# Patient Record
Sex: Female | Born: 1935
Health system: Southern US, Community
[De-identification: ages and names within clinical notes are randomized; demographics above are authoritative.]

## PROBLEM LIST (undated history)

## (undated) DIAGNOSIS — I2699 Other pulmonary embolism without acute cor pulmonale: Secondary | ICD-10-CM

## (undated) DIAGNOSIS — I4821 Permanent atrial fibrillation: Secondary | ICD-10-CM

## (undated) DIAGNOSIS — I499 Cardiac arrhythmia, unspecified: Secondary | ICD-10-CM

## (undated) DIAGNOSIS — I5032 Chronic diastolic (congestive) heart failure: Secondary | ICD-10-CM

## (undated) DIAGNOSIS — I350 Nonrheumatic aortic (valve) stenosis: Secondary | ICD-10-CM

## (undated) DIAGNOSIS — I10 Essential (primary) hypertension: Secondary | ICD-10-CM

## (undated) DIAGNOSIS — E785 Hyperlipidemia, unspecified: Secondary | ICD-10-CM

## (undated) DIAGNOSIS — I82409 Acute embolism and thrombosis of unspecified deep veins of unspecified lower extremity: Secondary | ICD-10-CM

## (undated) DIAGNOSIS — K449 Diaphragmatic hernia without obstruction or gangrene: Secondary | ICD-10-CM

## (undated) HISTORY — DX: Chronic diastolic (congestive) heart failure: I50.32

## (undated) HISTORY — DX: Nonrheumatic aortic (valve) stenosis: I35.0

## (undated) HISTORY — PX: CHOLECYSTECTOMY: SHX55

## (undated) HISTORY — PX: ABDOMINAL HYSTERECTOMY: SHX81

## (undated) HISTORY — PX: CATARACT EXTRACTION: SUR2

## (undated) HISTORY — DX: Permanent atrial fibrillation: I48.21

---

## 2001-02-14 ENCOUNTER — Other Ambulatory Visit: Admission: RE | Admit: 2001-02-14 | Discharge: 2001-02-14 | Payer: Self-pay | Admitting: Family Medicine

## 2001-02-27 ENCOUNTER — Encounter: Payer: Self-pay | Admitting: Family Medicine

## 2001-02-27 ENCOUNTER — Encounter: Admission: RE | Admit: 2001-02-27 | Discharge: 2001-02-27 | Payer: Self-pay | Admitting: Family Medicine

## 2005-06-29 ENCOUNTER — Emergency Department (HOSPITAL_COMMUNITY): Admission: EM | Admit: 2005-06-29 | Discharge: 2005-06-29 | Payer: Self-pay | Admitting: *Deleted

## 2005-08-18 ENCOUNTER — Inpatient Hospital Stay (HOSPITAL_COMMUNITY): Admission: EM | Admit: 2005-08-18 | Discharge: 2005-08-21 | Payer: Self-pay | Admitting: Emergency Medicine

## 2005-10-15 ENCOUNTER — Other Ambulatory Visit: Admission: RE | Admit: 2005-10-15 | Discharge: 2005-10-15 | Payer: Self-pay | Admitting: Family Medicine

## 2005-10-24 ENCOUNTER — Encounter: Admission: RE | Admit: 2005-10-24 | Discharge: 2005-10-24 | Payer: Self-pay | Admitting: Family Medicine

## 2006-12-06 ENCOUNTER — Encounter: Admission: RE | Admit: 2006-12-06 | Discharge: 2006-12-06 | Payer: Self-pay | Admitting: Family Medicine

## 2007-09-05 ENCOUNTER — Emergency Department (HOSPITAL_COMMUNITY): Admission: EM | Admit: 2007-09-05 | Discharge: 2007-09-06 | Payer: Self-pay | Admitting: Emergency Medicine

## 2007-12-08 ENCOUNTER — Encounter: Admission: RE | Admit: 2007-12-08 | Discharge: 2007-12-08 | Payer: Self-pay | Admitting: Family Medicine

## 2008-01-12 ENCOUNTER — Ambulatory Visit (HOSPITAL_COMMUNITY): Admission: RE | Admit: 2008-01-12 | Discharge: 2008-01-12 | Payer: Self-pay | Admitting: Family Medicine

## 2009-01-07 ENCOUNTER — Encounter: Admission: RE | Admit: 2009-01-07 | Discharge: 2009-01-07 | Payer: Self-pay | Admitting: Internal Medicine

## 2010-07-30 ENCOUNTER — Encounter: Payer: Self-pay | Admitting: Family Medicine

## 2010-11-24 NOTE — H&P (Signed)
Mary Rice, Mary Rice                 ACCOUNT NO.:  [id number]   MEDICAL RECORD NO.:  [id number]          PATIENT TYPE:  INP   LOCATION:  0108                         FACILITY:  [facility]   PHYSICIAN:  Renato Battles, M.D.     DATE OF BIRTH:  February 22, 1933   DATE OF ADMISSION:  08/18/2005  DATE OF DISCHARGE:                                HISTORY & PHYSICAL   REASON FOR ADMISSION:  Shortness of breath.   PRIMARY CARE PHYSICIAN:  Dr. Purnell Shoemaker in [place]. The patient is unassigned at  this hospital.   ORTHOPEDIST:  Dr. Thomasena Edis.   HISTORY OF PRESENT ILLNESS:  The patient is a very pleasant 77 year old  white female with shortness of breath for the last four days. No chest pain,  no cough. The patient has reported that she had right lower extremity  sprain and knee injury which rendered her bedridden since June 29, 2005.  She recently visited her orthopedist who advised her to start ambulating and  bear weight on her injured leg as of August 12, 2005. The patient developed  swelling of the right lower extremity on August 14, 2005 as well as  shortness of breath and presented to emergency department for further  evaluation and management. Initial emergency room workup revealed bilateral  pulmonary emboli by chest CT angiogram.   REVIEW OF SYSTEMS:  CONSTITUTIONAL: No fevers, chills or night sweats. No  weight changes. GI: No nausea, vomiting. Positive for mild constipation. No  diarrhea. GU: No dysuria, hematuria or retention. CARDIOPULMONARY: No chest  pain, no orthopnea, no PND, no cough.   PAST MEDICAL HISTORY:  None.   PAST SURGICAL HISTORY:  None.   FAMILY HISTORY:  Positive for hypertension.   SOCIAL HISTORY:  No tobacco, alcohol or drugs. She lives with her husband in  the [place] area.   MEDICATIONS:  None.   ALLERGIES:  SULFA.   PHYSICAL EXAMINATION:  GENERAL: The patient is alert and oriented x3 in no  acute distress.  VITAL SIGNS: Temperature is 98.2, heart rate 116,  respiratory rate 18, blood  pressure 146/66. O2 saturation 94% on room air.  HEENT: The head is normocephalic and atraumatic. Pupils are equal, round,  and reactive to light and accommodation.  LYMPHADENOPATHY: No thyromegaly and no JVD.  CHEST: Clear to auscultation bilaterally. No wheezing, rales or rhonchi.  HEART: Regular rhythm, tachycardia. No murmurs.  ABDOMEN: Obese, soft, nontender, and nondistended. Normal active bowel  sounds.  EXTREMITIES: No cyanosis or clubbing. Right lower extremity is swollen.   ASSESSMENT/PLAN:  1.  Bilateral pulmonary emboli as a result of embolization: Start the      patient on Lovenox 1 mg/kg subcutaneously q.12h. Coumadin is to be      started 24 hours after initiation of Lovenox. Workup for      hypercoagulability will be initiated at this point as well.  2.  Right lower extremity angioedema: I am going to check a Doppler of the      right lower extremity. Raise lower extremity at night and consult      physical therapy and occupational  therapy.  3.  Health maintenance: The patient has not been seeing physicians for a      long, long time. I am going to start her on low dose aspirin, check      fasting lipids and rule out myocardial infarction with cardiac enzymes.      Renato Battles, M.D.  Electronically Signed     SA/MEDQ  D:  08/18/2005  T:  08/18/2005  Job:  [id number]   cc:   Thomasena Edis, M.D.

## 2010-11-24 NOTE — Discharge Summary (Signed)
NAMECELESTINE, PRIM                 ACCOUNT NO.:  [id number]   MEDICAL RECORD NO.:  [id number]          PATIENT TYPE:  INP   LOCATION:  1418                         FACILITY[facility]   PHYSICIAN:  Hettie Holstein, D.O.    DATE OF BIRTH:  10-20-1932   DATE OF ADMISSION:  08/18/2005  DATE OF DISCHARGE:  08/21/2005                                 DISCHARGE SUMMARY   PRIMARY CARE PHYSICIAN:  Lianne Bushy, M.D.   REASON FOR ADMISSION:  Shortness of breath.   DISCHARGE DIAGNOSES:  1.  Bilateral pulmonary emboli, status post initiation of low molecular      weight heparin, a bridge to therapeutic INR.  2.  Status post posterior tibial plateau fracture as well as anterior      cruciate ligament and medial collateral ligament tear, sustained in      December, 2006.  Managed by Dr. Valma Cava with recurrent      disposition of partial weightbearing with 0-90 degree range of motion      and plans for rehab therapy in the outpatient setting, as directed by      Dr. Valma Cava.  She does have an appointment to follow up on March      4th.  3.  Hyperlipidemia with LDL this admission of 179.   DISPOSITION:  Ms. Muscat's hospitalization has been quite uneventful,  remaining hemodynamically stable and ambulatory with a walker.  Without  respiratory distress.  She is felt to be medically stable to be discharged  home with close followup with her primary care physician, Dr. Lianne Bushy.  She is being sent on a Lovenox bridge.  In addition, she is to follow up  with Dr. Valma Cava in March, as previously directed.  She is provided  our phone number to contact if she has any questions regarding this  hospitalization.   DISCHARGE MEDICATIONS:  1.  Coumadin 5 mg daily, to be adjusted by her primary care physician, Dr.      Purnell Shoemaker.  She has a 10 a.m. appointment on Friday.  I contacted his office      and has notified them of his need to follow her up with regards to her      Coumadin  therapy.  2.  Lovenox 150 mg subcu q.24h. until her INR is therapeutic x48 hours, in      the 2-3 range.  She is instructed to have a PT/INR drawn on Thursday and      follow up with her primary care physician on Friday.  3.  Oxycodone 5 mg every 4 hours as needed for moderate pain.  4.  Tylenol 650 mg every 4 hours as needed for mild pain.  5.  Zocor 20 mg daily.  This was initiated at admission.  Her baseline LFTs      obtained at admission were within normal limits.  This can be followed      at the discretion of her primary care physician.  6.  Senokot daily, to hold if she develops diarrhea.   HISTORY OF PRESENT ILLNESS:  For full details, please refer to the H&P as  dictated by Dr. Renato Battles, but briefly, Ms. Gerlich is a pleasant 77-year-  old female who sustained posterior tibial plateau fracture late in December  and has been bedridden since that time.  Recently had seen her orthopedic  physician and reports some swelling of her lower extremity.  In any event,  it was discovered that she had bilateral pulmonary emboli in the emergency  department, as she had presented with shortness of breath, and with this  history and findings of suspicion.   HOSPITAL COURSE:  She was admitted and initiated on heparin therapy and  eventually transitioned to low molecular weight heparin and Coumadin.  She  is currently bridging to a therapeutic INR.  She has remained  hemodynamically stable throughout her hospital course, and respiratory  distress has resolved.  She does look quite well at this time.  We are going  to discharge her home with close followup with her primary care physician.  Have instructed her that if she feels any progressive shortness of breath or  cough, she should return to [facility] or [facility] emergency department.   LABORATORY DATA:  Her INR on the date of discharge was 1.3.  She has been on  Coumadin 5 mg q.h.s. since admission.  Will be following up with primary   care physician to further titrate this dose.  Discontinue Lovenox when  appropriate.      Hettie Holstein, D.O.  Electronically Signed     ESS/MEDQ  D:  08/21/2005  T:  08/21/2005  Job:  [id number]   cc:   Lianne Bushy, M.D.  Fax: [phone]

## 2010-12-05 ENCOUNTER — Other Ambulatory Visit: Payer: Self-pay | Admitting: Internal Medicine

## 2010-12-05 DIAGNOSIS — Z1231 Encounter for screening mammogram for malignant neoplasm of breast: Secondary | ICD-10-CM

## 2010-12-11 ENCOUNTER — Ambulatory Visit
Admission: RE | Admit: 2010-12-11 | Discharge: 2010-12-11 | Disposition: A | Discharge status: Home / Self Care | Payer: Medicare Other | Source: Ambulatory Visit | Attending: Internal Medicine | Admitting: Internal Medicine

## 2010-12-11 DIAGNOSIS — Z1231 Encounter for screening mammogram for malignant neoplasm of breast: Secondary | ICD-10-CM

## 2011-03-30 LAB — DIFFERENTIAL
Basophils Absolute: 0
Basophils Relative: 1
Eosinophils Relative: 1
Lymphs Abs: 0.8
Monocytes Absolute: 0.5

## 2011-03-30 LAB — CBC
Hemoglobin: 14.5
MCHC: 33.9
MCV: 90.3
RBC: 4.73
RDW: 13.6
WBC: 4

## 2011-03-30 LAB — BASIC METABOLIC PANEL
Calcium: 8.8
GFR calc non Af Amer: >60
Sodium: 135

## 2011-03-30 LAB — POCT CARDIAC MARKERS
CKMB, poc: <1 — ABNORMAL LOW
Operator id: 1192
Troponin i, poc: <0.05

## 2011-12-25 ENCOUNTER — Other Ambulatory Visit: Payer: Self-pay | Admitting: Internal Medicine

## 2011-12-25 DIAGNOSIS — Z1231 Encounter for screening mammogram for malignant neoplasm of breast: Secondary | ICD-10-CM

## 2012-01-16 ENCOUNTER — Ambulatory Visit
Admission: RE | Admit: 2012-01-16 | Discharge: 2012-01-16 | Disposition: A | Discharge status: Home / Self Care | Payer: Medicare Other | Source: Ambulatory Visit | Attending: Internal Medicine | Admitting: Internal Medicine

## 2012-01-16 DIAGNOSIS — Z1231 Encounter for screening mammogram for malignant neoplasm of breast: Secondary | ICD-10-CM

## 2012-12-08 ENCOUNTER — Other Ambulatory Visit: Payer: Self-pay

## 2012-12-08 DIAGNOSIS — Z1231 Encounter for screening mammogram for malignant neoplasm of breast: Secondary | ICD-10-CM

## 2013-01-16 ENCOUNTER — Ambulatory Visit
Admission: RE | Admit: 2013-01-16 | Discharge: 2013-01-16 | Disposition: A | Discharge status: Home / Self Care | Payer: Medicare Other | Source: Ambulatory Visit

## 2013-01-16 DIAGNOSIS — Z1231 Encounter for screening mammogram for malignant neoplasm of breast: Secondary | ICD-10-CM

## 2013-12-16 ENCOUNTER — Other Ambulatory Visit: Payer: Self-pay

## 2013-12-16 DIAGNOSIS — Z1231 Encounter for screening mammogram for malignant neoplasm of breast: Secondary | ICD-10-CM

## 2014-01-18 ENCOUNTER — Ambulatory Visit
Admission: RE | Admit: 2014-01-18 | Discharge: 2014-01-18 | Disposition: A | Discharge status: Home / Self Care | Payer: Medicare Other | Source: Ambulatory Visit

## 2014-01-18 DIAGNOSIS — Z1231 Encounter for screening mammogram for malignant neoplasm of breast: Secondary | ICD-10-CM

## 2014-03-04 ENCOUNTER — Ambulatory Visit: Payer: Self-pay | Admitting: Podiatry

## 2014-08-27 ENCOUNTER — Ambulatory Visit: Payer: Self-pay | Admitting: Ophthalmology

## 2014-08-27 DIAGNOSIS — I10 Essential (primary) hypertension: Secondary | ICD-10-CM

## 2014-09-02 ENCOUNTER — Ambulatory Visit: Payer: Self-pay | Admitting: Ophthalmology

## 2014-10-14 ENCOUNTER — Ambulatory Visit
Admit: 2014-10-14 | Disposition: A | Discharge status: Home / Self Care | Payer: Self-pay | Attending: Ophthalmology | Admitting: Ophthalmology

## 2014-10-14 LAB — POTASSIUM: Potassium: 4.4 mmol/L

## 2014-10-21 ENCOUNTER — Ambulatory Visit
Admit: 2014-10-21 | Disposition: A | Discharge status: Home / Self Care | Payer: Self-pay | Attending: Ophthalmology | Admitting: Ophthalmology

## 2014-11-07 NOTE — Op Note (Signed)
PATIENT NAME:  Mary Rice, Mary Rice MR#:  [id number] DATE OF BIRTH:  [birth year]  DATE OF PROCEDURE:  10/21/2014  PREOPERATIVE DIAGNOSIS:  Nuclear sclerotic cataract, left eye, diagnosis code H25.12.   POSTOPERATIVE DIAGNOSIS:  Nuclear sclerotic cataract, left eye, diagnosis code H25.12.  PROCEDURE:  Phacoemulsification with posterior chamber intraocular lens left eye, model SN60WF.    SURGEON:  Lyla Glassing, MD  INDICATIONS:  This is a 81 year old female with decreased vision in the left eye.  PROCEDURE:  The risks and benefits of cataract surgery were discussed at length with the patient, including bleeding, infection, retinal detachment, re-operation, diplopia, ptosis, loss of vision, and loss of the eye. Informed consent was obtained. On the day of surgery, several sets of preoperative medication were administered to the operative eye including 0.5% tetracaine,1% cyclopentolate, 10% phenylephrine, 0.5% ketorolac, 0.5% gatifloxacin, and 2% lidocaine .  The patient was taken to the operating room and sedated via IV sedation. Topical tetracaine was placed in the eye. The operative eye was prepped using a diluted 10% Betadine solution and then covered in sterile drapes leaving only the operative eye exposed. A Lieberman lid speculum was placed to provide exposure. Using 0.12 forceps and a sideport blade, a paracentesis was created. Then a mixture of BSS, preservative free lidocaine, and epinephrine was injected into the anterior chamber. Next, a 2.4 mm keratome blade was used to create a two-step full-thickness clear corneal incision temporally. The cystitome and Utrata forceps were used to create a continuous capsulorrhexis in the anterior lens capsule. BSS on a hydrodissection cannula was used to perform gentle hydrodissection. Phacoemulsification was then performed to remove the nucleus. Irrigation and aspiration was performed to remove the remaining cortical material. Provisc was injected to fill the  capsular bag and anterior chamber. A 25.5 diopter SN60WF intraocular lens was injected into the capsular bag. The Connor wand was used to rotate it into proper position in the capsular bag. Irrigation and aspiration was performed to remove the remaining Viscoelastic material from the eye. BSS on a 30-gauge cannula was used to hydrate the wound. An intracameral antibiotic was administered. The wounds were checked and found to be watertight. The lid speculum and drapes were carefully removed. Several drops of Vigamox were placed in the operative eye. The eye was covered with protective eyewear. The patient was taken to the recovery area in good condition. There were no complications.   ____________________________ Lyla Glassing, MD nm:bu D: 10/21/2014 12:00:11 ET T: 10/21/2014 13:57:47 ET JOB#: [id number]  cc: Lyla Glassing, MD, <Dictator> Lyla Glassing MD ELECTRONICALLY SIGNED 10/28/2014 8:44

## 2014-11-07 NOTE — Op Note (Signed)
PATIENT NAME:  Mary Rice, Mary Rice MR#:  [id number] DATE OF BIRTH:  Jun 14, 1933  DATE OF PROCEDURE:  09/02/2014  PREOPERATIVE DIAGNOSIS:  Nuclear sclerotic cataract, right eye.  POSTOPERATIVE DIAGNOSIS:  Nuclear sclerotic cataract, right eye.  PROCEDURE:  Phacoemulsification with posterior chamber intraocular lens right eye, model SN60WF  SURGEON:  Lyla Glassing, MD  INDICATIONS:  This is a 81 year old female with decreased vision in the right eye.  PROCEDURE:  The risks and benefits of cataract surgery were discussed at length with the patient, including bleeding, infection, retinal detachment, re-operation, diplopia, ptosis, loss of vision, and loss of the eye. Informed consent was obtained. On the day of surgery, several sets of preoperative medication were administered to the operative eye including 0.5% tetracaine,1% cyclopentolate, 10% phenylephrine, 0.5% ketorolac, 0.5% gatifloxacin, and 2% lidocaine .  The patient was taken to the operating room and sedated via IV sedation. Topical tetracaine was placed in the eye. The operative eye was prepped using a diluted 10% Betadine solution and then covered in sterile drapes leaving only the operative eye exposed. A Lieberman lid speculum was placed to provide exposure. Using 0.12 forceps and a sideport blade, a paracentesis was created. Then a mixture of BSS, preservative free lidocaine, and epinephrine was injected into the anterior chamber. Next, a 2.4 mm keratome blade was used to create a two-step full-thickness clear corneal incision temporally. The cystitome and Utrata forceps were used to create a continuous capsulorrhexis in the anterior lens capsule. BSS on a hydrodissection cannula was used to perform gentle hydrodissection. Phacoemulsification was then performed to remove the nucleus. Irrigation and aspiration was performed to remove the remaining cortical material. Provisc was injected to fill the capsular bag and anterior chamber. A 26.5  diopter SN60WF intraocular lens was injected into the capsular bag. The Connor wand was used to rotate it into proper position in the capsular bag. Irrigation and aspiration was performed to remove the remaining Viscoelastic material from the eye. BSS on a 30-gauge cannula was used to hydrate the wound. An intracameral antibiotic was administered. The wounds were checked and found to be watertight. The lid speculum and drapes were carefully removed. Several drops of Vigamox were placed in the operative eye. The eye was covered with protective eyewear. The patient was taken to the recovery area in good condition. There were no complications.  DIAGNOSIS CODE: H25.11, nuclear sclerotic cataract of the right eye.   ____________________________ Lyla Glassing, MD nm:am D: 09/02/2014 10:38:47 ET T: 09/02/2014 22:27:53 ET JOB#: [id number]  cc: Lyla Glassing, MD, <Dictator> Lyla Glassing MD ELECTRONICALLY SIGNED 09/09/2014 11:56

## 2014-12-16 ENCOUNTER — Other Ambulatory Visit: Payer: Self-pay

## 2014-12-16 DIAGNOSIS — Z1231 Encounter for screening mammogram for malignant neoplasm of breast: Secondary | ICD-10-CM

## 2015-01-14 ENCOUNTER — Ambulatory Visit
Admission: RE | Admit: 2015-01-14 | Discharge: 2015-01-14 | Disposition: A | Discharge status: Home / Self Care | Payer: Medicare Other | Source: Ambulatory Visit

## 2015-01-14 DIAGNOSIS — Z1231 Encounter for screening mammogram for malignant neoplasm of breast: Secondary | ICD-10-CM

## 2015-08-19 ENCOUNTER — Emergency Department (INDEPENDENT_AMBULATORY_CARE_PROVIDER_SITE_OTHER)
Admission: EM | Admit: 2015-08-19 | Discharge: 2015-08-19 | Disposition: A | Discharge status: Nursing Home (Includes ICF) | Payer: Medicare Other | Source: Home / Self Care | Attending: Emergency Medicine | Admitting: Emergency Medicine

## 2015-08-19 ENCOUNTER — Encounter (HOSPITAL_COMMUNITY)
Admission: EM | Disposition: A | Discharge status: Home / Self Care | Payer: Self-pay | Source: Home / Self Care | Attending: Emergency Medicine

## 2015-08-19 ENCOUNTER — Encounter (HOSPITAL_COMMUNITY): Payer: Self-pay | Admitting: *Deleted

## 2015-08-19 ENCOUNTER — Ambulatory Visit (HOSPITAL_COMMUNITY)
Admission: EM | Admit: 2015-08-19 | Discharge: 2015-08-19 | Disposition: A | Discharge status: Home / Self Care | Payer: Medicare Other | Attending: Emergency Medicine | Admitting: Emergency Medicine

## 2015-08-19 ENCOUNTER — Encounter (HOSPITAL_COMMUNITY): Payer: Self-pay | Admitting: Emergency Medicine

## 2015-08-19 DIAGNOSIS — X58XXXA Exposure to other specified factors, initial encounter: Secondary | ICD-10-CM | POA: Insufficient documentation

## 2015-08-19 DIAGNOSIS — Z86711 Personal history of pulmonary embolism: Secondary | ICD-10-CM | POA: Insufficient documentation

## 2015-08-19 DIAGNOSIS — Z86718 Personal history of other venous thrombosis and embolism: Secondary | ICD-10-CM | POA: Diagnosis not present

## 2015-08-19 DIAGNOSIS — T18128A Food in esophagus causing other injury, initial encounter: Secondary | ICD-10-CM | POA: Insufficient documentation

## 2015-08-19 DIAGNOSIS — T189XXA Foreign body of alimentary tract, part unspecified, initial encounter: Secondary | ICD-10-CM | POA: Diagnosis not present

## 2015-08-19 DIAGNOSIS — I10 Essential (primary) hypertension: Secondary | ICD-10-CM | POA: Diagnosis not present

## 2015-08-19 DIAGNOSIS — Z7982 Long term (current) use of aspirin: Secondary | ICD-10-CM | POA: Insufficient documentation

## 2015-08-19 DIAGNOSIS — E785 Hyperlipidemia, unspecified: Secondary | ICD-10-CM | POA: Diagnosis not present

## 2015-08-19 HISTORY — DX: Acute embolism and thrombosis of unspecified deep veins of unspecified lower extremity: I82.409

## 2015-08-19 HISTORY — PX: ESOPHAGOGASTRODUODENOSCOPY: SHX5428

## 2015-08-19 HISTORY — DX: Other pulmonary embolism without acute cor pulmonale: I26.99

## 2015-08-19 HISTORY — DX: Diaphragmatic hernia without obstruction or gangrene: K44.9

## 2015-08-19 HISTORY — DX: Essential (primary) hypertension: I10

## 2015-08-19 HISTORY — DX: Hyperlipidemia, unspecified: E78.5

## 2015-08-19 LAB — BASIC METABOLIC PANEL
ANION GAP: 10 (ref 5–15)
BUN: 15 mg/dL (ref 6–20)
CHLORIDE: 101 mmol/L (ref 101–111)
CO2: 27 mmol/L (ref 22–32)
CREATININE: 1.15 mg/dL — AB (ref 0.44–1.00)
Calcium: 9.5 mg/dL (ref 8.9–10.3)
GFR calc non Af Amer: 44 mL/min — ABNORMAL LOW (ref >60)
GFR, EST AFRICAN AMERICAN: 51 mL/min — AB (ref >60)
Glucose, Bld: 127 mg/dL — ABNORMAL HIGH (ref 65–99)
POTASSIUM: 5.3 mmol/L — AB (ref 3.5–5.1)
SODIUM: 138 mmol/L (ref 135–145)

## 2015-08-19 LAB — CBC WITH DIFFERENTIAL/PLATELET
BASOS PCT: 1 %
Basophils Absolute: 0.1 10*3/uL (ref 0.0–0.1)
EOS ABS: 0.2 10*3/uL (ref 0.0–0.7)
Eosinophils Relative: 2 %
HCT: 44.5 % (ref 36.0–46.0)
Hemoglobin: 15.3 g/dL — ABNORMAL HIGH (ref 12.0–15.0)
Lymphocytes Relative: 12 %
Lymphs Abs: 1 10*3/uL (ref 0.7–4.0)
MCH: 32 pg (ref 26.0–34.0)
MCHC: 34.4 g/dL (ref 30.0–36.0)
MCV: 93.1 fL (ref 78.0–100.0)
MONOS PCT: 9 %
Monocytes Absolute: 0.7 10*3/uL (ref 0.1–1.0)
NEUTROS PCT: 76 %
Neutro Abs: 6.4 10*3/uL (ref 1.7–7.7)
PLATELETS: 184 10*3/uL (ref 150–400)
RBC: 4.78 MIL/uL (ref 3.87–5.11)
RDW: 13.2 % (ref 11.5–15.5)
WBC: 8.3 10*3/uL (ref 4.0–10.5)

## 2015-08-19 SURGERY — EGD (ESOPHAGOGASTRODUODENOSCOPY)
Anesthesia: Moderate Sedation

## 2015-08-19 MED ORDER — MIDAZOLAM HCL 10 MG/2ML IJ SOLN
INTRAMUSCULAR | Status: DC | PRN
  Administered 2015-08-19 (×2): 2 mg via INTRAVENOUS

## 2015-08-19 MED ORDER — FENTANYL CITRATE (PF) 100 MCG/2ML IJ SOLN
INTRAMUSCULAR | Status: DC | PRN
  Administered 2015-08-19 (×2): 25 ug via INTRAVENOUS

## 2015-08-19 MED ORDER — MIDAZOLAM HCL 5 MG/ML IJ SOLN
INTRAMUSCULAR | Status: AC
  Filled 2015-08-19: qty 2

## 2015-08-19 MED ORDER — FENTANYL CITRATE (PF) 100 MCG/2ML IJ SOLN
INTRAMUSCULAR | Status: AC
  Filled 2015-08-19: qty 2

## 2015-08-19 NOTE — ED Notes (Signed)
No airway obstruction or difficulty

## 2015-08-19 NOTE — ED Provider Notes (Signed)
CSN: [id number]     Arrival date & time 08/19/15  1750 History   First MD Initiated Contact with Patient 08/19/15 1837     Chief Complaint  Patient presents with  . Aspiration   (Consider location/radiation/quality/duration/timing/severity/associated sxs/prior Treatment) HPI  She is an 82 year old woman here for evaluation of globus sensation. She states she is eating a piece of steak this afternoon at 3 PM when she choked on a piece. She is able to breathe without difficulty, but has had difficulty swallowing since then. She states she has to spit frequently. She has tried eating and drinking and states it comes back up. She states it feels like it is stuck just above the sternal notch.  Past Medical History  Diagnosis Date  . Hypertension   . Hyperlipemia   . Hiatal hernia   . Pulmonary embolism ([place])   . DVT (deep venous thrombosis) [facility])    Past Surgical History  Procedure Laterality Date  . Abdominal hysterectomy    . Cholecystectomy    . Cataract extraction     History reviewed. No pertinent family history. Social History  Substance Use Topics  . Smoking status: Never Smoker   . Smokeless tobacco: None  . Alcohol Use: No   OB History    No data available     Review of Systems As in history of present illness Allergies  Sulfa antibiotics  Home Medications   Prior to Admission medications   Medication Sig Start Date End Date Taking? Authorizing Provider  aspirin 81 MG tablet Take 81 mg by mouth daily.   Yes Historical Provider, MD  atenolol (TENORMIN) 25 MG tablet Take by mouth daily.   Yes Historical Provider, MD  furosemide (LASIX) 20 MG tablet Take 20 mg by mouth as needed.   Yes Historical Provider, MD  simvastatin (ZOCOR) 80 MG tablet Take 80 mg by mouth daily.   Yes Historical Provider, MD  telmisartan (MICARDIS) 80 MG tablet Take 80 mg by mouth daily.   Yes Historical Provider, MD   Meds Ordered and Administered this Visit  Medications - No data to  display  BP 192/97 mmHg  Pulse 80  Temp(Src) 98.1 F (36.7 C) (Oral)  Resp 16  SpO2 97% No data found.   Physical Exam  Constitutional: She is oriented to person, place, and time. She appears well-developed and well-nourished. No distress.  HENT:  Oropharynx nonerythematous and widely patent. No foreign body seen.  Neck: Neck supple.  Cardiovascular: Normal rate.   Pulmonary/Chest: Effort normal.  Neurological: She is alert and oriented to person, place, and time.    ED Course  Procedures (including critical care time)  Labs Review Labs Reviewed - No data to display  Imaging Review No results found.   MDM   1. Swallowed foreign body, initial encounter    Called and spoke with GI physician on call, Dr. Silverio Decamp, who recommends transfer to [facility] ER for treatment with glucagon versus endoscopy. She will plan on seeing the patient in the ER. Discussed with patient and her daughter. They will drive themselves to the emergency room.  She is stable without any respiratory compromise.    Melony Overly, MD 08/19/15 [phone]

## 2015-08-19 NOTE — Op Note (Signed)
Brief Op report (EGD)  Mucosal tear with minimal oozing in the distal esophagus noted near EG junction likely the site of food impaction. No evidence of obstructed food bolus in the esophagus. Stomach filled with fluid and food, scope was not advanced further due to risk of aspiration.   Recommendations:  Protonix 40mg  daily, 30 mins before breakfast Carafate before meals as needed Will arrange for repeat EGD with possible dilation in 4-6 weeks at [facility] Avoid meat and bread until the repeat EGD with dilation Ok to discharge home

## 2015-08-19 NOTE — ED Notes (Addendum)
Pt taken back to room and into a gown with this RN. GI MD and RN in room for procedure. IV supplies given to endoscopy RN. Pt signed consent for procedure and placed at bedside. Airway in tact.

## 2015-08-19 NOTE — ED Notes (Signed)
Pt verbalized understanding of d/c instructions and has no further questions. Pt stable and NAD. Pt d/c home with daughter driving. GCS 15 and pt is alert and oriented x 4.

## 2015-08-19 NOTE — ED Notes (Signed)
The pt has had steak stuck in her esophagus since 1500 today  She has had problems in the past swallowing  Food  But never unable to get the food stuck unstuck

## 2015-08-19 NOTE — ED Notes (Signed)
Endoscopy complete. Pt is to be monitored in the ED and the plan is to d/c home with daughter.

## 2015-08-19 NOTE — ED Provider Notes (Signed)
MSE was initiated and I personally evaluated the patient and placed orders (if any) at  9:18 PM on August 19, 2015.  The patient appears stable so that the remainder of the MSE may be completed by another provider.  Sent from urgent care to see GI. Has reported meat impaction. Awake and pleasant. Dr. Silverio Decamp will see the patient in the ER.  Davonna Belling, MD 08/19/15 2118

## 2015-08-19 NOTE — ED Notes (Signed)
The patient presented to the [facility] with a complaint of a possible food bolus (steak) stuck in her throat since 3 pm today. The patient denied any respiratory compromise.

## 2015-08-19 NOTE — ED Notes (Signed)
Pt attempted to swallow water and stated "it's painful to swallow the piece of steak is still there"

## 2015-08-19 NOTE — H&P (Signed)
[facility] History and Physical   Primary Care Physician:  No primary care provider on file.   Reason for Procedure:  Food impaction Plan:    EGD with possible intervention    HPI: Mary Rice is a 82 y.o. female presented to ER with c/o chocking on a piece of steak. She initially was having trouble with handling secretions but appears to be able saliva and water but continues to have some discomfort in the chest. Will proceed with EGD to evaluate and disimpact. The risks and benefits as well as alternatives of endoscopic procedure(s) have been discussed and reviewed. All questions answered. The patient agrees to proceed. Denies any nausea,  abdominal pain, melena or bright red blood per rectum     Past Medical History  Diagnosis Date  . Hypertension   . Hyperlipemia   . Hiatal hernia   . Pulmonary embolism ([place])   . DVT (deep venous thrombosis) [facility])     Past Surgical History  Procedure Laterality Date  . Abdominal hysterectomy    . Cholecystectomy    . Cataract extraction      Prior to Admission medications   Medication Sig Start Date End Date Taking? Authorizing Provider  aspirin 81 MG tablet Take 81 mg by mouth daily.    Historical Provider, MD  atenolol (TENORMIN) 25 MG tablet Take by mouth daily.    Historical Provider, MD  furosemide (LASIX) 20 MG tablet Take 20 mg by mouth as needed.    Historical Provider, MD  simvastatin (ZOCOR) 80 MG tablet Take 80 mg by mouth daily.    Historical Provider, MD  telmisartan (MICARDIS) 80 MG tablet Take 80 mg by mouth daily.    Historical Provider, MD    No current facility-administered medications for this encounter.   Current Outpatient Prescriptions  Medication Sig Dispense Refill  . aspirin 81 MG tablet Take 81 mg by mouth daily.    Marland Kitchen atenolol (TENORMIN) 25 MG tablet Take by mouth daily.    . furosemide (LASIX) 20 MG tablet Take 20 mg by mouth as needed.    . simvastatin (ZOCOR) 80 MG tablet Take 80 mg by  mouth daily.    Marland Kitchen telmisartan (MICARDIS) 80 MG tablet Take 80 mg by mouth daily.      Allergies as of 08/19/2015 - Review Complete 08/19/2015  Allergen Reaction Noted  . Sulfa antibiotics  08/19/2015    No family history on file.  Social History   Social History  . Marital Status: Married    Spouse Name: N/A  . Number of Children: N/A  . Years of Education: N/A   Occupational History  . Not on file.   Social History Main Topics  . Smoking status: Never Smoker   . Smokeless tobacco: Not on file  . Alcohol Use: No  . Drug Use: Not on file  . Sexual Activity: Not on file   Other Topics Concern  . Not on file   Social History Narrative    Review of Systems:  All other review of systems negative except as mentioned in the HPI.  Physical Exam: Vital signs in last 24 hours: Temp:  [98.1 F (36.7 C)] 98.1 F (36.7 C) (02/10 2013) Pulse Rate:  [80-95] 95 (02/10 2013) Resp:  [16] 16 (02/10 2013) BP: (153-192)/(88-97) 153/88 mmHg (02/10 2013) SpO2:  [97 %] 97 % (02/10 2013)   General:   Alert,  Well-developed, well-nourished, pleasant and cooperative in NAD Lungs:  Clear throughout to auscultation.  Heart:  Regular rate and rhythm; no murmurs, clicks, rubs,  or gallops. Abdomen:  Soft, nontender and nondistended. Normal bowel sounds.   Neuro/Psych:  Alert and cooperative. Normal mood and affect. A and O x 3   @K .Denzil Magnuson, MD [facility] [phone] (pager) 08/19/2015 9:27 PM@

## 2015-08-19 NOTE — ED Notes (Signed)
Water will not go down and stay down

## 2015-08-19 NOTE — ED Notes (Signed)
Patient came from Urgent Care.  They advised the patient has a "food bolus" and is gagging.  UC advised they called the GI doctor and they wanted glucagon given.  Patient is alert, oriented x4.

## 2015-08-22 ENCOUNTER — Telehealth: Payer: Self-pay

## 2015-08-22 ENCOUNTER — Encounter (HOSPITAL_COMMUNITY): Payer: Self-pay | Admitting: Gastroenterology

## 2015-08-22 NOTE — Telephone Encounter (Signed)
-----   Message from Mauri Pole, MD sent at 08/20/2015  6:15 PM EST ----- Needs repeat EGD in 4-6 weeks, ok to schedule at [facility]. Can you please have her follow up with extender prior to that.  Thanks [employer]

## 2015-08-22 NOTE — Telephone Encounter (Signed)
I have left message for the patient to call back. I have her scheduled to see Nicoletta Ba, PA-C on 09/09/15 at 3PM. I have made an appointment for her in the [facility] on 09/23/15 at [place].

## 2015-08-23 NOTE — Telephone Encounter (Signed)
ok 

## 2015-08-23 NOTE — Telephone Encounter (Signed)
Patient declines these appointments.

## 2015-09-09 ENCOUNTER — Ambulatory Visit: Payer: Medicare Other | Admitting: Physician Assistant

## 2015-09-23 ENCOUNTER — Encounter: Payer: Medicare Other | Admitting: Gastroenterology

## 2015-10-10 ENCOUNTER — Encounter (INDEPENDENT_AMBULATORY_CARE_PROVIDER_SITE_OTHER): Payer: Self-pay

## 2015-10-10 ENCOUNTER — Telehealth: Payer: Self-pay

## 2015-10-10 ENCOUNTER — Ambulatory Visit (INDEPENDENT_AMBULATORY_CARE_PROVIDER_SITE_OTHER): Payer: Medicare Other | Admitting: Cardiovascular Disease

## 2015-10-10 ENCOUNTER — Encounter: Payer: Self-pay | Admitting: Cardiovascular Disease

## 2015-10-10 VITALS — BP 100/60 | HR 60 | Ht 67.0 in | Wt 233.0 lb

## 2015-10-10 DIAGNOSIS — I10 Essential (primary) hypertension: Secondary | ICD-10-CM | POA: Diagnosis not present

## 2015-10-10 DIAGNOSIS — E669 Obesity, unspecified: Secondary | ICD-10-CM

## 2015-10-10 DIAGNOSIS — E785 Hyperlipidemia, unspecified: Secondary | ICD-10-CM | POA: Insufficient documentation

## 2015-10-10 DIAGNOSIS — I4891 Unspecified atrial fibrillation: Secondary | ICD-10-CM | POA: Insufficient documentation

## 2015-10-10 DIAGNOSIS — R0602 Shortness of breath: Secondary | ICD-10-CM | POA: Diagnosis not present

## 2015-10-10 MED ORDER — APIXABAN 5 MG PO TABS: 5.0000 mg | ORAL_TABLET | Freq: Two times a day (BID) | ORAL | Status: DC

## 2015-10-10 MED ORDER — OMEPRAZOLE 20 MG PO CPDR: 20.0000 mg | DELAYED_RELEASE_CAPSULE | Freq: Every day | ORAL | Status: DC

## 2015-10-10 NOTE — Progress Notes (Signed)
Patient ID: Mary Rice, female    DOB: 1933/04/13, 82 y.o.   MRN: [id number]  HPI Comments: Mary Rice is a pleasant 82 year old woman, referred to the cardiology clinic by Dr. Emily Filbert for evaluation of paroxysmal atrial fibrillation. She has a history of reactive airway disease, hypertension, DVT with PE after a patella fracture  She reports having EGD 08/19/2015 and daughter reports that on the monitor heart rate was going between 60 and 80, seemed to go up and down She was seen 09/20/2015 by Dr. Sabra Heck, was told that EKG showed atrial fibrillation and was referred for follow-up Told to increase her aspirin from 81 mg up to 325 mg  She is sedentary, likes to sit in the middle of the couch all day No regular exercise program Weight has been trending upwards Tolerating beta blocker, ARB, cholesterol medication, blood pressure well controlled  Recent EGD showing gastritis, was told to start PPI She is only taking Pepcid  She denies any significant chest pain on exertion Has never been told that she has atrial fibrillation in the past Denies any symptoms of palpitations, reports that she is asymptomatic  She declined EKG on today's visit (clinical exam suggestive of sinus rhythm) EKG from Dr. Sabra Heck was faxed over, baseline artifact, difficult to determine but appears to show normal sinus rhythm with second-degree AV block Type I     Allergies  Allergen Reactions  . Sulfa Antibiotics Rash     Medication List       aspirin 325 MG tablet  Take 325 mg by mouth daily.     atenolol 25 MG tablet  Commonly known as:  TENORMIN  Take 25 mg by mouth daily.     CALCIUM 600 PO  Take by mouth daily.     hydrochlorothiazide 25 MG tablet  Commonly known as:  HYDRODIURIL  Take 25 mg by mouth daily.     multivitamin tablet  Take 1 tablet by mouth daily.     omeprazole 20 MG capsule  Commonly known as:  PRILOSEC  Take 1 capsule (20 mg total) by mouth daily.     simvastatin  80 MG tablet  Commonly known as:  ZOCOR  Take 80 mg by mouth daily.     telmisartan 80 MG tablet  Commonly known as:  MICARDIS  Take 80 mg by mouth daily.     Vitamin D 2000 units tablet  Take 2,000 Units by mouth daily.        Past Medical History  Diagnosis Date  . Hypertension   . Hyperlipemia   . Hiatal hernia   . Pulmonary embolism ([place])   . DVT (deep venous thrombosis) [facility])     Past Surgical History  Procedure Laterality Date  . Abdominal hysterectomy    . Cholecystectomy    . Cataract extraction    . Esophagogastroduodenoscopy N/A 08/19/2015    Procedure: ESOPHAGOGASTRODUODENOSCOPY (EGD);  Surgeon: Mauri Pole, MD;  Location: [facility] ENDOSCOPY;  Service: Endoscopy;  Laterality: N/A;    Social History  reports that she has never smoked. She does not have any smokeless tobacco history on file. She reports that she does not drink alcohol or use illicit drugs.  Family History family history includes Heart failure in her mother.   Review of Systems  Constitutional: Negative.   Respiratory: Negative.   Cardiovascular: Negative.   Gastrointestinal: Negative.   Musculoskeletal: Negative.   Neurological: Negative.   Hematological: Negative.   Psychiatric/Behavioral: Negative.   All other  systems reviewed and are negative.   BP 100/60 mmHg  Pulse 60  Ht 5\' 7"  (1.702 m)  Wt 233 lb (105.688 kg)  BMI 36.48 kg/m2  Physical Exam  Constitutional: She is oriented to person, place, and time. She appears well-developed and well-nourished.  HENT:  Head: Normocephalic.  Nose: Nose normal.  Mouth/Throat: Oropharynx is clear and moist.  Eyes: Conjunctivae are normal. Pupils are equal, round, and reactive to light.  Neck: Normal range of motion. Neck supple. No JVD present.  Cardiovascular: Normal rate, regular rhythm and intact distal pulses.  Exam reveals no gallop and no friction rub.   Murmur heard.  Systolic murmur is present with a grade of 1/6   Pulmonary/Chest: Effort normal and breath sounds normal. No respiratory distress. She has no wheezes. She has no rales. She exhibits no tenderness.  Abdominal: Soft. Bowel sounds are normal. She exhibits no distension. There is no tenderness.  Musculoskeletal: Normal range of motion. She exhibits no edema or tenderness.  Lymphadenopathy:    She has no cervical adenopathy.  Neurological: She is alert and oriented to person, place, and time. Coordination normal.  Skin: Skin is warm and dry. No rash noted. No erythema.  Psychiatric: She has a normal mood and affect. Her behavior is normal. Judgment and thought content normal.

## 2015-10-10 NOTE — Telephone Encounter (Signed)
Left message for Levada Dy to call back.  EKG from PCP does not show clear rhythm, Dr. Rockey Situ would like to repeat.

## 2015-10-10 NOTE — Patient Instructions (Addendum)
You are doing well. You are in atrial fibrillation  Omeprazole for stomach (acid reducer) Start eliquis one pill twice a day Stop the aspirin  Check the price of other blood thinner pills, Xarelto or pradaxa  We will order an echocardiogram for atrial fibrillation Echocardiography is a painless test that uses sound waves to create images of your heart. It provides your doctor with information about the size and shape of your heart and how well your heart's chambers and valves are working. This procedure takes approximately one hour. There are no restrictions for this procedure.  Date & time: ___________________________  We will wait for the EKG to confirm atrial fib before starting a blood thinner    Please call us if you have new issues that need to be addressed before your next appt.  Your physician wants you to follow-up in: 6 weeks You will receive a reminder letter in the mail two months in advance. If you don't receive a letter, please call our office to schedule the follow-up appointment.  Echocardiogram An echocardiogram, or echocardiography, uses sound waves (ultrasound) to produce an image of your heart. The echocardiogram is simple, painless, obtained within a short period of time, and offers valuable information to your health care provider. The images from an echocardiogram can provide information such as:  Evidence of coronary artery disease (CAD).  Heart size.  Heart muscle function.  Heart valve function.  Aneurysm detection.  Evidence of a past heart attack.  Fluid buildup around the heart.  Heart muscle thickening.  Assess heart valve function. LET [facility] CARE PROVIDER KNOW ABOUT:  Any allergies you have.  All medicines you are taking, including vitamins, herbs, eye drops, creams, and over-the-counter medicines.  Previous problems you or members of your family have had with the use of anesthetics.  Any blood disorders you have.  Previous  surgeries you have had.  Medical conditions you have.  Possibility of pregnancy, if this applies. BEFORE THE PROCEDURE  No special preparation is needed. Eat and drink normally.  PROCEDURE   In order to produce an image of your heart, gel will be applied to your chest and a wand-like tool (transducer) will be moved over your chest. The gel will help transmit the sound waves from the transducer. The sound waves will harmlessly bounce off your heart to allow the heart images to be captured in real-time motion. These images will then be recorded.  You may need an IV to receive a medicine that improves the quality of the pictures. AFTER THE PROCEDURE You may return to your normal schedule including diet, activities, and medicines, unless your health care provider tells you otherwise.   This information is not intended to replace advice given to you by your health care provider. Make sure you discuss any questions you have with your health care provider.   Document Released: 06/22/2000 Document Revised: 07/16/2014 Document Reviewed: 03/02/2013 Elsevier Interactive Patient Education 2016 Elsevier [employer] Atrial Fibrillation Atrial fibrillation is a type of irregular or rapid heartbeat (arrhythmia). In atrial fibrillation, the heart quivers continuously in a chaotic pattern. This occurs when parts of the heart receive disorganized signals that make the heart unable to pump blood normally. This can increase the risk for stroke, heart failure, and other heart-related conditions. There are different types of atrial fibrillation, including:  Paroxysmal atrial fibrillation. This type starts suddenly, and it usually stops on its own shortly after it starts.  Persistent atrial fibrillation. This type often lasts longer than a  week. It may stop on its own or with treatment.  Long-lasting persistent atrial fibrillation. This type lasts longer than 12 months.  Permanent atrial fibrillation. This type does  not go away. Talk with your health care provider to learn about the type of atrial fibrillation that you have. CAUSES This condition is caused by some heart-related conditions or procedures, including:  A heart attack.  Coronary artery disease.  Heart failure.  Heart valve conditions.  High blood pressure.  Inflammation of the sac that surrounds the heart (pericarditis).  Heart surgery.  Certain heart rhythm disorders, such as Wolf-Parkinson-White syndrome. Other causes include:  Pneumonia.  Obstructive sleep apnea.  Blockage of an artery in the lungs (pulmonary embolism, or PE).  Lung cancer.  Chronic lung disease.  Thyroid problems, especially if the thyroid is overactive (hyperthyroidism).  Caffeine.  Excessive alcohol use or illegal drug use.  Use of some medicines, including certain decongestants and diet pills. Sometimes, the cause cannot be found. RISK FACTORS This condition is more likely to develop in:  People who are older in age.  People who smoke.  People who have diabetes mellitus.  People who are overweight (obese).  Athletes who exercise vigorously. SYMPTOMS Symptoms of this condition include:  A feeling that your heart is beating rapidly or irregularly.  A feeling of discomfort or pain in your chest.  Shortness of breath.  Sudden light-headedness or weakness.  Getting tired easily during exercise. In some cases, there are no symptoms. DIAGNOSIS Your health care provider may be able to detect atrial fibrillation when taking your pulse. If detected, this condition may be diagnosed with:  An electrocardiogram (ECG).  A Holter monitor test that records your heartbeat patterns over a 24-hour period.  Transthoracic echocardiogram (TTE) to evaluate how blood flows through your heart.  Transesophageal echocardiogram (TEE) to view more detailed images of your heart.  A stress test.  Imaging tests, such as a CT scan or chest  X-ray.  Blood tests. TREATMENT The main goals of treatment are to prevent blood clots from forming and to keep your heart beating at a normal rate and rhythm. The type of treatment that you receive depends on many factors, such as your underlying medical conditions and how you feel when you are experiencing atrial fibrillation. This condition may be treated with:  Medicine to slow down the heart rate, bring the heart's rhythm back to normal, or prevent clots from forming.  Electrical cardioversion. This is a procedure that resets your heart's rhythm by delivering a controlled, low-energy shock to the heart through your skin.  Different types of ablation, such as catheter ablation, catheter ablation with pacemaker, or surgical ablation. These procedures destroy the heart tissues that send abnormal signals. When the pacemaker is used, it is placed under your skin to help your heart beat in a regular rhythm. HOME CARE INSTRUCTIONS  Take over-the counter and prescription medicines only as told by your health care provider.  If your health care provider prescribed a blood-thinning medicine (anticoagulant), take it exactly as told. Taking too much blood-thinning medicine can cause bleeding. If you do not take enough blood-thinning medicine, you will not have the protection that you need against stroke and other problems.  Do not use tobacco products, including cigarettes, chewing tobacco, and e-cigarettes. If you need help quitting, ask your health care provider.  If you have obstructive sleep apnea, manage your condition as told by your health care provider.  Do not drink alcohol.  Do not drink  beverages that contain caffeine, such as coffee, soda, and tea.  Maintain a healthy weight. Do not use diet pills unless your health care provider approves. Diet pills may make heart problems worse.  Follow diet instructions as told by your health care provider.  Exercise regularly as told by your  health care provider.  Keep all follow-up visits as told by your health care provider. This is important. PREVENTION  Avoid drinking beverages that contain caffeine or alcohol.  Avoid certain medicines, especially medicines that are used for breathing problems.  Avoid certain herbs and herbal medicines, such as those that contain ephedra or ginseng.  Do not use illegal drugs, such as cocaine and amphetamines.  Do not smoke.  Manage your high blood pressure. SEEK MEDICAL CARE IF:  You notice a change in the rate, rhythm, or strength of your heartbeat.  You are taking an anticoagulant and you notice increased bruising.  You tire more easily when you exercise or exert yourself. SEEK IMMEDIATE MEDICAL CARE IF:  You have chest pain, abdominal pain, sweating, or weakness.  You feel nauseous.  You notice blood in your vomit, bowel movement, or urine.  You have shortness of breath.  You suddenly have swollen feet and ankles.  You feel dizzy.  You have sudden weakness or numbness of the face, arm, or leg, especially on one side of the body.  You have trouble speaking, trouble understanding, or both (aphasia).  Your face or your eyelid droops on one side. These symptoms may represent a serious problem that is an emergency. Do not wait to see if the symptoms will go away. Get medical help right away. Call your local emergency services (911 in the [place] Do not drive yourself to the hospital.   This information is not intended to replace advice given to you by your health care provider. Make sure you discuss any questions you have with your health care provider.   Document Released: 06/25/2005 Document Revised: 03/16/2015 Document Reviewed: 10/20/2014 Elsevier Interactive Patient Education [employer].

## 2015-10-10 NOTE — Assessment & Plan Note (Signed)
Sedentary lifestyle, recommended a regular walking program, diet changes Recommended she follow a low carbohydrate diet   Total encounter time more than 45 minutes  Greater than 50% was spent in counseling and coordination of care with the patient

## 2015-10-10 NOTE — Assessment & Plan Note (Signed)
Cholesterol is at goal on the current lipid regimen. No changes to the medications were made.  

## 2015-10-10 NOTE — Telephone Encounter (Signed)
Pt is on her way back for an EKG and rhythm strip.

## 2015-10-10 NOTE — Assessment & Plan Note (Addendum)
Patient declined EKG on today's visit Clinical exam suggestive of normal sinus rhythm EKG requested from Dr. Sabra Heck. This arrived after the patient had left the clinic Review of EKG shows significant baseline artifact, P waves appear to be present in V5, rhythm concerning for normal sinus rhythm with second-degree AV block type I. We will request Xerox of the EKG from Dr. Sabra Heck We will discuss this with the patient, recommended that she consider repeat EKG in the office. Perhaps we can document similar second-degree AV block. If Xerox would recommend anticoagulation.  Long discussion today concerning atrial fibrillation, risk and benefit of being on a blood thinner She does not want warfarin, prefers taking eliquis.  No prescription written at this time until we confirm she had atrial fibrillation We did discuss possibly wearing a monitor, we'll discuss with her if needed   Addendum; patient came back into the office that afternoon, we did receive the EKG from Dr. Sabra Heck that suggested atrial fibrillation We did repeat EKG in the office that did confirm she is still in atrial fibrillation with ventricular rate 82 bpm, no significant ST or T-wave changes We have recommended she start eliquis 5 mg twice a day, will stop the aspirin We have recommended echocardiogram Follow-up in 6 weeks' time to discuss possible cardioversion. If she has no symptoms and prefers, at that time we would continue on anticoagulation with no cardioversion    Total encounter time more than 60 minutes  Greater than 50% was spent in counseling and coordination of care with the patient

## 2015-10-10 NOTE — Assessment & Plan Note (Signed)
Blood pressure is well controlled on today's visit. No changes made to the medications. 

## 2015-10-27 ENCOUNTER — Ambulatory Visit (INDEPENDENT_AMBULATORY_CARE_PROVIDER_SITE_OTHER): Payer: Medicare Other

## 2015-10-27 ENCOUNTER — Other Ambulatory Visit: Payer: Self-pay

## 2015-10-27 ENCOUNTER — Encounter (INDEPENDENT_AMBULATORY_CARE_PROVIDER_SITE_OTHER): Payer: Self-pay

## 2015-10-27 DIAGNOSIS — I4891 Unspecified atrial fibrillation: Secondary | ICD-10-CM

## 2015-10-27 DIAGNOSIS — R0602 Shortness of breath: Secondary | ICD-10-CM | POA: Diagnosis not present

## 2015-11-21 ENCOUNTER — Ambulatory Visit (INDEPENDENT_AMBULATORY_CARE_PROVIDER_SITE_OTHER): Payer: Medicare Other | Admitting: Cardiovascular Disease

## 2015-11-21 ENCOUNTER — Encounter: Payer: Self-pay | Admitting: Cardiovascular Disease

## 2015-11-21 VITALS — BP 128/68 | HR 57 | Ht 66.0 in | Wt 231.0 lb

## 2015-11-21 DIAGNOSIS — E669 Obesity, unspecified: Secondary | ICD-10-CM

## 2015-11-21 DIAGNOSIS — I4891 Unspecified atrial fibrillation: Secondary | ICD-10-CM | POA: Diagnosis not present

## 2015-11-21 DIAGNOSIS — I10 Essential (primary) hypertension: Secondary | ICD-10-CM

## 2015-11-21 DIAGNOSIS — R0602 Shortness of breath: Secondary | ICD-10-CM

## 2015-11-21 DIAGNOSIS — E785 Hyperlipidemia, unspecified: Secondary | ICD-10-CM

## 2015-11-21 NOTE — Assessment & Plan Note (Signed)
Previously reported having shortness of breath on her last clinic visit. Denies any symptoms on today's visit. Suspect she may need Lasix when necessary. She does not want her own prescription, reports that she will use her husband's. Recommended she watch for weight gain, leg swelling, abdominal bloating

## 2015-11-21 NOTE — Assessment & Plan Note (Signed)
We have encouraged continued exercise, careful diet management in an effort to lose weight. 

## 2015-11-21 NOTE — Assessment & Plan Note (Addendum)
Persistent atrial fibrillation Tolerating anticoagulation, rate well controlled Long discussion with her today concerning various treatment options for her atrial fibrillation. These include continued medical management as she is doing. Other option would be pharmacologic attempt at cardioversion, even DC cardioversion. She is not interested in the latter, prefers medical management at this time We have recommended that she think about her decision and contact our office if she changes her mind and would like to restore normal sinus rhythm  Long discussion concerning risk and benefit of anticoagulation and of staying in atrial fibrillation

## 2015-11-21 NOTE — Progress Notes (Signed)
Patient ID: Mary Rice, female    DOB: 09-08-1932, 82 y.o.   MRN: [id number]  HPI Comments: Mary Rice is a pleasant 82 year old woman, patient of Dr. Emily Filbert, who presents for follow-up evaluation of persistent atrial fibrillation. She has a history of reactive airway disease, hypertension, DVT with PE after a patella fracture  On her last clinic visit, she was in atrial fibrillation We started her on anticoagulation with follow-up today  She had EGD 08/19/2015 and daughter reports that on the monitor heart rate was going between 60 and 80, seemed to go up and down She was seen 09/20/2015 by Dr. Sabra Heck, was told that EKG showed atrial fibrillation and was referred for follow-up  At baseline she is sedentary, likes to sit in the middle of the couch  No regular exercise program Weight has been trending upwards  Recent EGD showing gastritis, was told to start PPI  She denies any significant chest pain on exertion Denies any symptoms of palpitations, reports that she is asymptomatic  EKG on today's visit shows atrial fibrillation with ventricular rate 57 bpm, no significant ST or T-wave changes     Allergies  Allergen Reactions  . Sulfa Antibiotics Rash     Medication List       This list is accurate as of: 11/21/15  1:54 PM.  Always use your most recent med list.               apixaban 5 MG Tabs tablet  Commonly known as:  ELIQUIS  Take 1 tablet (5 mg total) by mouth 2 (two) times daily.     atenolol 25 MG tablet  Commonly known as:  TENORMIN  Take 25 mg by mouth daily.     CALCIUM 600 PO  Take by mouth daily.     hydrochlorothiazide 25 MG tablet  Commonly known as:  HYDRODIURIL  Take 25 mg by mouth daily.     multivitamin tablet  Take 1 tablet by mouth daily.     omeprazole 20 MG capsule  Commonly known as:  PRILOSEC  Take 1 capsule (20 mg total) by mouth daily.     simvastatin 80 MG tablet  Commonly known as:  ZOCOR  Take 40 mg by mouth daily.      telmisartan 80 MG tablet  Commonly known as:  MICARDIS  Take 80 mg by mouth daily.     Vitamin D 2000 units tablet  Take 2,000 Units by mouth daily.        Past Medical History  Diagnosis Date  . Hypertension   . Hyperlipemia   . Hiatal hernia   . Pulmonary embolism ([place])   . DVT (deep venous thrombosis) [facility])     Past Surgical History  Procedure Laterality Date  . Abdominal hysterectomy    . Cholecystectomy    . Cataract extraction    . Esophagogastroduodenoscopy N/A 08/19/2015    Procedure: ESOPHAGOGASTRODUODENOSCOPY (EGD);  Surgeon: Mauri Pole, MD;  Location: [facility] ENDOSCOPY;  Service: Endoscopy;  Laterality: N/A;    Social History  reports that she has never smoked. She does not have any smokeless tobacco history on file. She reports that she does not drink alcohol or use illicit drugs.  Family History family history includes Heart failure in her mother.   Review of Systems  Constitutional: Negative.   Respiratory: Negative.   Cardiovascular: Negative.   Gastrointestinal: Negative.   Musculoskeletal: Negative.   Neurological: Negative.   Hematological: Negative.   Psychiatric/Behavioral:  Negative.   All other systems reviewed and are negative.   BP 128/68 mmHg  Pulse 57  Ht 5\' 6"  (1.676 m)  Wt 231 lb (104.781 kg)  BMI 37.30 kg/m2  Physical Exam  Constitutional: She is oriented to person, place, and time. She appears well-developed and well-nourished.  HENT:  Head: Normocephalic.  Nose: Nose normal.  Mouth/Throat: Oropharynx is clear and moist.  Eyes: Conjunctivae are normal. Pupils are equal, round, and reactive to light.  Neck: Normal range of motion. Neck supple. No JVD present.  Cardiovascular: Normal rate and intact distal pulses.  An irregularly irregular rhythm present. Exam reveals no gallop and no friction rub.   Murmur heard.  Systolic murmur is present with a grade of 1/6  Pulmonary/Chest: Effort normal and breath sounds normal. No  respiratory distress. She has no wheezes. She has no rales. She exhibits no tenderness.  Abdominal: Soft. Bowel sounds are normal. She exhibits no distension. There is no tenderness.  Musculoskeletal: Normal range of motion. She exhibits no edema or tenderness.  Lymphadenopathy:    She has no cervical adenopathy.  Neurological: She is alert and oriented to person, place, and time. Coordination normal.  Skin: Skin is warm and dry. No rash noted. No erythema.  Psychiatric: She has a normal mood and affect. Her behavior is normal. Judgment and thought content normal.

## 2015-11-21 NOTE — Patient Instructions (Signed)
You are doing well. No medication changes were made.  Please call us if you have new issues that need to be addressed before your next appt.  Your physician wants you to follow-up in: 6 months.  You will receive a reminder letter in the mail two months in advance. If you don't receive a letter, please call our office to schedule the follow-up appointment.   

## 2015-11-21 NOTE — Assessment & Plan Note (Signed)
Blood pressure is well controlled on today's visit. No changes made to the medications. 

## 2015-11-21 NOTE — Assessment & Plan Note (Signed)
Encouraged her to stay on her simvastatin   Total encounter time more than 25 minutes  Greater than 50% was spent in counseling and coordination of care with the patient

## 2015-12-08 ENCOUNTER — Telehealth: Payer: Self-pay | Admitting: Cardiovascular Disease

## 2015-12-08 ENCOUNTER — Emergency Department
Admission: EM | Admit: 2015-12-08 | Discharge: 2015-12-08 | Disposition: A | Discharge status: Home / Self Care | Payer: Medicare Other | Attending: Emergency Medicine | Admitting: Emergency Medicine

## 2015-12-08 ENCOUNTER — Encounter: Payer: Self-pay | Admitting: Emergency Medicine

## 2015-12-08 DIAGNOSIS — Z86718 Personal history of other venous thrombosis and embolism: Secondary | ICD-10-CM | POA: Diagnosis not present

## 2015-12-08 DIAGNOSIS — K625 Hemorrhage of anus and rectum: Secondary | ICD-10-CM | POA: Diagnosis present

## 2015-12-08 DIAGNOSIS — E785 Hyperlipidemia, unspecified: Secondary | ICD-10-CM | POA: Insufficient documentation

## 2015-12-08 DIAGNOSIS — Z79899 Other long term (current) drug therapy: Secondary | ICD-10-CM | POA: Insufficient documentation

## 2015-12-08 DIAGNOSIS — K922 Gastrointestinal hemorrhage, unspecified: Secondary | ICD-10-CM | POA: Insufficient documentation

## 2015-12-08 DIAGNOSIS — I4891 Unspecified atrial fibrillation: Secondary | ICD-10-CM | POA: Diagnosis not present

## 2015-12-08 DIAGNOSIS — I10 Essential (primary) hypertension: Secondary | ICD-10-CM | POA: Diagnosis not present

## 2015-12-08 LAB — TYPE AND SCREEN
ABO/RH(D): O POS
ANTIBODY SCREEN: NEGATIVE

## 2015-12-08 LAB — PROTIME-INR
INR: 1.2
Prothrombin Time: 15.4 seconds — ABNORMAL HIGH (ref 11.4–15.0)

## 2015-12-08 LAB — COMPREHENSIVE METABOLIC PANEL
ALK PHOS: 65 U/L (ref 38–126)
ALT: 15 U/L (ref 14–54)
ANION GAP: 7 (ref 5–15)
AST: 21 U/L (ref 15–41)
Albumin: 3.8 g/dL (ref 3.5–5.0)
BILIRUBIN TOTAL: 0.7 mg/dL (ref 0.3–1.2)
BUN: 17 mg/dL (ref 6–20)
CALCIUM: 9.3 mg/dL (ref 8.9–10.3)
CO2: 26 mmol/L (ref 22–32)
Chloride: 100 mmol/L — ABNORMAL LOW (ref 101–111)
Creatinine, Ser: 1.13 mg/dL — ABNORMAL HIGH (ref 0.44–1.00)
GFR, EST AFRICAN AMERICAN: 52 mL/min — AB (ref >60)
GFR, EST NON AFRICAN AMERICAN: 45 mL/min — AB (ref >60)
GLUCOSE: 134 mg/dL — AB (ref 65–99)
Potassium: 4.1 mmol/L (ref 3.5–5.1)
Sodium: 133 mmol/L — ABNORMAL LOW (ref 135–145)
TOTAL PROTEIN: 6.9 g/dL (ref 6.5–8.1)

## 2015-12-08 LAB — URINALYSIS COMPLETE WITH MICROSCOPIC (ARMC ONLY)
BILIRUBIN URINE: NEGATIVE
Glucose, UA: NEGATIVE mg/dL
Ketones, ur: NEGATIVE mg/dL
LEUKOCYTES UA: NEGATIVE
Nitrite: NEGATIVE
PH: 7 (ref 5.0–8.0)
PROTEIN: NEGATIVE mg/dL
Specific Gravity, Urine: 1.005 (ref 1.005–1.030)
WBC, UA: NONE SEEN WBC/hpf (ref 0–5)

## 2015-12-08 LAB — TROPONIN I: Troponin I: <0.03 ng/mL (ref <0.031)

## 2015-12-08 LAB — CBC
HEMATOCRIT: 41.1 % (ref 35.0–47.0)
HEMOGLOBIN: 14 g/dL (ref 12.0–16.0)
MCH: 30.4 pg (ref 26.0–34.0)
MCHC: 34.1 g/dL (ref 32.0–36.0)
MCV: 89.3 fL (ref 80.0–100.0)
Platelets: 175 10*3/uL (ref 150–440)
RBC: 4.61 MIL/uL (ref 3.80–5.20)
RDW: 13.4 % (ref 11.5–14.5)
WBC: 5.3 10*3/uL (ref 3.6–11.0)

## 2015-12-08 LAB — ABO/RH: ABO/RH(D): O POS

## 2015-12-08 LAB — APTT: aPTT: 40 seconds — ABNORMAL HIGH (ref 24–36)

## 2015-12-08 NOTE — Telephone Encounter (Signed)
No answer, will await report from [facility] walk in.

## 2015-12-08 NOTE — Telephone Encounter (Signed)
Pt daughter calling to let us know patient is going to [facility] walk in clinic  For she saw blood in her stool.  She thinks its been going on longer for a bit.  Pt has been on eliquis for a month now. Not sure if it is with that  Please advise.

## 2015-12-08 NOTE — ED Notes (Signed)
MD at bedside. 

## 2015-12-08 NOTE — Discharge Instructions (Signed)

## 2015-12-08 NOTE — ED Provider Notes (Signed)
[facility] Emergency Department Provider Note  ____________________________________________    I have reviewed the triage vital signs and the nursing notes.   HISTORY  Chief Complaint Rectal Bleeding    HPI Mary Rice is a 82 y.o. female who presents with complaints of rectal bleeding. Patient reports about a week ago she noticed her stools were turning dark/black. Today she noticed blood in the toilet bowl after a bowel movement. She was put on eliquis 2 months ago for atrial fibrillation. She denies abdominal pain. No chest pain no dizziness. No history of GI bleeding in the past .    Past Medical History  Diagnosis Date  . Hypertension   . Hyperlipemia   . Hiatal hernia   . Pulmonary embolism ([place])   . DVT (deep venous thrombosis) [facility])     Patient Active Problem List   Diagnosis Date Noted  . Atrial fibrillation ([place]) 10/10/2015  . SOB (shortness of breath) 10/10/2015  . Obesity 10/10/2015  . Hyperlipidemia 10/10/2015  . Essential hypertension 10/10/2015  . Food impaction of esophagus     Past Surgical History  Procedure Laterality Date  . Abdominal hysterectomy    . Cholecystectomy    . Cataract extraction    . Esophagogastroduodenoscopy N/A 08/19/2015    Procedure: ESOPHAGOGASTRODUODENOSCOPY (EGD);  Surgeon: Mauri Pole, MD;  Location: [facility] ENDOSCOPY;  Service: Endoscopy;  Laterality: N/A;    Current Outpatient Rx  Name  Route  Sig  Dispense  Refill  . apixaban (ELIQUIS) 5 MG TABS tablet   Oral   Take 1 tablet (5 mg total) by mouth 2 (two) times daily.   180 tablet   4   . atenolol (TENORMIN) 25 MG tablet   Oral   Take 25 mg by mouth daily.         . Calcium Carbonate (CALCIUM 600 PO)   Oral   Take by mouth daily.         . Cholecalciferol (VITAMIN D) 2000 units tablet   Oral   Take 2,000 Units by mouth daily.         . hydrochlorothiazide (HYDRODIURIL) 25 MG tablet   Oral   Take 25 mg by mouth daily.          . Multiple Vitamin (MULTIVITAMIN) tablet   Oral   Take 1 tablet by mouth daily.         Marland Kitchen omeprazole (PRILOSEC) 20 MG capsule   Oral   Take 1 capsule (20 mg total) by mouth daily.   90 capsule   4   . simvastatin (ZOCOR) 80 MG tablet   Oral   Take 40 mg by mouth daily.          Marland Kitchen telmisartan (MICARDIS) 80 MG tablet   Oral   Take 80 mg by mouth daily.           Allergies Sulfa antibiotics  Family History  Problem Relation Age of Onset  . Heart failure Mother     Social History Social History  Substance Use Topics  . Smoking status: Never Smoker   . Smokeless tobacco: None  . Alcohol Use: No    Review of Systems  Constitutional: Negative for fever.Negative for dizziness Eyes: Negative for redness ENT: Negative for sore throat Cardiovascular: Negative for chest pain Respiratory: Negative for shortness of breath. Gastrointestinal: Negative for abdominal pain, Rectal bleeding as above Genitourinary: Negative for dysuria. Musculoskeletal: Negative for back pain. Skin: Negative for pallor Neurological: Negative for  focal weakness Psychiatric: no anxiety    ____________________________________________   PHYSICAL EXAM:  VITAL SIGNS: ED Triage Vitals  Enc Vitals Group     BP 12/08/15 1005 143/55 mmHg     Pulse Rate 12/08/15 1005 71     Resp 12/08/15 1005 18     Temp 12/08/15 1005 97.8 F (36.6 C)     Temp Source 12/08/15 1005 Oral     SpO2 12/08/15 1005 99 %     Weight 12/08/15 1005 231 lb (104.781 kg)     Height 12/08/15 1005 5\' 7"  (1.702 m)     Head Cir --      Peak Flow --      Pain Score --      Pain Loc --      Pain Edu? --      Excl. in [place]? --      Constitutional: Alert and oriented. Well appearing and in no distress.  Eyes: Conjunctivae are normal. No erythema or injection ENT   Head: Normocephalic and atraumatic.   Mouth/Throat: Mucous membranes are moist. Cardiovascular: Normal rate, regular rhythm. Normal and  symmetric distal pulses are present in the upper extremities.  Respiratory: Normal respiratory effort without tachypnea nor retractions. Breath sounds are clear and equal bilaterally.  Gastrointestinal: Soft and non-tender in all quadrants. No distention. There is no CVA tenderness.Reddish stool, guaiac positive Genitourinary: deferred Musculoskeletal: Nontender with normal range of motion in all extremities. No lower extremity tenderness nor edema. Neurologic:  Normal speech and language. No gross focal neurologic deficits are appreciated. Skin:  Skin is warm, dry and intact. No rash noted. Psychiatric: Mood and affect are normal. Patient exhibits appropriate insight and judgment.  ____________________________________________    LABS (pertinent positives/negatives)  Labs Reviewed  CBC  COMPREHENSIVE METABOLIC PANEL  APTT  PROTIME-INR  TROPONIN I  URINALYSIS COMPLETEWITH MICROSCOPIC ([facility] ONLY)  TYPE AND SCREEN    ____________________________________________   EKG  ED ECG REPORT I, Lavonia Drafts, the attending physician, personally viewed and interpreted this ECG.   Date: 12/08/2015  EKG Time: 10:34 AM  Rate: 72  Rhythm: atrial fibrillation, rate 72  Axis: Normal  Intervals:none  ST&T Change: Nonspecific changes   ____________________________________________    RADIOLOGY  None  ____________________________________________   PROCEDURES  Procedure(s) performed: none  Critical Care performed: none  ____________________________________________   INITIAL IMPRESSION / ASSESSMENT AND PLAN / ED COURSE  Pertinent labs & imaging results that were available during my care of the patient were reviewed by me and considered in my medical decision making (see chart for details).  Patient presents with rectal bleeding on eliquis. We will send type and screen, coags, labs, and certainly IV and placed the patient cardiac monitor and reevaluate. Vitals are stable  currently  Discussion with the patient and her daughter about her relatively stable hemoglobin and whether or not to stay in the hospital. Daughter is confident that she can arrange for repeat hemoglobin tomorrow. Patient already has gastroenterologist with follow-up as well. Given this I feel it is reasonable for the patient to be discharged given one week of symptoms and only a minor decrease in hemoglobin with very stable vital signs. We discussed return precautions at some length. Patient and family agree with plan ____________________________________________   FINAL CLINICAL IMPRESSION(S) / ED DIAGNOSES  Final diagnoses:  Acute GI bleeding          Lavonia Drafts, MD 12/08/15 1523

## 2015-12-08 NOTE — Telephone Encounter (Signed)
Pt currently in [facility] ED. 

## 2015-12-08 NOTE — ED Notes (Signed)
Pt presents with dark stools x1 week and this am had some blood in her stool. Pt is currently taking eliquis for AFIB.

## 2015-12-08 NOTE — ED Notes (Signed)
Pt assisted to bedside commode

## 2015-12-08 NOTE — ED Notes (Signed)
Pt alert and oriented X4, active, cooperative, pt in NAD. RR even and unlabored, color WNL.  Pt informed to return if any life threatening symptoms occur.   

## 2016-01-18 ENCOUNTER — Other Ambulatory Visit: Payer: Self-pay | Admitting: Internal Medicine

## 2016-01-18 DIAGNOSIS — Z1231 Encounter for screening mammogram for malignant neoplasm of breast: Secondary | ICD-10-CM

## 2016-01-30 ENCOUNTER — Ambulatory Visit
Admission: RE | Admit: 2016-01-30 | Discharge: 2016-01-30 | Disposition: A | Discharge status: Home / Self Care | Payer: Medicare Other | Source: Ambulatory Visit | Attending: Internal Medicine | Admitting: Internal Medicine

## 2016-01-30 DIAGNOSIS — Z1231 Encounter for screening mammogram for malignant neoplasm of breast: Secondary | ICD-10-CM

## 2016-08-21 ENCOUNTER — Emergency Department
Admission: EM | Admit: 2016-08-21 | Discharge: 2016-08-21 | Disposition: A | Discharge status: Home / Self Care | Payer: Medicare Other | Attending: Emergency Medicine | Admitting: Emergency Medicine

## 2016-08-21 ENCOUNTER — Emergency Department: Payer: Medicare Other

## 2016-08-21 ENCOUNTER — Encounter: Payer: Self-pay | Admitting: Emergency Medicine

## 2016-08-21 DIAGNOSIS — Z79899 Other long term (current) drug therapy: Secondary | ICD-10-CM | POA: Diagnosis not present

## 2016-08-21 DIAGNOSIS — R0602 Shortness of breath: Secondary | ICD-10-CM | POA: Diagnosis present

## 2016-08-21 DIAGNOSIS — J101 Influenza due to other identified influenza virus with other respiratory manifestations: Secondary | ICD-10-CM

## 2016-08-21 DIAGNOSIS — E871 Hypo-osmolality and hyponatremia: Secondary | ICD-10-CM | POA: Diagnosis not present

## 2016-08-21 DIAGNOSIS — J09X2 Influenza due to identified novel influenza A virus with other respiratory manifestations: Secondary | ICD-10-CM | POA: Insufficient documentation

## 2016-08-21 DIAGNOSIS — I10 Essential (primary) hypertension: Secondary | ICD-10-CM | POA: Diagnosis not present

## 2016-08-21 LAB — BASIC METABOLIC PANEL
ANION GAP: 9 (ref 5–15)
ANION GAP: 9 (ref 5–15)
BUN: 14 mg/dL (ref 6–20)
BUN: 14 mg/dL (ref 6–20)
CALCIUM: 8.4 mg/dL — AB (ref 8.9–10.3)
CHLORIDE: 89 mmol/L — AB (ref 101–111)
CO2: 27 mmol/L (ref 22–32)
CO2: 26 mmol/L (ref 22–32)
CREATININE: 0.98 mg/dL (ref 0.44–1.00)
Calcium: 8.2 mg/dL — ABNORMAL LOW (ref 8.9–10.3)
Chloride: 90 mmol/L — ABNORMAL LOW (ref 101–111)
Creatinine, Ser: 1.09 mg/dL — ABNORMAL HIGH (ref 0.44–1.00)
GFR calc Af Amer: >60 mL/min (ref >60)
GFR calc non Af Amer: 46 mL/min — ABNORMAL LOW (ref >60)
GFR, EST AFRICAN AMERICAN: 54 mL/min — AB (ref >60)
GFR, EST NON AFRICAN AMERICAN: 53 mL/min — AB (ref >60)
GLUCOSE: 122 mg/dL — AB (ref 65–99)
Glucose, Bld: 97 mg/dL (ref 65–99)
Potassium: 3.7 mmol/L (ref 3.5–5.1)
Potassium: 3.2 mmol/L — ABNORMAL LOW (ref 3.5–5.1)
Sodium: 125 mmol/L — ABNORMAL LOW (ref 135–145)
Sodium: 125 mmol/L — ABNORMAL LOW (ref 135–145)

## 2016-08-21 LAB — CBC WITH DIFFERENTIAL/PLATELET
BASOS ABS: 0.1 10*3/uL (ref 0–0.1)
BASOS PCT: 3 %
Eosinophils Absolute: 0 10*3/uL (ref 0–0.7)
Eosinophils Relative: 1 %
HEMATOCRIT: 36 % (ref 35.0–47.0)
HEMOGLOBIN: 12.5 g/dL (ref 12.0–16.0)
Lymphocytes Relative: 10 %
Lymphs Abs: 0.3 10*3/uL — ABNORMAL LOW (ref 1.0–3.6)
MCH: 31.1 pg (ref 26.0–34.0)
MCHC: 34.7 g/dL (ref 32.0–36.0)
MCV: 89.7 fL (ref 80.0–100.0)
Monocytes Absolute: 0.7 10*3/uL (ref 0.2–0.9)
Monocytes Relative: 23 %
NEUTROS ABS: 2 10*3/uL (ref 1.4–6.5)
NEUTROS PCT: 63 %
Platelets: 140 10*3/uL — ABNORMAL LOW (ref 150–440)
RBC: 4.01 MIL/uL (ref 3.80–5.20)
RDW: 14.2 % (ref 11.5–14.5)
WBC: 3.1 10*3/uL — ABNORMAL LOW (ref 3.6–11.0)

## 2016-08-21 LAB — TROPONIN I: Troponin I: <0.03 ng/mL (ref <0.03)

## 2016-08-21 LAB — INFLUENZA PANEL BY PCR (TYPE A & B)
INFLAPCR: POSITIVE — AB
INFLBPCR: NEGATIVE

## 2016-08-21 MED ORDER — IPRATROPIUM-ALBUTEROL 0.5-2.5 (3) MG/3ML IN SOLN
3.0000 mL | Freq: Once | RESPIRATORY_TRACT | Status: AC
  Administered 2016-08-21: 3 mL via RESPIRATORY_TRACT
  Filled 2016-08-21: qty 3

## 2016-08-21 MED ORDER — OSELTAMIVIR PHOSPHATE 30 MG PO CAPS: 30.0000 mg | ORAL_CAPSULE | Freq: Two times a day (BID) | ORAL | 0 refills | Status: AC

## 2016-08-21 MED ORDER — ALBUTEROL SULFATE HFA 108 (90 BASE) MCG/ACT IN AERS: 2.0000 | INHALATION_SPRAY | Freq: Four times a day (QID) | RESPIRATORY_TRACT | 2 refills | Status: DC | PRN

## 2016-08-21 MED ORDER — SODIUM CHLORIDE 0.9 % IV BOLUS (SEPSIS)
1000.0000 mL | Freq: Once | INTRAVENOUS | Status: AC
  Administered 2016-08-21: 1000 mL via INTRAVENOUS

## 2016-08-21 MED ORDER — OSELTAMIVIR PHOSPHATE 30 MG PO CAPS
30.0000 mg | ORAL_CAPSULE | Freq: Once | ORAL | Status: AC
  Administered 2016-08-21: 30 mg via ORAL
  Filled 2016-08-21: qty 1

## 2016-08-21 NOTE — ED Provider Notes (Signed)
[facility] Emergency Department Provider Note  ____________________________________________  Time seen: Approximately 11:32 AM  I have reviewed the triage vital signs and the nursing notes.   HISTORY  Chief Complaint Shortness of Breath   HPI Mary Rice is a 83 y.o. female history of chronic atrial fibrillation on Eliquis, provoked PE/DVT, hypertension and hyperlipidemia who presents for evaluation of shortness of breath. Patient was taken to urgent care today for cough, generalized weakness, and wheezing. She was found to be satting 88% on room air and was sent to the emergency room for evaluation. According to her daughter patient has been sick for the last 2 days with a severe dry cough, shortness of breath, chills, generalized weakness. This morning started having diarrhea and has had 2 episodes of watery diarrhea. No vomiting, no abdominal pain, no chest pain. Patient's vaccines are up-to-date including flu.  Past Medical History:  Diagnosis Date  . DVT (deep venous thrombosis) ([place])   . Hiatal hernia   . Hyperlipemia   . Hypertension   . Pulmonary embolism [facility])     Patient Active Problem List   Diagnosis Date Noted  . Atrial fibrillation ([place]) 10/10/2015  . SOB (shortness of breath) 10/10/2015  . Obesity 10/10/2015  . Hyperlipidemia 10/10/2015  . Essential hypertension 10/10/2015  . Food impaction of esophagus     Past Surgical History:  Procedure Laterality Date  . ABDOMINAL HYSTERECTOMY    . CATARACT EXTRACTION    . CHOLECYSTECTOMY    . ESOPHAGOGASTRODUODENOSCOPY N/A 08/19/2015   Procedure: ESOPHAGOGASTRODUODENOSCOPY (EGD);  Surgeon: Mauri Pole, MD;  Location: [facility] ENDOSCOPY;  Service: Endoscopy;  Laterality: N/A;    Prior to Admission medications   Medication Sig Start Date End Date Taking? Authorizing Provider  apixaban (ELIQUIS) 5 MG TABS tablet Take 5 mg by mouth 2 (two) times daily.   Yes Historical Provider, MD    atenolol (TENORMIN) 25 MG tablet Take 25 mg by mouth daily. 08/09/15  Yes Historical Provider, MD  Calcium Carbonate (CALCIUM 600 PO) Take by mouth daily.   Yes Historical Provider, MD  Cholecalciferol (VITAMIN D) 2000 units tablet Take 2,000 Units by mouth daily.   Yes Historical Provider, MD  hydrochlorothiazide (HYDRODIURIL) 25 MG tablet Take 25 mg by mouth daily as needed.  09/16/15  Yes Historical Provider, MD  Multiple Vitamin (MULTIVITAMIN) tablet Take 1 tablet by mouth daily.   Yes Historical Provider, MD  pantoprazole (PROTONIX) 40 MG tablet Take 40 mg by mouth daily.   Yes Historical Provider, MD  telmisartan (MICARDIS) 80 MG tablet Take 80 mg by mouth daily.   Yes Historical Provider, MD  omeprazole (PRILOSEC) 20 MG capsule Take 1 capsule (20 mg total) by mouth daily. Patient not taking: Reported on 08/21/2016 10/10/15   Minna Merritts, MD  oseltamivir (TAMIFLU) 30 MG capsule Take 1 capsule (30 mg total) by mouth 2 (two) times daily. 08/21/16 08/26/16  Rudene Re, MD    Allergies Sulfa antibiotics  Family History  Problem Relation Age of Onset  . Heart failure Mother     Social History Social History  Substance Use Topics  . Smoking status: Never Smoker  . Smokeless tobacco: Never Used  . Alcohol use No    Review of Systems  Constitutional: Negative for fever. + chills and generalized weakness Eyes: Negative for visual changes. ENT: Negative for sore throat. Neck: No neck pain  Cardiovascular: Negative for chest pain. Respiratory: + shortness of breath and cough Gastrointestinal: Negative for  abdominal pain, vomiting. + diarrhea. Genitourinary: Negative for dysuria. Musculoskeletal: Negative for back pain. Skin: Negative for rash. Neurological: Negative for headaches, weakness or numbness. Psych: No SI or HI  ____________________________________________   PHYSICAL EXAM:  VITAL SIGNS: ED Triage Vitals  Enc Vitals Group     BP 08/21/16 1106 (!) 124/56      Pulse Rate 08/21/16 1106 (!) 59     Resp 08/21/16 1106 12     Temp 08/21/16 1106 97 F (36.1 C)     Temp Source 08/21/16 1106 Oral     SpO2 08/21/16 1106 98 %     Weight 08/21/16 1101 231 lb (104.8 kg)     Height --      Head Circumference --      Peak Flow --      Pain Score 08/21/16 1101 8     Pain Loc --      Pain Edu? --      Excl. in [place]? --    Constitutional: Alert and oriented. Well appearing and in no apparent distress. HEENT:      Head: Normocephalic and atraumatic.         Eyes: Conjunctivae are normal. Sclera is non-icteric. EOMI. PERRL      Mouth/Throat: Mucous membranes are moist.       Neck: Supple with no signs of meningismus. Cardiovascular: Regular rate and rhythm. No murmurs, gallops, or rubs. 2+ symmetrical distal pulses are present in all extremities. No JVD. Respiratory: Normal respiratory effort. Diffuse expiratory wheezes, no crackles Gastrointestinal: Soft, non tender, and non distended with positive bowel sounds. No rebound or guarding. Genitourinary: No CVA tenderness. Musculoskeletal: Nontender with normal range of motion in all extremities. No edema, cyanosis, or erythema of extremities. Neurologic: Normal speech and language. Face is symmetric. Moving all extremities. No gross focal neurologic deficits are appreciated. Skin: Skin is warm, dry and intact. No rash noted. Psychiatric: Mood and affect are normal. Speech and behavior are normal.  ____________________________________________   LABS (all labs ordered are listed, but only abnormal results are displayed)  Labs Reviewed  CBC WITH DIFFERENTIAL/PLATELET - Abnormal; Notable for the following:       Result Value   WBC 3.1 (*)    Platelets 140 (*)    Lymphs Abs 0.3 (*)    All other components within normal limits  BASIC METABOLIC PANEL - Abnormal; Notable for the following:    Sodium 125 (*)    Chloride 89 (*)    Creatinine, Ser 1.09 (*)    Calcium 8.2 (*)    GFR calc non Af Amer 46  (*)    GFR calc Af Amer 54 (*)    All other components within normal limits  INFLUENZA PANEL BY PCR (TYPE A & B) - Abnormal; Notable for the following:    Influenza A By PCR POSITIVE (*)    All other components within normal limits  BASIC METABOLIC PANEL - Abnormal; Notable for the following:    Sodium 125 (*)    Potassium 3.2 (*)    Chloride 90 (*)    Glucose, Bld 122 (*)    Calcium 8.4 (*)    GFR calc non Af Amer 53 (*)    All other components within normal limits  TROPONIN I   ____________________________________________  EKG  ED ECG REPORT I, Rudene Re, the attending physician, personally viewed and interpreted this ECG.  Atrial fibrillation, rate of 56, normal QRS and QTc intervals, normal axis, no ST elevations  or depressions. EKG is unchanged from prior. ____________________________________________  RADIOLOGY  CXR: Negative ____________________________________________   PROCEDURES  Procedure(s) performed: None Procedures Critical Care performed:  None ____________________________________________   INITIAL IMPRESSION / ASSESSMENT AND PLAN / ED COURSE  83 y.o. female history of chronic atrial fibrillation on Eliquis, provoked PE/DVT, hypertension and hyperlipidemia who presents for evaluation of dry cough, wheezing, shortness of breath, diarrhea, and generalized weakness x 2 days. Patient found hypoxic 88% at [facility]. Here patient is well-appearing, normal work of breathing, she has diffuse expiratory wheezes throughout. We'll check for flu, pneumonia, basic blood work. We'll give DuoNeb treatment.  Clinical Course as of Aug 22 1603  Tue Aug 21, 2016  1447 Patient found to be influenza A positive. Mild hyponatremia she was given IV fluids, she received DuoNeb treatments with improvement of her respiratory status. Patient ambulated and maintained her sats are 96%. She is going to be discharged home with by mouth hydration, Tamiflu, close follow-up with primary  care doctor.  [CV]  1603 Repeat BMP after a liter of fluid show improvement of her creatinine however hyponatremic persistent 125. Recommended admission to the hospital for IV hydration however patient wishes to go home. She reports that she will follow-up tomorrow morning with her primary care doctor for repeat sodium levels. Recommended that she return to the emergency room she gets confused or for shortness of breath returns.  [CV]    Clinical Course User Index [CV] Rudene Re, MD    Pertinent labs & imaging results that were available during my care of the patient were reviewed by me and considered in my medical decision making (see chart for details).    ____________________________________________   FINAL CLINICAL IMPRESSION(S) / ED DIAGNOSES  Final diagnoses:  Influenza A  Hyponatremia      NEW MEDICATIONS STARTED DURING THIS VISIT:  New Prescriptions   OSELTAMIVIR (TAMIFLU) 30 MG CAPSULE    Take 1 capsule (30 mg total) by mouth 2 (two) times daily.     Note:  This document was prepared using Dragon voice recognition software and may include unintentional dictation errors.    Rudene Re, MD 08/21/16 1606

## 2016-08-21 NOTE — ED Triage Notes (Signed)
Sent to ED from [facility] for evaluation.  Patient C/O SOB and cough x 4 days.  Henry reports that patient's o2 saturation was 88% at home this morning.  Vital signs at [facility] were stable.  O2 sat on RA 99.    Patient is AAOx3.  Skin warm and dry.  Strong dry cough. No SOB/ DOE

## 2016-08-21 NOTE — ED Notes (Addendum)
O2 sat 96% walking

## 2016-12-28 ENCOUNTER — Other Ambulatory Visit: Payer: Self-pay | Admitting: Internal Medicine

## 2016-12-28 DIAGNOSIS — Z1231 Encounter for screening mammogram for malignant neoplasm of breast: Secondary | ICD-10-CM

## 2017-01-30 ENCOUNTER — Ambulatory Visit
Admission: RE | Admit: 2017-01-30 | Discharge: 2017-01-30 | Disposition: A | Discharge status: Home / Self Care | Payer: Medicare Other | Source: Ambulatory Visit | Attending: Internal Medicine | Admitting: Internal Medicine

## 2017-01-30 DIAGNOSIS — Z1231 Encounter for screening mammogram for malignant neoplasm of breast: Secondary | ICD-10-CM

## 2017-06-08 DEATH — deceased

## 2017-10-08 ENCOUNTER — Encounter (INDEPENDENT_AMBULATORY_CARE_PROVIDER_SITE_OTHER): Payer: Self-pay | Admitting: Vascular Surgery

## 2017-10-17 ENCOUNTER — Encounter (INDEPENDENT_AMBULATORY_CARE_PROVIDER_SITE_OTHER): Payer: Self-pay | Admitting: Vascular Surgery

## 2018-01-06 ENCOUNTER — Other Ambulatory Visit: Payer: Self-pay | Admitting: Internal Medicine

## 2018-01-06 DIAGNOSIS — Z1231 Encounter for screening mammogram for malignant neoplasm of breast: Secondary | ICD-10-CM

## 2018-02-03 ENCOUNTER — Ambulatory Visit
Admission: RE | Admit: 2018-02-03 | Discharge: 2018-02-03 | Disposition: A | Discharge status: Home / Self Care | Payer: Medicare Other | Source: Ambulatory Visit | Attending: Internal Medicine | Admitting: Internal Medicine

## 2018-02-03 DIAGNOSIS — Z1231 Encounter for screening mammogram for malignant neoplasm of breast: Secondary | ICD-10-CM

## 2019-04-01 ENCOUNTER — Other Ambulatory Visit: Payer: Self-pay

## 2019-04-01 ENCOUNTER — Encounter: Payer: Self-pay | Admitting: Emergency Medicine

## 2019-04-01 ENCOUNTER — Emergency Department
Admission: EM | Admit: 2019-04-01 | Discharge: 2019-04-01 | Disposition: A | Discharge status: Home / Self Care | Payer: Medicare Other | Attending: Emergency Medicine | Admitting: Emergency Medicine

## 2019-04-01 ENCOUNTER — Emergency Department: Payer: Medicare Other

## 2019-04-01 DIAGNOSIS — R55 Syncope and collapse: Secondary | ICD-10-CM | POA: Insufficient documentation

## 2019-04-01 DIAGNOSIS — Z7901 Long term (current) use of anticoagulants: Secondary | ICD-10-CM | POA: Diagnosis not present

## 2019-04-01 DIAGNOSIS — I10 Essential (primary) hypertension: Secondary | ICD-10-CM | POA: Diagnosis not present

## 2019-04-01 DIAGNOSIS — Z79899 Other long term (current) drug therapy: Secondary | ICD-10-CM | POA: Diagnosis not present

## 2019-04-01 DIAGNOSIS — R0602 Shortness of breath: Secondary | ICD-10-CM | POA: Insufficient documentation

## 2019-04-01 DIAGNOSIS — U071 COVID-19: Secondary | ICD-10-CM | POA: Diagnosis not present

## 2019-04-01 LAB — BASIC METABOLIC PANEL
Anion gap: 12 (ref 5–15)
BUN: 18 mg/dL (ref 8–23)
CO2: 27 mmol/L (ref 22–32)
Calcium: 9.1 mg/dL (ref 8.9–10.3)
Chloride: 92 mmol/L — ABNORMAL LOW (ref 98–111)
Creatinine, Ser: 1.02 mg/dL — ABNORMAL HIGH (ref 0.44–1.00)
GFR calc Af Amer: 58 mL/min — ABNORMAL LOW (ref >60)
GFR calc non Af Amer: 50 mL/min — ABNORMAL LOW (ref >60)
Glucose, Bld: 115 mg/dL — ABNORMAL HIGH (ref 70–99)
Potassium: 3.8 mmol/L (ref 3.5–5.1)
Sodium: 131 mmol/L — ABNORMAL LOW (ref 135–145)

## 2019-04-01 LAB — CBC
HCT: 42.6 % (ref 36.0–46.0)
Hemoglobin: 14.2 g/dL (ref 12.0–15.0)
MCH: 28.9 pg (ref 26.0–34.0)
MCHC: 33.3 g/dL (ref 30.0–36.0)
MCV: 86.6 fL (ref 80.0–100.0)
Platelets: 229 10*3/uL (ref 150–400)
RBC: 4.92 MIL/uL (ref 3.87–5.11)
RDW: 14.5 % (ref 11.5–15.5)
WBC: 9.1 10*3/uL (ref 4.0–10.5)
nRBC: 0 % (ref 0.0–0.2)

## 2019-04-01 LAB — URINALYSIS, COMPLETE (UACMP) WITH MICROSCOPIC
Bacteria, UA: NONE SEEN
Bilirubin Urine: NEGATIVE
Glucose, UA: NEGATIVE mg/dL
Hgb urine dipstick: NEGATIVE
Ketones, ur: NEGATIVE mg/dL
Nitrite: NEGATIVE
Protein, ur: NEGATIVE mg/dL
Specific Gravity, Urine: >1.046 — ABNORMAL HIGH (ref 1.005–1.030)
pH: 7 (ref 5.0–8.0)

## 2019-04-01 LAB — SARS CORONAVIRUS 2 BY RT PCR (HOSPITAL ORDER, PERFORMED IN ~~LOC~~ HOSPITAL LAB): SARS Coronavirus 2: POSITIVE — AB

## 2019-04-01 LAB — TROPONIN I (HIGH SENSITIVITY)
Troponin I (High Sensitivity): 43 ng/L — ABNORMAL HIGH (ref <18)
Troponin I (High Sensitivity): 12 ng/L (ref <18)

## 2019-04-01 MED ORDER — SODIUM CHLORIDE 0.9 % IV BOLUS
1000.0000 mL | Freq: Once | INTRAVENOUS | Status: AC
  Administered 2019-04-01: 13:00:00 1000 mL via INTRAVENOUS

## 2019-04-01 MED ORDER — IOHEXOL 350 MG/ML SOLN
60.0000 mL | Freq: Once | INTRAVENOUS | Status: AC | PRN
  Administered 2019-04-01: 60 mL via INTRAVENOUS

## 2019-04-01 NOTE — ED Provider Notes (Signed)
3:49 PM Assumed care for off going team.   Blood pressure (!) 118/54, pulse 63, temperature 98.2 F (36.8 C), temperature source Oral, resp. rate 18, height 5\' 8"  (1.727 m), weight 99.8 kg, SpO2 97 %.  See their HPI for full report but in brief Plan to admit, Covid + in [facility] everywhere, syncope episode. Daughter started CPR. Got pulses when EMS got there. Trop 40s. CTA to rule out PE.    Pt positive on 9/16. We will need to retest today.  CT PE negative.   Patient does not want to stay.  I explained the risk and benefits.  I also discussed with patient's daughter.  D/w Levada Dy patient's daughter.  Levada Dy understands that if patient decides to leave it is against medical advice and that she could have another episode that leads to death or permanent disability.  They want to see how she does walk around.  Patient able to walk around.  Patient does desat to 89% but is not having any increased work of breathing.  I discussed these findings with Levada Dy and she still thinks that if her mom would like to leave that even though there is a risk of death and permanent disability that they would prefer to have patient discharge.  They understand that they can always return to the emergency room if she has another episode.  They have the capacity to make this decision, they are leaving [place], they understand the risk of leaving, they understand they can return to the ER if they decide to.               Vanessa Brielle, MD 04/01/19 [phone]

## 2019-04-01 NOTE — ED Notes (Signed)
Pt assisted to use bedpan at this time. Pt states that she wishes to be discharged. MD made aware of pt request.

## 2019-04-01 NOTE — ED Notes (Signed)
Pt reports being covid positive in the last few weeks, but does not know exact time. Test results are not noted in the chart.

## 2019-04-01 NOTE — Discharge Instructions (Signed)
We recommended admission for the syncope episode, elevated cardiac marker, low oxygen levels with walking.  We had a lengthy discussion with between you and Levada Dy and you will have elected to leave [place].  You return to the ER anytime if symptoms are worsening or you develop another episode.  You are still coronavirus positive so continue to quarantine at home.

## 2019-04-01 NOTE — ED Notes (Signed)
Pt daughter Marshell Garfinkel updated at this time. Contact number [phone]

## 2019-04-01 NOTE — ED Provider Notes (Signed)
[facility] Emergency Department Provider Note   ____________________________________________   I have reviewed the triage vital signs and the nursing notes.   HISTORY  Chief Complaint Loss of Consciousness   History limited by: Not Limited   HPI Mary Rice is a 85 y.o. female who presents to the emergency department today after a syncopal episode.  The patient was recently diagnosed with COVID.  She states that she felt weak today as she has over the past few days.  She denies any chest pain or palpitations before the syncopal episode.  Denies any significant shortness of breath.  She states her appetite has been decreased recently.  No vomiting or diarrhea.  Patient denies previous syncopal episodes in the past.   Records reviewed. Per medical record review patient has a history of atrial fibrillation, DVT, PE, HLD.   Past Medical History:  Diagnosis Date  . DVT (deep venous thrombosis) ([place])   . Hiatal hernia   . Hyperlipemia   . Hypertension   . Pulmonary embolism [facility])     Patient Active Problem List   Diagnosis Date Noted  . Atrial fibrillation ([place]) 10/10/2015  . SOB (shortness of breath) 10/10/2015  . Obesity 10/10/2015  . Hyperlipidemia 10/10/2015  . Essential hypertension 10/10/2015  . Food impaction of esophagus     Past Surgical History:  Procedure Laterality Date  . ABDOMINAL HYSTERECTOMY    . CATARACT EXTRACTION    . CHOLECYSTECTOMY    . ESOPHAGOGASTRODUODENOSCOPY N/A 08/19/2015   Procedure: ESOPHAGOGASTRODUODENOSCOPY (EGD);  Surgeon: Mauri Pole, MD;  Location: [facility] ENDOSCOPY;  Service: Endoscopy;  Laterality: N/A;    Prior to Admission medications   Medication Sig Start Date End Date Taking? Authorizing Provider  apixaban (ELIQUIS) 5 MG TABS tablet Take 5 mg by mouth 2 (two) times daily.   Yes [provider]  atenolol (TENORMIN) 25 MG tablet Take 25 mg by mouth daily. 08/09/15  Yes [provider]   Calcium Carbonate-Vit D-Min (CALCIUM 600+D3 PLUS MINERALS) 600-800 MG-UNIT TABS Take 1 tablet by mouth daily.   Yes [provider]  Cholecalciferol (VITAMIN D) 2000 units tablet Take 2,000 Units by mouth daily.   Yes [provider]  hydrochlorothiazide (HYDRODIURIL) 25 MG tablet Take 25 mg by mouth daily.    Yes [provider]  pantoprazole (PROTONIX) 40 MG tablet Take 40 mg by mouth daily.   Yes [provider]  pravastatin (PRAVACHOL) 20 MG tablet Take 20 mg by mouth daily.   Yes [provider]  predniSONE (DELTASONE) 20 MG tablet Take 60 mg by mouth daily. 03/27/19  Yes [provider]  telmisartan (MICARDIS) 80 MG tablet Take 80 mg by mouth daily.   Yes [provider]  vitamin C (ASCORBIC ACID) 500 MG tablet Take 500 mg by mouth daily.   Yes [provider]    Allergies Sulfa antibiotics  Family History  Problem Relation Age of Onset  . Heart failure Mother     Social History Social History   Tobacco Use  . Smoking status: Never Smoker  . Smokeless tobacco: Never Used  Substance Use Topics  . Alcohol use: No  . Drug use: No    Review of Systems Constitutional: No fever/chills. Positive for generalized weakness. Eyes: No visual changes. ENT: No sore throat. Cardiovascular: Denies chest pain. Respiratory: Denies shortness of breath. Gastrointestinal: No abdominal pain.  No nausea, no vomiting.  No diarrhea.   Genitourinary: Negative for dysuria. Musculoskeletal: Negative for  back pain. Skin: Negative for rash. Neurological: Negative for headaches, focal weakness or numbness.  ____________________________________________   PHYSICAL EXAM:  VITAL SIGNS: ED Triage Vitals  Enc Vitals Group     BP 04/01/19 1243 (!) 131/57     Pulse Rate 04/01/19 1243 68     Resp 04/01/19 1243 15     Temp 04/01/19 1243 98.2 F (36.8 C)     Temp Source 04/01/19 1243 Oral     SpO2 04/01/19 1232 99 %      Weight 04/01/19 1244 220 lb (99.8 kg)     Height 04/01/19 1244 5\' 8"  (1.727 m)     Head Circumference --      Peak Flow --      Pain Score 04/01/19 1244 3   Constitutional: Alert and oriented.  Eyes: Conjunctivae are normal.  ENT      Head: Normocephalic and atraumatic.      Nose: No congestion/rhinnorhea.      Mouth/Throat: Mucous membranes are moist.      Neck: No stridor. Hematological/Lymphatic/Immunilogical: No cervical lymphadenopathy. Cardiovascular: Normal rate, irregularly irregular rhythm.  No murmurs, rubs, or gallops.  Respiratory: Normal respiratory effort without tachypnea nor retractions. Breath sounds are clear and equal bilaterally. No wheezes/rales/rhonchi. Gastrointestinal: Soft and non tender. No rebound. No guarding.  Genitourinary: Deferred Musculoskeletal: Normal range of motion in all extremities. No lower extremity edema. Neurologic:  Normal speech and language. No gross focal neurologic deficits are appreciated.  Skin:  Skin is warm, dry and intact. No rash noted. Psychiatric: Mood and affect are normal. Speech and behavior are normal. Patient exhibits appropriate insight and judgment.  ____________________________________________    LABS (pertinent positives/negatives)  CBC wbc 9.1, hgb 14.2, plt 229 Trop hs 43 BMP na 131, k 3.8, cl 92, glu 115, cr 1.02 ____________________________________________   EKG  I, Nance Pear, attending physician, personally viewed and interpreted this EKG  EKG Time: 1242 Rate: 70 Rhythm: atrial fibrillation Axis: normal Intervals: qtc 427 QRS: narrow, q waves v1 ST changes: no st elevation Impression: abnormal ekg  ____________________________________________    RADIOLOGY  CT angio pending ____________________________________________   PROCEDURES  Procedures  ____________________________________________   INITIAL IMPRESSION / ASSESSMENT AND PLAN / ED COURSE  Pertinent labs & imaging results  that were available during my care of the patient were reviewed by me and considered in my medical decision making (see chart for details).   Patient presented to the emergency department today after what sounds like a syncopal episode.  Patient was administered CPR by family member on scene however when EMS arrived patient with good pulses.  On exam here patient does have A. fib however patient has history of A. fib.  Most recently COVID positive.  Troponin is somewhat elevated at 42.  Unclear if this is related to the event that occurred or perhaps a CPR.  However given the patient has history of blood clots will obtain a CT angios PE.  Discussed with patient that we will get the CT scan.  Regardless of findings I do think patient would benefit from admission given elevated troponin and syncopal episode.  Discussed with patient likely transfer to [place] facility.  ____________________________________________   FINAL CLINICAL IMPRESSION(S) / ED DIAGNOSES  Final diagnoses:  Syncope and collapse     Note: This dictation was prepared with Dragon dictation. Any transcriptional errors that result from this process are unintentional     Nance Pear, MD 04/02/19 [phone]

## 2019-07-08 ENCOUNTER — Ambulatory Visit
Admission: RE | Admit: 2019-07-08 | Discharge: 2019-07-08 | Disposition: A | Discharge status: Home / Self Care | Payer: Medicare Other | Source: Ambulatory Visit | Attending: Internal Medicine | Admitting: Internal Medicine

## 2019-07-08 ENCOUNTER — Other Ambulatory Visit: Payer: Self-pay

## 2019-07-08 ENCOUNTER — Other Ambulatory Visit: Payer: Self-pay | Admitting: Internal Medicine

## 2019-07-08 ENCOUNTER — Other Ambulatory Visit (HOSPITAL_COMMUNITY): Payer: Self-pay | Admitting: Internal Medicine

## 2019-07-08 DIAGNOSIS — S22000A Wedge compression fracture of unspecified thoracic vertebra, initial encounter for closed fracture: Secondary | ICD-10-CM | POA: Diagnosis present

## 2020-02-12 ENCOUNTER — Other Ambulatory Visit: Payer: Self-pay | Admitting: Internal Medicine

## 2020-02-12 DIAGNOSIS — M5416 Radiculopathy, lumbar region: Secondary | ICD-10-CM

## 2020-02-17 ENCOUNTER — Other Ambulatory Visit: Payer: Self-pay

## 2020-02-17 ENCOUNTER — Ambulatory Visit
Admission: RE | Admit: 2020-02-17 | Discharge: 2020-02-17 | Disposition: A | Discharge status: Home / Self Care | Payer: Medicare Other | Source: Ambulatory Visit | Attending: Internal Medicine | Admitting: Internal Medicine

## 2020-02-17 DIAGNOSIS — M5416 Radiculopathy, lumbar region: Secondary | ICD-10-CM | POA: Insufficient documentation

## 2020-02-24 ENCOUNTER — Other Ambulatory Visit: Payer: Self-pay | Admitting: Orthopedic Surgery

## 2020-02-24 ENCOUNTER — Other Ambulatory Visit: Payer: Self-pay

## 2020-02-24 ENCOUNTER — Other Ambulatory Visit
Admission: RE | Admit: 2020-02-24 | Discharge: 2020-02-24 | Disposition: A | Discharge status: Home / Self Care | Payer: Medicare Other | Source: Ambulatory Visit | Attending: Orthopedic Surgery | Admitting: Orthopedic Surgery

## 2020-02-24 DIAGNOSIS — Z20822 Contact with and (suspected) exposure to covid-19: Secondary | ICD-10-CM | POA: Insufficient documentation

## 2020-02-24 DIAGNOSIS — Z01812 Encounter for preprocedural laboratory examination: Secondary | ICD-10-CM | POA: Diagnosis present

## 2020-02-25 LAB — SARS CORONAVIRUS 2 (TAT 6-24 HRS): SARS Coronavirus 2: NEGATIVE

## 2020-02-26 ENCOUNTER — Encounter: Payer: Self-pay | Admitting: Orthopedic Surgery

## 2020-02-26 ENCOUNTER — Ambulatory Visit: Payer: Medicare Other | Admitting: Anesthesiology

## 2020-02-26 ENCOUNTER — Encounter
Admission: RE | Disposition: A | Discharge status: Home / Self Care | Payer: Self-pay | Source: Home / Self Care | Attending: Orthopedic Surgery

## 2020-02-26 ENCOUNTER — Ambulatory Visit
Admission: RE | Admit: 2020-02-26 | Discharge: 2020-02-26 | Disposition: A | Discharge status: Home / Self Care | Payer: Medicare Other | Attending: Orthopedic Surgery | Admitting: Orthopedic Surgery

## 2020-02-26 ENCOUNTER — Other Ambulatory Visit: Payer: Self-pay

## 2020-02-26 ENCOUNTER — Ambulatory Visit: Payer: Medicare Other

## 2020-02-26 DIAGNOSIS — W19XXXA Unspecified fall, initial encounter: Secondary | ICD-10-CM | POA: Diagnosis not present

## 2020-02-26 DIAGNOSIS — Z882 Allergy status to sulfonamides status: Secondary | ICD-10-CM | POA: Diagnosis not present

## 2020-02-26 DIAGNOSIS — Z7982 Long term (current) use of aspirin: Secondary | ICD-10-CM | POA: Insufficient documentation

## 2020-02-26 DIAGNOSIS — Z8616 Personal history of COVID-19: Secondary | ICD-10-CM | POA: Diagnosis not present

## 2020-02-26 DIAGNOSIS — Z79899 Other long term (current) drug therapy: Secondary | ICD-10-CM | POA: Insufficient documentation

## 2020-02-26 DIAGNOSIS — Z86718 Personal history of other venous thrombosis and embolism: Secondary | ICD-10-CM | POA: Insufficient documentation

## 2020-02-26 DIAGNOSIS — T148XXA Other injury of unspecified body region, initial encounter: Secondary | ICD-10-CM

## 2020-02-26 DIAGNOSIS — Z7902 Long term (current) use of antithrombotics/antiplatelets: Secondary | ICD-10-CM | POA: Insufficient documentation

## 2020-02-26 DIAGNOSIS — S22080A Wedge compression fracture of T11-T12 vertebra, initial encounter for closed fracture: Secondary | ICD-10-CM | POA: Diagnosis present

## 2020-02-26 DIAGNOSIS — Z9181 History of falling: Secondary | ICD-10-CM | POA: Insufficient documentation

## 2020-02-26 DIAGNOSIS — M419 Scoliosis, unspecified: Secondary | ICD-10-CM | POA: Diagnosis not present

## 2020-02-26 DIAGNOSIS — K219 Gastro-esophageal reflux disease without esophagitis: Secondary | ICD-10-CM | POA: Insufficient documentation

## 2020-02-26 DIAGNOSIS — Z880 Allergy status to penicillin: Secondary | ICD-10-CM | POA: Insufficient documentation

## 2020-02-26 DIAGNOSIS — K449 Diaphragmatic hernia without obstruction or gangrene: Secondary | ICD-10-CM | POA: Insufficient documentation

## 2020-02-26 DIAGNOSIS — E785 Hyperlipidemia, unspecified: Secondary | ICD-10-CM | POA: Diagnosis not present

## 2020-02-26 DIAGNOSIS — Z888 Allergy status to other drugs, medicaments and biological substances status: Secondary | ICD-10-CM | POA: Insufficient documentation

## 2020-02-26 DIAGNOSIS — I4891 Unspecified atrial fibrillation: Secondary | ICD-10-CM | POA: Insufficient documentation

## 2020-02-26 DIAGNOSIS — Z881 Allergy status to other antibiotic agents status: Secondary | ICD-10-CM | POA: Insufficient documentation

## 2020-02-26 DIAGNOSIS — I10 Essential (primary) hypertension: Secondary | ICD-10-CM | POA: Insufficient documentation

## 2020-02-26 HISTORY — PX: KYPHOPLASTY: SHX5884

## 2020-02-26 LAB — COMPREHENSIVE METABOLIC PANEL
ALT: 12 U/L (ref 0–44)
AST: 12 U/L — ABNORMAL LOW (ref 15–41)
Albumin: 3.7 g/dL (ref 3.5–5.0)
Alkaline Phosphatase: 91 U/L (ref 38–126)
Anion gap: 8 (ref 5–15)
BUN: 17 mg/dL (ref 8–23)
CO2: 27 mmol/L (ref 22–32)
Calcium: 9.1 mg/dL (ref 8.9–10.3)
Chloride: 99 mmol/L (ref 98–111)
Creatinine, Ser: 0.9 mg/dL (ref 0.44–1.00)
GFR calc Af Amer: >60 mL/min (ref >60)
GFR calc non Af Amer: 58 mL/min — ABNORMAL LOW (ref >60)
Glucose, Bld: 117 mg/dL — ABNORMAL HIGH (ref 70–99)
Potassium: 4.3 mmol/L (ref 3.5–5.1)
Sodium: 134 mmol/L — ABNORMAL LOW (ref 135–145)
Total Bilirubin: 1.2 mg/dL (ref 0.3–1.2)
Total Protein: 6.3 g/dL — ABNORMAL LOW (ref 6.5–8.1)

## 2020-02-26 LAB — GLUCOSE, CAPILLARY: Glucose-Capillary: 105 mg/dL — ABNORMAL HIGH (ref 70–99)

## 2020-02-26 SURGERY — KYPHOPLASTY
Anesthesia: General

## 2020-02-26 MED ORDER — ONDANSETRON HCL 4 MG/2ML IJ SOLN: 4.0000 mg | Freq: Once | INTRAMUSCULAR | Status: DC | PRN

## 2020-02-26 MED ORDER — ONDANSETRON HCL 4 MG PO TABS: 4.0000 mg | ORAL_TABLET | Freq: Four times a day (QID) | ORAL | Status: DC | PRN

## 2020-02-26 MED ORDER — CHLORHEXIDINE GLUCONATE 0.12 % MT SOLN
OROMUCOSAL | Status: AC
  Administered 2020-02-26: 15 mL via OROMUCOSAL
  Filled 2020-02-26: qty 15

## 2020-02-26 MED ORDER — METOCLOPRAMIDE HCL 10 MG PO TABS: 5.0000 mg | ORAL_TABLET | Freq: Three times a day (TID) | ORAL | Status: DC | PRN

## 2020-02-26 MED ORDER — ACETAMINOPHEN 10 MG/ML IV SOLN: 1000.0000 mg | Freq: Once | INTRAVENOUS | Status: DC | PRN

## 2020-02-26 MED ORDER — OXYCODONE HCL 5 MG PO TABS: 5.0000 mg | ORAL_TABLET | Freq: Once | ORAL | Status: DC | PRN

## 2020-02-26 MED ORDER — CEFAZOLIN SODIUM-DEXTROSE 2-4 GM/100ML-% IV SOLN
2.0000 g | INTRAVENOUS | Status: AC
  Administered 2020-02-26: 2 g via INTRAVENOUS

## 2020-02-26 MED ORDER — FENTANYL CITRATE (PF) 100 MCG/2ML IJ SOLN
INTRAMUSCULAR | Status: AC
  Filled 2020-02-26: qty 2

## 2020-02-26 MED ORDER — LACTATED RINGERS IV SOLN: INTRAVENOUS | Status: DC

## 2020-02-26 MED ORDER — BUPIVACAINE-EPINEPHRINE (PF) 0.5% -1:200000 IJ SOLN
INTRAMUSCULAR | Status: DC | PRN
  Administered 2020-02-26: 20 mL via PERINEURAL

## 2020-02-26 MED ORDER — TRAMADOL HCL 50 MG PO TABS: 50.0000 mg | ORAL_TABLET | Freq: Every day | ORAL | 1 refills | Status: DC | PRN

## 2020-02-26 MED ORDER — CHLORHEXIDINE GLUCONATE 0.12 % MT SOLN: 15.0000 mL | Freq: Once | OROMUCOSAL | Status: AC

## 2020-02-26 MED ORDER — LIDOCAINE HCL 1 % IJ SOLN
INTRAMUSCULAR | Status: DC | PRN
  Administered 2020-02-26: 20 mL
  Administered 2020-02-26: 10 mL

## 2020-02-26 MED ORDER — FENTANYL CITRATE (PF) 100 MCG/2ML IJ SOLN
INTRAMUSCULAR | Status: DC | PRN
Start: 2020-02-26 — End: 2020-02-26
  Administered 2020-02-26 (×2): 25 ug via INTRAVENOUS

## 2020-02-26 MED ORDER — PROPOFOL 500 MG/50ML IV EMUL
INTRAVENOUS | Status: DC | PRN
  Administered 2020-02-26: 20 mg via INTRAVENOUS
  Administered 2020-02-26: 50 ug/kg/min via INTRAVENOUS

## 2020-02-26 MED ORDER — ONDANSETRON HCL 4 MG/2ML IJ SOLN: 4.0000 mg | Freq: Four times a day (QID) | INTRAMUSCULAR | Status: DC | PRN

## 2020-02-26 MED ORDER — CEFAZOLIN SODIUM-DEXTROSE 2-4 GM/100ML-% IV SOLN
INTRAVENOUS | Status: AC
  Filled 2020-02-26: qty 100

## 2020-02-26 MED ORDER — METOCLOPRAMIDE HCL 5 MG/ML IJ SOLN: 5.0000 mg | Freq: Three times a day (TID) | INTRAMUSCULAR | Status: DC | PRN

## 2020-02-26 MED ORDER — PROPOFOL 10 MG/ML IV BOLUS
INTRAVENOUS | Status: AC
  Filled 2020-02-26: qty 20

## 2020-02-26 MED ORDER — ORAL CARE MOUTH RINSE: 15.0000 mL | Freq: Once | OROMUCOSAL | Status: AC

## 2020-02-26 MED ORDER — SODIUM CHLORIDE 0.9 % IV SOLN: INTRAVENOUS | Status: DC

## 2020-02-26 MED ORDER — DEXMEDETOMIDINE (PRECEDEX) IN NS 20 MCG/5ML (4 MCG/ML) IV SYRINGE
PREFILLED_SYRINGE | INTRAVENOUS | Status: DC | PRN
  Administered 2020-02-26: 4 ug via INTRAVENOUS

## 2020-02-26 MED ORDER — BUPIVACAINE-EPINEPHRINE (PF) 0.5% -1:200000 IJ SOLN
INTRAMUSCULAR | Status: AC
  Filled 2020-02-26: qty 30

## 2020-02-26 MED ORDER — FENTANYL CITRATE (PF) 100 MCG/2ML IJ SOLN: 25.0000 ug | INTRAMUSCULAR | Status: DC | PRN

## 2020-02-26 MED ORDER — OXYCODONE HCL 5 MG/5ML PO SOLN: 5.0000 mg | Freq: Once | ORAL | Status: DC | PRN

## 2020-02-26 MED ORDER — LIDOCAINE HCL (PF) 1 % IJ SOLN
INTRAMUSCULAR | Status: AC
  Filled 2020-02-26: qty 30

## 2020-02-26 SURGICAL SUPPLY — 23 items
ADH SKN CLS APL DERMABOND .7 (GAUZE/BANDAGES/DRESSINGS) ×1
CEMENT KYPHON CX01A KIT/MIXER (Cement) ×5 IMPLANT
COVER WAND RF STERILE (DRAPES) ×1 IMPLANT
DERMABOND ADVANCED (GAUZE/BANDAGES/DRESSINGS) ×2
DERMABOND ADVANCED .7 DNX12 (GAUZE/BANDAGES/DRESSINGS) ×1 IMPLANT
DEVICE BIOPSY BONE KYPH (INSTRUMENTS) ×2 IMPLANT
DRAPE C-ARM XRAY 36X54 (DRAPES) ×3 IMPLANT
DURAPREP 26ML APPLICATOR (WOUND CARE) ×3 IMPLANT
FEE RENTAL RFA GENERATOR (MISCELLANEOUS) IMPLANT
GLOVE SURG SYN 9.0  PF PI (GLOVE) ×3
GLOVE SURG SYN 9.0 PF PI (GLOVE) ×1 IMPLANT
GOWN SRG 2XL LVL 4 RGLN SLV (GOWNS) ×1 IMPLANT
GOWN STRL NON-REIN 2XL LVL4 (GOWNS) ×3
GOWN STRL REUS W/ TWL LRG LVL3 (GOWN DISPOSABLE) ×1 IMPLANT
GOWN STRL REUS W/TWL LRG LVL3 (GOWN DISPOSABLE) ×3
PACK KYPHOPLASTY (MISCELLANEOUS) ×3 IMPLANT
RENTAL RFA  GENERATOR (MISCELLANEOUS)
RENTAL RFA GENERATOR (MISCELLANEOUS) IMPLANT
STRAP SAFETY 5IN WIDE (MISCELLANEOUS) ×3 IMPLANT
SWABSTK COMLB BENZOIN TINCTURE (MISCELLANEOUS) ×3 IMPLANT
TRAY KYPHOPAK 15/2 EXPRESS (KITS) ×2 IMPLANT
TRAY KYPHOPAK 15/3 EXPRESS 1ST (MISCELLANEOUS) ×1 IMPLANT
TRAY KYPHOPAK 20/3 EXPRESS 1ST (MISCELLANEOUS) ×1 IMPLANT

## 2020-02-26 NOTE — Transfer of Care (Signed)
Immediate Anesthesia Transfer of Care Note  Patient: Mary Rice  Procedure(s) Performed: KYPHOPLASTY T11 (N/A )  Patient Location: PACU  Anesthesia Type:MAC  Level of Consciousness: awake, drowsy and patient cooperative  Airway & Oxygen Therapy: Patient Spontanous Breathing  Post-op Assessment: Report given to RN and Post -op Vital signs reviewed and stable  Post vital signs: Reviewed and stable  Last Vitals:  Vitals Value Taken Time  BP 137/52 02/26/20 1159  Temp    Pulse 62 02/26/20 1201  Resp 17 02/26/20 1201  SpO2 98 % 02/26/20 1201  Vitals shown include unvalidated device data.  Last Pain:  Vitals:   02/26/20 0918  TempSrc: Tympanic  PainSc: 0-No pain         Complications: No complications documented.

## 2020-02-26 NOTE — Op Note (Signed)
Date8/20/21  Time 11:58 am   PATIENT:  Mary Rice   PRE-OPERATIVE DIAGNOSIS:  closed wedge compression fracture of T11   POST-OPERATIVE DIAGNOSIS:  closed wedge compression fracture of T11   PROCEDURE:  Procedure(s): KYPHOPLASTY T11  SURGEON: Laurene Footman, MD   ASSISTANTS: None   ANESTHESIA:   local and MAC   EBL:  No intake/output data recorded.   BLOOD ADMINISTERED:none   DRAINS: none    LOCAL MEDICATIONS USED:  MARCAINE    and XYLOCAINE    SPECIMEN:   T11 vertebral body biopsy   DISPOSITION OF SPECIMEN:  Pathology   COUNTS:  YES   TOURNIQUET:  * No tourniquets in log *   IMPLANTS: Bone cement   DICTATION: .Dragon Dictation  patient was brought to the operating room and after adequate anesthesia was obtained the patient was placed prone.  C arm was brought in in good visualization of the affected level obtained on both AP and lateral projections.  After patient identification and timeout procedures were completed, local anesthetic was infiltrated with 10 cc 1% Xylocaine infiltrated subcutaneously.  This is done the area on the each side of the planned approach.  The back was then prepped and draped in the usual sterile manner and repeat timeout procedure carried out.  A spinal needle was brought down to the pedicle on the each side of  T11 and a 50-50 mix of 1% Xylocaine half percent Sensorcaine with epinephrine total of 20 cc injected on each side.  After allowing this to set a small incision was made and the trocar was advanced into the vertebral body in an extrapedicular fashion.  Biopsy was obtained Drilling was carried out balloon inserted with inflation to  2 cc on the right and 1-1/2 cc on the left.  When the cement was appropriate consistency 3 cc were injected on the right and 2-1/2 cc on the left into the vertebral body without extravasation, good fill superior to inferior endplates and from right to left sides along the inferior endplate.  After the cement had set  the trochar was removed and permanent C-arm views obtained.  The wound was closed with Dermabond followed by [place]:  Discharged home after recovery room   PATIENT DISPOSITION:  PACU - hemodynamically stable.

## 2020-02-26 NOTE — Anesthesia Postprocedure Evaluation (Signed)
Anesthesia Post Note  Patient: Mary Rice  Procedure(s) Performed: KYPHOPLASTY T11 (N/A )  Patient location during evaluation: PACU Anesthesia Type: General Level of consciousness: awake and alert and oriented Pain management: pain level controlled Vital Signs Assessment: post-procedure vital signs reviewed and stable Respiratory status: spontaneous breathing, nonlabored ventilation and respiratory function stable Cardiovascular status: blood pressure returned to baseline and stable Postop Assessment: no signs of nausea or vomiting Anesthetic complications: no   No complications documented.   Last Vitals:  Vitals:   02/26/20 1229 02/26/20 1250  BP: 137/67 (!) 178/57  Pulse: (!) 59 (!) 54  Resp: 16 16  Temp:  (!) 36.3 C  SpO2: 100% 97%    Last Pain:  Vitals:   02/26/20 1250  TempSrc: Temporal  PainSc: 0-No pain                 Shalee Paolo

## 2020-02-26 NOTE — Discharge Instructions (Addendum)
Remove Band-Aids on Sunday.  Then okay to shower. Pain medicine as directed. Try not to bend over and lift for the next week and then resume normal activities after that. Call office if you're having problems.   AMBULATORY SURGERY  DISCHARGE INSTRUCTIONS   1) The drugs that you were given will stay in your system until tomorrow so for the next 24 hours you should not:  A) Drive an automobile B) Make any legal decisions C) Drink any alcoholic beverage   2) You may resume regular meals tomorrow.  Today it is better to start with liquids and gradually work up to solid foods.  You may eat anything you prefer, but it is better to start with liquids, then soup and crackers, and gradually work up to solid foods.   3) Please notify your doctor immediately if you have any unusual bleeding, trouble breathing, redness and pain at the surgery site, drainage, fever, or pain not relieved by medication.    4) Additional Instructions:        Please contact your physician with any problems or Same Day Surgery at [phone], Monday through Friday 6 am to 4 pm, or [place] at [facility] number at [phone].

## 2020-02-26 NOTE — H&P (Signed)
Chief Complaint  Patient presents with  . Establish Care  T11 Compression fx   History of the Present Illness: Mary Rice is a 86 y.o. female here for evaluation of a compression fracture referred by Dr. Emily Filbert. She has significant scoliosis in the lumbar spine. She had recent radiographs that were unclear if there is a possible fracture. She has a T11 fracture. Lumbar MRI from 02/09/2020 does show a T11 slight compression fracture. She comes in today to discuss possible kyphoplasty.  The patient's daughter states the patient has had back pain for a long time, but she had COVID-19 in 03/2019 and has gone downhill since then. She fell on her back in the basement in 03/2019. She fell again recently hitting her back and head on 02/01/2020. The patient has had severe back pain since that time, staying in the bed all the time. She has gone from independent driving to a walker, and now she is dependent on a wheelchair. The patient's daughter states [facility] has been coming out.   The patient is now on aspirin 325 mg daily for atrial fibrillation. She has not taken Eliquis since her last visit with Dr. Sabra Heck.  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  I have reviewed past medical, surgical, social and family history, and allergies as documented in the EMR.  Past Medical History: Past Medical History:  Diagnosis Date  . History of DVT (deep vein thrombosis)  . Hypertension  . Migraines  . Reactive airway disease   Past Surgical History: Past Surgical History:  Procedure Laterality Date  . Ankle surgery  . CATARACT EXTRACTION Right 09/02/2014  . CHOLECYSTECTOMY  . HYSTERECTOMY   Past Family History: Family History  Problem Relation Age of Onset  . No Known Problems Mother  . No Known Problems Father   Medications: Current Outpatient Medications Ordered in Epic  Medication Sig Dispense Refill  . ascorbic acid, vitamin C,  (VITAMIN C) 1000 MG tablet Take 1,000 mg by mouth once daily  . aspirin 325 MG tablet Take 325 mg by mouth once daily  . atenoloL (TENORMIN) 25 MG tablet TAKE 1 TABLET DAILY 90 tablet 3  . calcium carbonate-vitamin D3 600 mg(1,500mg ) -800 unit Tab Take 1 tablet by mouth once daily  . cholecalciferol (VITAMIN D3) 2,000 unit tablet Take 2,000 Units by mouth once daily.  . MULTIVITAMIN ORAL Take by mouth once daily.  . pantoprazole (PROTONIX) 40 MG DR tablet TAKE 1 TABLET DAILY 90 tablet 3  . PARoxetine (PAXIL) 20 MG tablet Take 1 tablet (20 mg total) by mouth once daily 30 tablet 11  . pravastatin (PRAVACHOL) 20 MG tablet TAKE 1 TABLET NIGHTLY 90 tablet 3  . traMADoL (ULTRAM) 50 mg tablet Take 1 tablet by mouth as directed   No current Epic-ordered facility-administered medications on file.   Allergies: Allergies  Allergen Reactions  . Biaxin [Clarithromycin] Unknown  . Maxzide [Triamterene-Hydrochlorothiazid] Unknown  . Penicillin Rash  . Sulfa (Sulfonamide Antibiotics) Unknown    Body mass index is 31.7 kg/m.  Review of Systems: A comprehensive 14 point ROS was performed, reviewed, and the pertinent orthopaedic findings are documented in the HPI.  Vitals:  02/24/20 1446  BP: 154/66   General Physical Examination:  General/Constitutional: No apparent distress: well-nourished and well developed. Eyes: Pupils equal, round with synchronous movement. Lungs: Clear to auscultation HEENT: Normal Vascular: No edema, swelling or tenderness, except as noted in detailed exam. Cardiac: Heart rate  and rhythm is regular. Integumentary: No impressive skin lesions present, except as noted in detailed exam. Neuro/Psych: Normal mood and affect, oriented to person, place and time.  Musculoskeletal Examination: On examination, there is a Schmoral's node at T12. Tenderness over the T11 vertebrae. No clonus bilaterally. Lungs are clear. Heart rate and rhythm is normal. HEENT is normal. Full  upper and partial lower dentures.  Radiographs: Lumbar MRI from 02/09/2020 show a T11 slight compression fracture.   Assessment: ICD-10-CM  1. Thoracic compression fracture, closed, initial encounter (CMS-HCC) S22.000A   Plan: The patient has clinical findings of painful T11 fracture over 31-month-old with persisting edema on recent MRI.  We discussed the patient's prior radiograph findings. We discussed surgical intervention, as a T11 kyphoplasty and plan is to have surgery in the near future, possibly this Friday on 02/26/2020.   Surgical Risks:  The nature of the condition and the proposed procedure has been reviewed in detail with the patient. Surgical versus non-surgical options and prognosis for recovery have been reviewed and the inherent risks and benefits of each have been discussed including the risks of infection, bleeding, injury to nerves/blood vessels/tendons, incomplete relief of symptoms, persisting pain and/or stiffness, loss of function, complex regional pain syndrome, failure of the procedure, as appropriate.  Teeth: Full upper and partial lower dentures.  Scribe Attestation: Genene Churn Hoffelt, am acting as scribe for Lauris Poag, MD Reviewed paper H+P, will be scanned into chart. No changes noted.

## 2020-02-26 NOTE — Anesthesia Preprocedure Evaluation (Addendum)
Anesthesia Evaluation  Patient identified by MRN, date of birth, ID band Patient awake  General Assessment Comment:Overall poor-looking  Reviewed: Allergy & Precautions, NPO status , Patient's Chart, lab work & pertinent test results  History of Anesthesia Complications Negative for: history of anesthetic complications  Airway Mallampati: II  TM Distance: >3 FB Neck ROM: Full    Dental  (+) Edentulous Upper, Partial Lower, Dental Advisory Given, Poor Dentition   Pulmonary neg pulmonary ROS, neg sleep apnea, neg COPD, Patient abstained from smoking.Not current smoker,    Pulmonary exam normal breath sounds clear to auscultation       Cardiovascular Exercise Tolerance: Good METShypertension, (-) CAD and (-) Past MI + dysrhythmias Atrial Fibrillation  Rhythm:Irregular Rate:Normal - Systolic murmurs TTE 7622: INTERPRETATION  NORMAL LEFT VENTRICULAR SYSTOLIC FUNCTION  NORMAL RIGHT VENTRICULAR SYSTOLIC FUNCTION  MILD VALVULAR REGURGITATION (See above)  NO VALVULAR STENOSIS  Aortic sclerosis, no stenotic gradient seen     Neuro/Psych negative neurological ROS  negative psych ROS   GI/Hepatic hiatal hernia, GERD  Medicated and Controlled,(+)     (-) substance abuse  ,   Endo/Other  neg diabetes  Renal/GU negative Renal ROS     Musculoskeletal   Abdominal   Peds  Hematology   Anesthesia Other Findings Past Medical History: No date: DVT (deep venous thrombosis) (HCC) No date: Hiatal hernia No date: Hyperlipemia No date: Hypertension No date: Pulmonary embolism (HCC)  Reproductive/Obstetrics                            Anesthesia Physical Anesthesia Plan  ASA: III  Anesthesia Plan: MAC   Post-op Pain Management:    Induction: Intravenous  PONV Risk Score and Plan: 2 and Ondansetron, Propofol infusion and TIVA  Airway Management Planned: Nasal Cannula  Additional Equipment:  None  Intra-op Plan:   Post-operative Plan:   Informed Consent: I have reviewed the patients History and Physical, chart, labs and discussed the procedure including the risks, benefits and alternatives for the proposed anesthesia with the patient or authorized representative who has indicated his/her understanding and acceptance.     Dental advisory given  Plan Discussed with: CRNA and Surgeon  Anesthesia Plan Comments: (Discussed risks of anesthesia with patient, including possibility of difficulty with spontaneous ventilation under anesthesia necessitating airway intervention, PONV, and rare risks such as cardiac or respiratory or neurological events. Patient understands.)        Anesthesia Quick Evaluation

## 2020-02-27 ENCOUNTER — Encounter: Payer: Self-pay | Admitting: Orthopedic Surgery

## 2020-02-29 LAB — SURGICAL PATHOLOGY

## 2020-03-02 ENCOUNTER — Ambulatory Visit: Payer: Medicare Other

## 2020-03-13 ENCOUNTER — Emergency Department: Payer: Medicare Other

## 2020-03-13 ENCOUNTER — Other Ambulatory Visit: Payer: Self-pay

## 2020-03-13 DIAGNOSIS — J189 Pneumonia, unspecified organism: Secondary | ICD-10-CM | POA: Diagnosis not present

## 2020-03-13 DIAGNOSIS — R112 Nausea with vomiting, unspecified: Secondary | ICD-10-CM | POA: Diagnosis present

## 2020-03-13 DIAGNOSIS — I482 Chronic atrial fibrillation, unspecified: Secondary | ICD-10-CM | POA: Diagnosis present

## 2020-03-13 DIAGNOSIS — Z881 Allergy status to other antibiotic agents status: Secondary | ICD-10-CM

## 2020-03-13 DIAGNOSIS — Z8249 Family history of ischemic heart disease and other diseases of the circulatory system: Secondary | ICD-10-CM

## 2020-03-13 DIAGNOSIS — Z86711 Personal history of pulmonary embolism: Secondary | ICD-10-CM

## 2020-03-13 DIAGNOSIS — Z888 Allergy status to other drugs, medicaments and biological substances status: Secondary | ICD-10-CM

## 2020-03-13 DIAGNOSIS — Z86718 Personal history of other venous thrombosis and embolism: Secondary | ICD-10-CM

## 2020-03-13 DIAGNOSIS — R4182 Altered mental status, unspecified: Secondary | ICD-10-CM | POA: Diagnosis present

## 2020-03-13 DIAGNOSIS — E669 Obesity, unspecified: Secondary | ICD-10-CM | POA: Diagnosis present

## 2020-03-13 DIAGNOSIS — Z6830 Body mass index (BMI) 30.0-30.9, adult: Secondary | ICD-10-CM

## 2020-03-13 DIAGNOSIS — I10 Essential (primary) hypertension: Secondary | ICD-10-CM | POA: Diagnosis present

## 2020-03-13 DIAGNOSIS — W19XXXD Unspecified fall, subsequent encounter: Secondary | ICD-10-CM | POA: Diagnosis present

## 2020-03-13 DIAGNOSIS — E860 Dehydration: Secondary | ICD-10-CM | POA: Diagnosis present

## 2020-03-13 DIAGNOSIS — Z9181 History of falling: Secondary | ICD-10-CM

## 2020-03-13 DIAGNOSIS — E785 Hyperlipidemia, unspecified: Secondary | ICD-10-CM | POA: Diagnosis present

## 2020-03-13 DIAGNOSIS — Z79899 Other long term (current) drug therapy: Secondary | ICD-10-CM

## 2020-03-13 DIAGNOSIS — Z20822 Contact with and (suspected) exposure to covid-19: Secondary | ICD-10-CM | POA: Diagnosis present

## 2020-03-13 DIAGNOSIS — Z882 Allergy status to sulfonamides status: Secondary | ICD-10-CM

## 2020-03-13 DIAGNOSIS — R0902 Hypoxemia: Secondary | ICD-10-CM | POA: Diagnosis present

## 2020-03-13 DIAGNOSIS — M4854XD Collapsed vertebra, not elsewhere classified, thoracic region, subsequent encounter for fracture with routine healing: Secondary | ICD-10-CM | POA: Diagnosis present

## 2020-03-13 DIAGNOSIS — Z880 Allergy status to penicillin: Secondary | ICD-10-CM

## 2020-03-13 DIAGNOSIS — Z8616 Personal history of COVID-19: Secondary | ICD-10-CM

## 2020-03-13 DIAGNOSIS — Z7982 Long term (current) use of aspirin: Secondary | ICD-10-CM

## 2020-03-13 LAB — CBC WITH DIFFERENTIAL/PLATELET
Abs Immature Granulocytes: 0.08 10*3/uL — ABNORMAL HIGH (ref 0.00–0.07)
Basophils Absolute: 0.1 10*3/uL (ref 0.0–0.1)
Basophils Relative: 0 %
Eosinophils Absolute: 0 10*3/uL (ref 0.0–0.5)
Eosinophils Relative: 0 %
HCT: 43.4 % (ref 36.0–46.0)
Hemoglobin: 14.5 g/dL (ref 12.0–15.0)
Immature Granulocytes: 1 %
Lymphocytes Relative: 4 %
Lymphs Abs: 0.5 10*3/uL — ABNORMAL LOW (ref 0.7–4.0)
MCH: 29.7 pg (ref 26.0–34.0)
MCHC: 33.4 g/dL (ref 30.0–36.0)
MCV: 88.8 fL (ref 80.0–100.0)
Monocytes Absolute: 1.5 10*3/uL — ABNORMAL HIGH (ref 0.1–1.0)
Monocytes Relative: 12 %
Neutro Abs: 9.9 10*3/uL — ABNORMAL HIGH (ref 1.7–7.7)
Neutrophils Relative %: 83 %
Platelets: 255 10*3/uL (ref 150–400)
RBC: 4.89 MIL/uL (ref 3.87–5.11)
RDW: 17 % — ABNORMAL HIGH (ref 11.5–15.5)
WBC: 12 10*3/uL — ABNORMAL HIGH (ref 4.0–10.5)
nRBC: 0 % (ref 0.0–0.2)

## 2020-03-13 LAB — COMPREHENSIVE METABOLIC PANEL
ALT: 22 U/L (ref 0–44)
AST: 24 U/L (ref 15–41)
Albumin: 3.8 g/dL (ref 3.5–5.0)
Alkaline Phosphatase: 82 U/L (ref 38–126)
Anion gap: 12 (ref 5–15)
BUN: 19 mg/dL (ref 8–23)
CO2: 26 mmol/L (ref 22–32)
Calcium: 9.2 mg/dL (ref 8.9–10.3)
Chloride: 97 mmol/L — ABNORMAL LOW (ref 98–111)
Creatinine, Ser: 0.72 mg/dL (ref 0.44–1.00)
GFR calc Af Amer: >60 mL/min (ref >60)
GFR calc non Af Amer: >60 mL/min (ref >60)
Glucose, Bld: 131 mg/dL — ABNORMAL HIGH (ref 70–99)
Potassium: 3.8 mmol/L (ref 3.5–5.1)
Sodium: 135 mmol/L (ref 135–145)
Total Bilirubin: 1.2 mg/dL (ref 0.3–1.2)
Total Protein: 6.6 g/dL (ref 6.5–8.1)

## 2020-03-13 LAB — LACTIC ACID, PLASMA: Lactic Acid, Venous: 1.5 mmol/L (ref 0.5–1.9)

## 2020-03-13 LAB — PROTIME-INR
INR: 1 (ref 0.8–1.2)
Prothrombin Time: 12.7 seconds (ref 11.4–15.2)

## 2020-03-13 LAB — SARS CORONAVIRUS 2 BY RT PCR (HOSPITAL ORDER, PERFORMED IN ~~LOC~~ HOSPITAL LAB): SARS Coronavirus 2: NEGATIVE

## 2020-03-13 NOTE — ED Triage Notes (Signed)
Patient brought in by daughter for AMS. Patient normally completely AO X 4 at baseline and lives at home with spouse (patient's father). Patient's father called daughter to bring patient to hospital for altered behavior. Patient found in pile of vomit moaning. Patient's stomach distended per daughter, patient moaning, not following instructions, and irritable. Patient has hx of low sodium, takes sodium tablets daily. Patient had back surgery with [facility] on 8/20.

## 2020-03-14 ENCOUNTER — Emergency Department: Payer: Medicare Other

## 2020-03-14 ENCOUNTER — Encounter: Payer: Self-pay | Admitting: Radiology

## 2020-03-14 ENCOUNTER — Inpatient Hospital Stay
Admission: EM | Admit: 2020-03-14 | Discharge: 2020-03-15 | DRG: 194 | Disposition: A | Discharge status: Home Health | Payer: Medicare Other | Attending: Family Medicine | Admitting: Family Medicine

## 2020-03-14 DIAGNOSIS — Z881 Allergy status to other antibiotic agents status: Secondary | ICD-10-CM | POA: Diagnosis not present

## 2020-03-14 DIAGNOSIS — Z20822 Contact with and (suspected) exposure to covid-19: Secondary | ICD-10-CM | POA: Diagnosis present

## 2020-03-14 DIAGNOSIS — Z86718 Personal history of other venous thrombosis and embolism: Secondary | ICD-10-CM | POA: Diagnosis not present

## 2020-03-14 DIAGNOSIS — I10 Essential (primary) hypertension: Secondary | ICD-10-CM | POA: Diagnosis present

## 2020-03-14 DIAGNOSIS — G9340 Encephalopathy, unspecified: Secondary | ICD-10-CM | POA: Diagnosis not present

## 2020-03-14 DIAGNOSIS — R112 Nausea with vomiting, unspecified: Secondary | ICD-10-CM

## 2020-03-14 DIAGNOSIS — J189 Pneumonia, unspecified organism: Secondary | ICD-10-CM | POA: Diagnosis present

## 2020-03-14 DIAGNOSIS — J9601 Acute respiratory failure with hypoxia: Secondary | ICD-10-CM | POA: Diagnosis not present

## 2020-03-14 DIAGNOSIS — E860 Dehydration: Secondary | ICD-10-CM | POA: Diagnosis present

## 2020-03-14 DIAGNOSIS — Z8249 Family history of ischemic heart disease and other diseases of the circulatory system: Secondary | ICD-10-CM | POA: Diagnosis not present

## 2020-03-14 DIAGNOSIS — M4854XD Collapsed vertebra, not elsewhere classified, thoracic region, subsequent encounter for fracture with routine healing: Secondary | ICD-10-CM | POA: Diagnosis present

## 2020-03-14 DIAGNOSIS — I4891 Unspecified atrial fibrillation: Secondary | ICD-10-CM | POA: Diagnosis not present

## 2020-03-14 DIAGNOSIS — Z7982 Long term (current) use of aspirin: Secondary | ICD-10-CM | POA: Diagnosis not present

## 2020-03-14 DIAGNOSIS — Z8616 Personal history of COVID-19: Secondary | ICD-10-CM | POA: Diagnosis not present

## 2020-03-14 DIAGNOSIS — Z79899 Other long term (current) drug therapy: Secondary | ICD-10-CM | POA: Diagnosis not present

## 2020-03-14 DIAGNOSIS — A419 Sepsis, unspecified organism: Secondary | ICD-10-CM

## 2020-03-14 DIAGNOSIS — E785 Hyperlipidemia, unspecified: Secondary | ICD-10-CM | POA: Diagnosis present

## 2020-03-14 DIAGNOSIS — R4182 Altered mental status, unspecified: Secondary | ICD-10-CM | POA: Diagnosis present

## 2020-03-14 DIAGNOSIS — R410 Disorientation, unspecified: Secondary | ICD-10-CM

## 2020-03-14 DIAGNOSIS — Z9181 History of falling: Secondary | ICD-10-CM | POA: Diagnosis not present

## 2020-03-14 DIAGNOSIS — Z6830 Body mass index (BMI) 30.0-30.9, adult: Secondary | ICD-10-CM | POA: Diagnosis not present

## 2020-03-14 DIAGNOSIS — W19XXXD Unspecified fall, subsequent encounter: Secondary | ICD-10-CM | POA: Diagnosis present

## 2020-03-14 DIAGNOSIS — R0902 Hypoxemia: Secondary | ICD-10-CM | POA: Diagnosis present

## 2020-03-14 DIAGNOSIS — J690 Pneumonitis due to inhalation of food and vomit: Secondary | ICD-10-CM

## 2020-03-14 DIAGNOSIS — Z86711 Personal history of pulmonary embolism: Secondary | ICD-10-CM | POA: Diagnosis not present

## 2020-03-14 DIAGNOSIS — Z888 Allergy status to other drugs, medicaments and biological substances status: Secondary | ICD-10-CM | POA: Diagnosis not present

## 2020-03-14 DIAGNOSIS — Z882 Allergy status to sulfonamides status: Secondary | ICD-10-CM | POA: Diagnosis not present

## 2020-03-14 DIAGNOSIS — I482 Chronic atrial fibrillation, unspecified: Secondary | ICD-10-CM | POA: Diagnosis present

## 2020-03-14 DIAGNOSIS — Z880 Allergy status to penicillin: Secondary | ICD-10-CM | POA: Diagnosis not present

## 2020-03-14 DIAGNOSIS — E669 Obesity, unspecified: Secondary | ICD-10-CM | POA: Diagnosis present

## 2020-03-14 HISTORY — DX: Cardiac arrhythmia, unspecified: I49.9

## 2020-03-14 LAB — CBC
HCT: 40.3 % (ref 36.0–46.0)
Hemoglobin: 13.4 g/dL (ref 12.0–15.0)
MCH: 29.3 pg (ref 26.0–34.0)
MCHC: 33.3 g/dL (ref 30.0–36.0)
MCV: 88 fL (ref 80.0–100.0)
Platelets: 227 10*3/uL (ref 150–400)
RBC: 4.58 MIL/uL (ref 3.87–5.11)
RDW: 17.2 % — ABNORMAL HIGH (ref 11.5–15.5)
WBC: 9 10*3/uL (ref 4.0–10.5)
nRBC: 0 % (ref 0.0–0.2)

## 2020-03-14 LAB — URINALYSIS, COMPLETE (UACMP) WITH MICROSCOPIC
Bilirubin Urine: NEGATIVE
Glucose, UA: NEGATIVE mg/dL
Hgb urine dipstick: NEGATIVE
Ketones, ur: 20 mg/dL — AB
Leukocytes,Ua: NEGATIVE
Nitrite: NEGATIVE
Protein, ur: 100 mg/dL — AB
Specific Gravity, Urine: 1.023 (ref 1.005–1.030)
Squamous Epithelial / HPF: NONE SEEN (ref 0–5)
pH: 5 (ref 5.0–8.0)

## 2020-03-14 LAB — BASIC METABOLIC PANEL
Anion gap: 10 (ref 5–15)
BUN: 18 mg/dL (ref 8–23)
CO2: 23 mmol/L (ref 22–32)
Calcium: 8.4 mg/dL — ABNORMAL LOW (ref 8.9–10.3)
Chloride: 103 mmol/L (ref 98–111)
Creatinine, Ser: 0.66 mg/dL (ref 0.44–1.00)
GFR calc Af Amer: >60 mL/min (ref >60)
GFR calc non Af Amer: >60 mL/min (ref >60)
Glucose, Bld: 108 mg/dL — ABNORMAL HIGH (ref 70–99)
Potassium: 4.2 mmol/L (ref 3.5–5.1)
Sodium: 136 mmol/L (ref 135–145)

## 2020-03-14 LAB — PROCALCITONIN: Procalcitonin: <0.1 ng/mL

## 2020-03-14 MED ORDER — SODIUM CHLORIDE 0.9 % IV SOLN: 1.0000 g | Freq: Three times a day (TID) | INTRAVENOUS | Status: DC

## 2020-03-14 MED ORDER — SODIUM CHLORIDE 0.9 % IV SOLN
2.0000 g | Freq: Three times a day (TID) | INTRAVENOUS | Status: DC
  Administered 2020-03-14 (×2): 2 g via INTRAVENOUS
  Filled 2020-03-14 (×5): qty 2

## 2020-03-14 MED ORDER — METRONIDAZOLE IN NACL 5-0.79 MG/ML-% IV SOLN
500.0000 mg | Freq: Once | INTRAVENOUS | Status: AC
  Administered 2020-03-14: 500 mg via INTRAVENOUS
  Filled 2020-03-14: qty 100

## 2020-03-14 MED ORDER — ATENOLOL 25 MG PO TABS
25.0000 mg | ORAL_TABLET | Freq: Every day | ORAL | Status: DC
  Administered 2020-03-15: 25 mg via ORAL
  Filled 2020-03-14: qty 1

## 2020-03-14 MED ORDER — VITAMIN D3 25 MCG (1000 UNIT) PO TABS
2000.0000 [IU] | ORAL_TABLET | Freq: Every day | ORAL | Status: DC
  Administered 2020-03-14 – 2020-03-15 (×2): 2000 [IU] via ORAL
  Filled 2020-03-14 (×4): qty 2

## 2020-03-14 MED ORDER — PRAVASTATIN SODIUM 20 MG PO TABS
20.0000 mg | ORAL_TABLET | Freq: Every day | ORAL | Status: DC
  Administered 2020-03-14: 20 mg via ORAL
  Filled 2020-03-14 (×2): qty 1

## 2020-03-14 MED ORDER — ADULT MULTIVITAMIN W/MINERALS CH
1.0000 | ORAL_TABLET | Freq: Every day | ORAL | Status: DC
  Administered 2020-03-14 – 2020-03-15 (×2): 1 via ORAL
  Filled 2020-03-14 (×2): qty 1

## 2020-03-14 MED ORDER — VANCOMYCIN HCL IN DEXTROSE 1-5 GM/200ML-% IV SOLN
1000.0000 mg | Freq: Once | INTRAVENOUS | Status: AC
  Administered 2020-03-14: 1000 mg via INTRAVENOUS
  Filled 2020-03-14: qty 200

## 2020-03-14 MED ORDER — APIXABAN 5 MG PO TABS
5.0000 mg | ORAL_TABLET | Freq: Two times a day (BID) | ORAL | Status: DC
  Administered 2020-03-14 (×2): 5 mg via ORAL
  Filled 2020-03-14 (×4): qty 1

## 2020-03-14 MED ORDER — IOHEXOL 350 MG/ML SOLN
100.0000 mL | Freq: Once | INTRAVENOUS | Status: AC | PRN
  Administered 2020-03-14: 100 mL via INTRAVENOUS

## 2020-03-14 MED ORDER — LACTATED RINGERS IV SOLN: INTRAVENOUS | Status: DC

## 2020-03-14 MED ORDER — VANCOMYCIN HCL IN DEXTROSE 1-5 GM/200ML-% IV SOLN
1000.0000 mg | INTRAVENOUS | Status: DC
  Administered 2020-03-15: 1000 mg via INTRAVENOUS
  Filled 2020-03-14 (×2): qty 200

## 2020-03-14 MED ORDER — ALBUTEROL SULFATE (2.5 MG/3ML) 0.083% IN NEBU: 2.5000 mg | INHALATION_SOLUTION | RESPIRATORY_TRACT | Status: DC | PRN

## 2020-03-14 MED ORDER — CALCIUM CARBONATE ANTACID 500 MG PO CHEW: 1.0000 | CHEWABLE_TABLET | Freq: Every day | ORAL | Status: DC | PRN

## 2020-03-14 MED ORDER — VANCOMYCIN HCL 750 MG/150ML IV SOLN: 750.0000 mg | Freq: Two times a day (BID) | INTRAVENOUS | Status: DC

## 2020-03-14 MED ORDER — SODIUM CHLORIDE 0.9 % IV SOLN
1.0000 g | Freq: Once | INTRAVENOUS | Status: AC
  Administered 2020-03-14: 1 g via INTRAVENOUS
  Filled 2020-03-14: qty 1

## 2020-03-14 MED ORDER — ASCORBIC ACID 500 MG PO TABS
1000.0000 mg | ORAL_TABLET | Freq: Every day | ORAL | Status: DC
  Administered 2020-03-14 – 2020-03-15 (×2): 1000 mg via ORAL
  Filled 2020-03-14 (×2): qty 2

## 2020-03-14 MED ORDER — ENOXAPARIN SODIUM 40 MG/0.4ML ~~LOC~~ SOLN: 40.0000 mg | SUBCUTANEOUS | Status: DC

## 2020-03-14 MED ORDER — LACTATED RINGERS IV BOLUS
1000.0000 mL | Freq: Once | INTRAVENOUS | Status: AC
  Administered 2020-03-14: 1000 mL via INTRAVENOUS

## 2020-03-14 MED ORDER — METRONIDAZOLE IN NACL 5-0.79 MG/ML-% IV SOLN
500.0000 mg | Freq: Three times a day (TID) | INTRAVENOUS | Status: DC
  Administered 2020-03-14 – 2020-03-15 (×3): 500 mg via INTRAVENOUS
  Filled 2020-03-14 (×5): qty 100

## 2020-03-14 NOTE — Consult Note (Signed)
[place] for apixaban Indication: atrial fibrillation  Patient Measurements: Height: 5\' 8"  (172.7 cm) Weight: 90.7 kg (199 lb 15.3 oz) IBW/kg (Calculated) : 63.9   Vital Signs: Temp: 98.9 F (37.2 C) (09/06 0145) Temp Source: Oral (09/06 0145) BP: 129/57 (09/06 0700) Pulse Rate: 54 (09/06 0700)  Labs: Recent Labs    03/13/20 1955  HGB 14.5  HCT 43.4  PLT 255  LABPROT 12.7  INR 1.0  CREATININE 0.72    Estimated Creatinine Clearance: 60.5 mL/min (by C-G formula based on SCr of 0.72 mg/dL).   Medical History: Past Medical History:  Diagnosis Date  . DVT (deep venous thrombosis) ([place])   . Dysrhythmia   . Hiatal hernia   . Hyperlipemia   . Hypertension   . Pulmonary embolism ([facility])     Medications:  Scheduled:  . apixaban  5 mg Oral BID  . vitamin C  1,000 mg Oral Daily  . atenolol  25 mg Oral Daily  . Cholecalciferol  2,000 Units Oral Daily  . multivitamin with minerals  1 tablet Oral Daily  . pravastatin  20 mg Oral QHS    Assessment: 86 y.o. female with PMH of provoked DVT/PE and not on anticoagulation, hypertension, hyperlipidemia, A. fib who presents by EMS with altered mental status. She had a fall which resulted in thoracic compression fracture for which she underwent kyphoplasty 17 days ago by Dr. Rudene Christians.  According to notes, patient had one DVT and PE after knee surgery several years ago. Since a fall a few months ago she was taken off eliquis at that time with no plan to ever resume anticoagulation. No evidence of significant pulmonary embolus on CT angio today. H&H, platelets WNL at baseline  Goal of Therapy:  Monitor platelets by anticoagulation protocol: Yes   Plan:   Re-start apixaban 5 mg po BID (she has only 1/3 dose reduction criteria)  CBC at least every 3 days per protocol  Dallie Piles 03/14/2020,8:05 AM

## 2020-03-14 NOTE — Progress Notes (Signed)
Pharmacy Antibiotic Note  Mary Rice is a 86 y.o. female with PMH of provoked DVT/PE and not on anticoagulation, hypertension, hyperlipidemia, A. fib whopresents by EMS withaltered mental status admitted on 03/14/2020 with pneumonia.  Pharmacy has been consulted for Vancomycin dosing. She received a 1000 mg vancomycin loading dose in the ED  Plan:  1) start vancomycin 1000 mg IV Q 24 hrs  Ke: 0.047 h-1, T1/2: 14.6h  Css (calculated): 32.4/10.9 mcg/mL  Daily SCr while on vancomycin  MRSA PCR  Vancomycin levels as clinically needed  2) adjust cefepime dose to 2 grams IV every 8 hours  Height: 5\' 8"  (172.7 cm) Weight: 90.7 kg (199 lb 15.3 oz) IBW/kg (Calculated) : 63.9  Temp (24hrs), Avg:98.9 F (37.2 C), Min:98.9 F (37.2 C), Max:98.9 F (37.2 C)  Recent Labs  Lab 03/13/20 1955 03/13/20 2155 03/14/20 0723  WBC 12.0*  --  9.0  CREATININE 0.72  --  0.66  LATICACIDVEN  --  1.5  --     Estimated Creatinine Clearance: 60.5 mL/min (by C-G formula based on SCr of 0.66 mg/dL).    Allergies  Allergen Reactions  . Biaxin [Clarithromycin]     Reaction unknown  . Maxzide [Hydrochlorothiazide W-Triamterene]     Reaction unknown  . Penicillins Rash  . Sulfa Antibiotics Rash    Antimicrobials this admission: 9/6 vancomycin  >>  9/6 metronidazole  >>  9/6 cefepime >>  Microbiology results: 9/5 BCx: NG < 12 hours  9/5 SARS CoV-2: negative  9/6 MRSA PCR: pending  Thank you for allowing pharmacy to be a part of this patient's care.  Dallie Piles 03/14/2020 9:10 AM

## 2020-03-14 NOTE — ED Notes (Signed)
Dr. Tonie Griffith notified via secure chat regarding pt's blood pressure of 98/52 and hr dipping into 30s. md states will place fluid bolus.

## 2020-03-14 NOTE — Evaluation (Signed)
Clinical/Bedside Swallow Evaluation Patient Details  Name: EMMALYNE GIACOMO MRN: [id number] Date of Birth: 12-19-1932  Today's Date: 03/14/2020 Time: SLP Start Time (ACUTE ONLY): 1205 SLP Stop Time (ACUTE ONLY): 1255 SLP Time Calculation (min) (ACUTE ONLY): 50 min  Past Medical History:  Past Medical History:  Diagnosis Date  . DVT (deep venous thrombosis) ([place])   . Dysrhythmia   . Hiatal hernia   . Hyperlipemia   . Hypertension   . Pulmonary embolism [facility])    Past Surgical History:  Past Surgical History:  Procedure Laterality Date  . ABDOMINAL HYSTERECTOMY    . CATARACT EXTRACTION    . CHOLECYSTECTOMY    . ESOPHAGOGASTRODUODENOSCOPY N/A 08/19/2015   Procedure: ESOPHAGOGASTRODUODENOSCOPY (EGD);  Surgeon: Mauri Pole, MD;  Location: [facility] ENDOSCOPY;  Service: Endoscopy;  Laterality: N/A;  . KYPHOPLASTY N/A 02/26/2020   Procedure: KYPHOPLASTY T11;  Surgeon: Hessie Knows, MD;  Location: [facility] ORS;  Service: Orthopedics;  Laterality: N/A;   HPI:  Pt is a 86 y.o. female with a history of provoked DVT/PE and not on anticoagulation, hypertension, Reflux, Hiatal Hernia, hyperlipidemia, A. fib who was brought in by her daughter for altered mental status.  According to the daughter patient has had progressively worsening altered mental status for several weeks.   She had a fall which resulted in thoracic compression fracture for which she underwent kyphoplasty 17 days ago by Dr. Rudene Christians.  At admit, patient was more confused than normal; pt's stomach distended per daughter, patient moaning, not following instructions, and irritable.  The daughter found her laying on a pool of vomit s/p episode of N/V.  She called 911 and when EMS arrived patient was satting in the low 80s.  She was placed on 2 L of oxygen.  Patient has had no cough or fever.  Daughter was concerned that she was constipation.  Head CT revealed: "Atrophy, chronic microvascular disease".  Pt is alert and cohenent at this evaluation;  engaging w/ others and following instructions and feeding self w/ setup and min cues.  CT Abd.: "Airspace disease in the right upper lung and right middle lung; Esophageal HIatal Hernia".     Assessment / Plan / Recommendation Clinical Impression  Pt appears to present w/ adequate oropharyngeal phase swallow function w/ No oropharyngeal phase dysphagia noted, No neuromuscular deficits noted.Pt consumed po trials w/ No overt, clinical s/s of aspiration during po trials. Pt appears at reduced risk for aspiration when following general aspiration precautions. HOwever, pt does have a Baseline h/o REFLUX and Esophageal Hiatal Hernia per chart notes -- ANY Esophageal dysmotility such as REFLUX or N/V (as pt experienced the day of admit) can increase risk for aspiration of BACKFLOW Reflux or Vomit material thus increasing risk for aspiration and Pumonary impact. Pt is on a PPI Baseline. During po trials, pt consumed all consistencies w/ no overt coughing, decline in vocal quality, or change in respiratory presentation during/post trials. Oral phase appeared [facility] w/ timely bolus management, mastication, and control of bolus propulsion for A-P transfer for swallowing. Oral clearing achieved w/ all trial consistencies. OM Exam appeared [facility] w/ no unilateral weakness noted. Speech Clear; min cues given for attention to tasks. Pt fed self w/ setup support. Recommend a Regular consistency diet w/ well-Cut meats, moistened foods; Thin liquidsvia Cup; monitor straw use. Recommend general aspiration precautions, Pills WHOLE in Puree for safer, easier swallowing as pt described Larger pills causing difficulty to swallow. REFLUX precautions; PPI. Education given on Pills in Puree; food consistencies and  easy to eat options; general aspiration and Reflux precautions. NSG to reconsult if any new needs arise while admitted. NSG agreed.  SLP Visit Diagnosis: Dysphagia, unspecified (R13.10) (gi discomfort w/ N/V)    Aspiration  Risk   (reduced )    Diet Recommendation  Regular diet w/ well-Cut meats, gravies to moisten foods; Thin liquids. General aspiration precautions; REFLUX precautions d/t Hiatal Hernia, Reflux on PPI  Medication Administration: Whole meds with puree (as needed for ease of swallowing; chewable or liquid forms)    Other  Recommendations Recommended Consults: Consider GI evaluation;Consider esophageal assessment (as needed; PPI. Dietician f/u as needed) Oral Care Recommendations: Oral care BID;Oral care before and after PO;Staff/trained caregiver to provide oral care Other Recommendations:  (n/a)   Follow up Recommendations None      Frequency and Duration  (n/a)   (n/a)       Prognosis Prognosis for Safe Diet Advancement: Good Barriers to Reach Goals:  (baseline Esophageal dysmotility; Hiatal Hernia)      Swallow Study   General Date of Onset: 03/13/20 HPI: Pt is a 85 y.o. female with a history of provoked DVT/PE and not on anticoagulation, hypertension, Reflux, Hiatal Hernia, hyperlipidemia, A. fib who was brought in by her daughter for altered mental status.  According to the daughter patient has had progressively worsening altered mental status for several weeks.   She had a fall which resulted in thoracic compression fracture for which she underwent kyphoplasty 17 days ago by Dr. Rudene Christians.  At admit, patient was more confused than normal; pt's stomach distended per daughter, patient moaning, not following instructions, and irritable.  The daughter found her laying on a pool of vomit s/p episode of N/V.  She called 911 and when EMS arrived patient was satting in the low 80s.  She was placed on 2 L of oxygen.  Patient has had no cough or fever.  Daughter was concerned that she was constipation.  Head CT revealed: "Atrophy, chronic microvascular disease".  Pt is alert and cohenent at this evaluation; engaging w/ others and following instructions and feeding self w/ setup and min cues.  CT Abd.:  "Airspace disease in the right upper lung and right middle lung; Esophageal HIatal Hernia".   Type of Study: Bedside Swallow Evaluation Previous Swallow Assessment: none Diet Prior to this Study: NPO (regular diet at home) Temperature Spikes Noted: No (wbc 9.0) Respiratory Status: Nasal cannula (2L but removed at end of eval by NSG) History of Recent Intubation: No Behavior/Cognition: Alert;Cooperative;Pleasant mood;Distractible;Requires cueing (chuckled/laughed often) Oral Cavity Assessment: Within Functional Limits Oral Care Completed by SLP: Yes Oral Cavity - Dentition: Adequate natural dentition (missing few) Vision: Functional for self-feeding Self-Feeding Abilities: Able to feed self;Needs assist;Needs set up Patient Positioning: Upright in bed (needed min positioning support) Baseline Vocal Quality: Normal (chuckled/laughed often) Volitional Cough: Strong Volitional Swallow: Able to elicit    Oral/Motor/Sensory Function Overall Oral Motor/Sensory Function: Within functional limits   Ice Chips Ice chips: Within functional limits Presentation: Spoon (fed; 2 trials)   Thin Liquid Thin Liquid: Within functional limits Presentation: Cup;Self Fed (~10+ trials)    Nectar Thick Nectar Thick Liquid: Not tested   Honey Thick Honey Thick Liquid: Not tested   Puree Puree: Within functional limits Presentation: Self Fed;Spoon (4 ozs)   Solid     Solid: Within functional limits Presentation: Self Fed;Spoon (5 trials)       Orinda Kenner, MS, CCC-SLP Speech Language Pathologist Rehab Services [phone] Felder Lebeda 03/14/2020,1:28 PM

## 2020-03-14 NOTE — ED Provider Notes (Signed)
[facility] Emergency Department Provider Note  ____________________________________________  Time seen: Approximately 2:45 AM  I have reviewed the triage vital signs and the nursing notes.   HISTORY  Chief Complaint Altered Mental Status  Level 5 caveat:  Portions of the history and physical were unable to be obtained due to AMS   HPI Mary Rice is a 87 y.o. female with a history of provoked DVT/PE and not on anticoagulation, hypertension, hyperlipidemia, A. fib who was brought in by her daughter for altered mental status.  According to the daughter patient has had progressively worsening altered mental status for several weeks.   She had a fall which resulted in thoracic compression fracture for which she underwent kyphoplasty 17 days ago by Dr. Rudene Christians.  Today patient was more confused than normal and try to be physically aggressive towards the daughter which is atypical.  The daughter found her laying on a pool of vomit.  She called 911 and when EMS arrived patient was satting in the low 80s.  She was placed on 2 L of oxygen.  Patient has had no cough or fever.  Daughter was concerned that she was constipation however she had a large bowel movement upon arrival to the emergency room.  According to the daughter, patient had one DVT and PE after knee surgery several years ago.  Since the fall a few months ago she was taken out of all of her blood thinners.  She has not been vaccinated for Covid.  Past Medical History:  Diagnosis Date  . DVT (deep venous thrombosis) ([place])   . Hiatal hernia   . Hyperlipemia   . Hypertension   . Pulmonary embolism [facility])     Patient Active Problem List   Diagnosis Date Noted  . Atrial fibrillation ([place]) 10/10/2015  . SOB (shortness of breath) 10/10/2015  . Obesity 10/10/2015  . Hyperlipidemia 10/10/2015  . Essential hypertension 10/10/2015  . Food impaction of esophagus     Past Surgical History:  Procedure Laterality  Date  . ABDOMINAL HYSTERECTOMY    . CATARACT EXTRACTION    . CHOLECYSTECTOMY    . ESOPHAGOGASTRODUODENOSCOPY N/A 08/19/2015   Procedure: ESOPHAGOGASTRODUODENOSCOPY (EGD);  Surgeon: Mauri Pole, MD;  Location: [facility] ENDOSCOPY;  Service: Endoscopy;  Laterality: N/A;  . KYPHOPLASTY N/A 02/26/2020   Procedure: KYPHOPLASTY T11;  Surgeon: Hessie Knows, MD;  Location: [facility] ORS;  Service: Orthopedics;  Laterality: N/A;    Prior to Admission medications   Medication Sig Start Date End Date Taking? Authorizing Provider  acetaminophen (TYLENOL) 650 MG CR tablet Take 1,300 mg by mouth every 8 (eight) hours as needed for pain.   Yes [provider]  Ascorbic Acid (VITAMIN C) 1000 MG tablet Take 1,000 mg by mouth daily.   Yes [provider]  aspirin EC 325 MG tablet Take 325 mg by mouth daily.   Yes [provider]  atenolol (TENORMIN) 25 MG tablet Take 25 mg by mouth daily. 08/09/15  Yes [provider]  Calcium Carb-Cholecalciferol (CALCIUM 600 + D PO) Take 1 tablet by mouth daily.   Yes [provider]  calcium carbonate (TUMS - DOSED IN MG ELEMENTAL CALCIUM) 500 MG chewable tablet Chew 1 tablet by mouth daily as needed for indigestion or heartburn.   Yes [provider]  Cholecalciferol 50 MCG (2000 UT) TABS Take 2,000 Units by mouth daily.   Yes [provider]  docusate sodium (COLACE) 100 MG capsule Take 200 mg by mouth  at bedtime.   Yes [provider]  etodolac (LODINE) 400 MG tablet Take 400 mg by mouth daily.   Yes [provider]  Multiple Vitamin (MULTIVITAMIN WITH MINERALS) TABS tablet Take 1 tablet by mouth daily.   Yes [provider]  pantoprazole (PROTONIX) 40 MG tablet Take 40 mg by mouth daily.   Yes [provider]  polyethylene glycol (MIRALAX / GLYCOLAX) 17 g packet Take 17 g by mouth daily.   Yes [provider]  pravastatin (PRAVACHOL) 20 MG tablet Take 20 mg by mouth at  bedtime.    Yes [provider]  sodium chloride 1 g tablet Take 1 g by mouth daily.   Yes [provider]  traMADol (ULTRAM) 50 MG tablet Take 1 tablet (50 mg total) by mouth daily as needed for moderate pain. Patient taking differently: Take 25 mg by mouth 3 (three) times daily.  02/26/20  Yes Hessie Knows, MD  ELIQUIS 5 MG TABS tablet Take 5 mg by mouth 2 (two) times daily. Patient not taking: Reported on 03/14/2020 03/03/20   [provider]  PARoxetine (PAXIL) 20 MG tablet Take 20 mg by mouth daily. Patient not taking: Reported on 03/14/2020    [provider]    Allergies Biaxin [clarithromycin], Maxzide [hydrochlorothiazide w-triamterene], Penicillins, and Sulfa antibiotics  Family History  Problem Relation Age of Onset  . Heart failure Mother     Social History Social History   Tobacco Use  . Smoking status: Never Smoker  . Smokeless tobacco: Never Used  Substance Use Topics  . Alcohol use: No  . Drug use: No    Review of Systems  Constitutional: Negative for fever. + AMS Respiratory: Negative for shortness of breath. Gastrointestinal: + vomiting.  Level 5 caveat:  Portions of the history and physical were unable to be obtained due to AMS  ____________________________________________   PHYSICAL EXAM:  VITAL SIGNS: ED Triage Vitals  Enc Vitals Group     BP 03/13/20 2000 120/65     Pulse Rate 03/13/20 2000 90     Resp 03/13/20 2000 (!) 24     Temp 03/13/20 2000 98.9 F (37.2 C)     Temp Source 03/14/20 0145 Oral     SpO2 03/13/20 2000 94 %     Weight 03/13/20 2000 199 lb 15.3 oz (90.7 kg)     Height 03/13/20 2000 5\' 8"  (1.727 m)     Head Circumference --      Peak Flow --      Pain Score --      Pain Loc --      Pain Edu? --      Excl. in [place]? --     Constitutional: Alert, confused, no distress. HEENT:      Head: Normocephalic and atraumatic.         Eyes: Conjunctivae are normal. Sclera is non-icteric.        Mouth/Throat: Mucous membranes are dry.       Neck: Supple with no signs of meningismus. Cardiovascular: Regular rate and rhythm. No murmurs, gallops, or rubs.  Respiratory: Tachypneic and hypoxic on room air which improved on 2 L nasal cannula, lungs are clear to auscultation with good air movement Gastrointestinal: Soft, diffusely tender to palpation, and non distended with positive bowel sounds. No rebound or guarding. Musculoskeletal: No edema, cyanosis, or erythema of extremities.  No CT and L-spine tenderness, surgical sites are well-healing with no signs of infection Neurologic: Face is symmetric.  Moving all extremities.  Skin: Skin is warm, dry and intact. No rash noted. Psychiatric: Mood and affect are normal. Speech and behavior are normal.  ____________________________________________   LABS (all labs ordered are listed, but only abnormal results are displayed)  Labs Reviewed  COMPREHENSIVE METABOLIC PANEL - Abnormal; Notable for the following components:      Result Value   Chloride 97 (*)    Glucose, Bld 131 (*)    All other components within normal limits  CBC WITH DIFFERENTIAL/PLATELET - Abnormal; Notable for the following components:   WBC 12.0 (*)    RDW 17.0 (*)    Neutro Abs 9.9 (*)    Lymphs Abs 0.5 (*)    Monocytes Absolute 1.5 (*)    Abs Immature Granulocytes 0.08 (*)    All other components within normal limits  URINALYSIS, COMPLETE (UACMP) WITH MICROSCOPIC - Abnormal; Notable for the following components:   Color, Urine YELLOW (*)    APPearance CLOUDY (*)    Ketones, ur 20 (*)    Protein, ur 100 (*)    Bacteria, UA RARE (*)    All other components within normal limits  SARS CORONAVIRUS 2 BY RT PCR (HOSPITAL ORDER, [facility])  CULTURE, BLOOD (ROUTINE X 2)  CULTURE, BLOOD (ROUTINE X 2)  LACTIC ACID, PLASMA  PROTIME-INR  PROCALCITONIN   ____________________________________________  EKG  ED ECG REPORT I, Rudene Re, the attending physician, personally viewed and interpreted this ECG.  Atrial fibrillation, rate of 84, normal QTC, no ST elevations or depressions. ____________________________________________  RADIOLOGY  I have personally reviewed the images performed during this visit and I agree with the Radiologist's read.   Interpretation by Radiologist:  DG Chest 1 View  Result Date: 03/13/2020 CLINICAL DATA:  Altered mental status EXAM: CHEST  1 VIEW COMPARISON:  08/21/2016 FINDINGS: There is focal pulmonary infiltrate noted within the right upper lobe, likely infectious in the appropriate clinical setting. No pneumothorax or pleural effusion. Cardiac size within normal limits. Pulmonary vascularity is normal. No acute bone abnormality. IMPRESSION: Focal pulmonary infiltrate in the right upper lobe, likely infectious in the appropriate clinical setting. Electronically Signed   By: Fidela Salisbury MD   On: 03/13/2020 21:10   DG Abdomen 1 View  Result Date: 03/13/2020 CLINICAL DATA:  Ileus EXAM: ABDOMEN - 1 VIEW COMPARISON:  None. FINDINGS: Normal abdominal gas pattern. Moderate stool within the rectal vault. No gross free intraperitoneal gas. Cholecystectomy clips noted within the right upper quadrant. T11 vertebroplasty has been performed. Moderate lumbar levoscoliosis. No acute bone abnormality. IMPRESSION: Moderate stool within the rectal vault. No evidence of bowel obstruction or free intraperitoneal gas. Electronically Signed   By: Fidela Salisbury MD   On: 03/13/2020 21:12   CT Head Wo Contrast  Result Date: 03/13/2020 CLINICAL DATA:  Altered mental status EXAM: CT HEAD WITHOUT CONTRAST TECHNIQUE: Contiguous axial images were obtained from the base of the skull through the vertex without intravenous contrast. COMPARISON:  None. FINDINGS: Brain: There is atrophy and chronic small vessel disease changes. No acute intracranial abnormality. Specifically, no hemorrhage, hydrocephalus, mass lesion,  acute infarction, or significant intracranial injury. Vascular: No hyperdense vessel or unexpected calcification. Skull: No acute calvarial abnormality. Sinuses/Orbits: Visualized paranasal sinuses and mastoids clear. Orbital soft tissues unremarkable. Other: None IMPRESSION: Atrophy, chronic microvascular disease. No acute intracranial abnormality. Electronically Signed   By: Rolm Baptise M.D.   On: 03/13/2020 21:14   CT Angio Chest PE W and/or Wo Contrast  Result Date: 03/14/2020 CLINICAL DATA:  Altered mental status. Vomiting. Distended stomach. High probability for pulmonary embolus. EXAM: CT ANGIOGRAPHY CHEST CT ABDOMEN AND PELVIS WITH CONTRAST TECHNIQUE: Multidetector CT imaging of the chest was performed using the standard protocol during bolus administration of intravenous contrast. Multiplanar CT image reconstructions and MIPs were obtained to evaluate the vascular anatomy. Multidetector CT imaging of the abdomen and pelvis was performed using the standard protocol during bolus administration of intravenous contrast. CONTRAST:  160mL OMNIPAQUE IOHEXOL 350 MG/ML SOLN COMPARISON:  None. FINDINGS: CTA CHEST FINDINGS Cardiovascular: Good opacification of the central and segmental pulmonary arteries. No focal filling defects. No evidence of significant pulmonary embolus. Normal heart size. No pericardial effusions. Coronary artery and aortic calcification. No aortic aneurysm. Mediastinum/Nodes: Small esophageal hiatal hernia. Esophagus is decompressed. No significant lymphadenopathy. Thyroid gland is unremarkable. Lungs/Pleura: Small bilateral pleural effusions with basilar atelectasis. Airspace disease in the right upper lung and right middle lung could represent pneumonia or asymmetrical edema. Musculoskeletal: Old vertebral compression deformity post kyphoplasty. Old rib fractures. No acute bony changes. Review of the MIP images confirms the above findings. CT ABDOMEN and PELVIS FINDINGS Hepatobiliary:  Surgical absence of the gallbladder. No bile duct dilatation. Liver is unremarkable. Pancreas: Fatty infiltration of the pancreas. No acute changes identified. Spleen: Normal in size without focal abnormality. Adrenals/Urinary Tract: Adrenal glands are unremarkable. Kidneys are normal, without renal calculi, focal lesion, or hydronephrosis. Bladder is unremarkable. Stomach/Bowel: Stomach, small bowel, and colon are mostly decompressed. Colonic diverticulosis without evidence of diverticulitis. Appendix is not identified. Vascular/Lymphatic: Aortic atherosclerosis. No enlarged abdominal or pelvic lymph nodes. Reproductive: Uterus is surgically absent. There is a heterogeneous circumscribed presacral mass measuring 3.8 x 3.9 cm in diameter. The mass is partially fat density. This could represent an ovarian lesion in an abnormal location, an old hematoma or area of fat necrosis, or a mass associated with the sacrum. Other: No free air or free fluid in the abdomen. Musculoskeletal: Lumbar scoliosis convex towards the left. Degenerative changes in the spine and hips. No destructive bone lesions. Review of the MIP images confirms the above findings. IMPRESSION: 1. No evidence of significant pulmonary embolus. 2. Small bilateral pleural effusions with basilar atelectasis. 3. Airspace disease in the right upper lung and right middle lung could represent pneumonia or asymmetrical edema. 4. Small esophageal hiatal hernia. 5. Colonic diverticulosis without evidence of diverticulitis. 6. 3.8 cm heterogeneous circumscribed presacral mass with fat density. This could represent an ovarian lesion, old hematoma or area of fat necrosis, or a mass associated with the sacrum. Aortic atherosclerosis. Aortic Atherosclerosis (ICD10-I70.0). Electronically Signed   By: Lucienne Capers M.D.   On: 03/14/2020 03:29   CT ABDOMEN PELVIS W CONTRAST  Result Date: 03/14/2020 CLINICAL DATA:  Altered mental status. Vomiting. Distended stomach.  High probability for pulmonary embolus. EXAM: CT ANGIOGRAPHY CHEST CT ABDOMEN AND PELVIS WITH CONTRAST TECHNIQUE: Multidetector CT imaging of the chest was performed using the standard protocol during bolus administration of intravenous contrast. Multiplanar CT image reconstructions and MIPs were obtained to evaluate the vascular anatomy. Multidetector CT imaging of the abdomen and pelvis was performed using the standard protocol during bolus administration of intravenous contrast. CONTRAST:  168mL OMNIPAQUE IOHEXOL 350 MG/ML SOLN COMPARISON:  None. FINDINGS: CTA CHEST FINDINGS Cardiovascular: Good opacification of the central and segmental pulmonary arteries. No focal filling defects. No evidence of significant pulmonary embolus. Normal heart size. No pericardial effusions. Coronary artery and aortic calcification. No aortic aneurysm. Mediastinum/Nodes: Small esophageal hiatal hernia. Esophagus  is decompressed. No significant lymphadenopathy. Thyroid gland is unremarkable. Lungs/Pleura: Small bilateral pleural effusions with basilar atelectasis. Airspace disease in the right upper lung and right middle lung could represent pneumonia or asymmetrical edema. Musculoskeletal: Old vertebral compression deformity post kyphoplasty. Old rib fractures. No acute bony changes. Review of the MIP images confirms the above findings. CT ABDOMEN and PELVIS FINDINGS Hepatobiliary: Surgical absence of the gallbladder. No bile duct dilatation. Liver is unremarkable. Pancreas: Fatty infiltration of the pancreas. No acute changes identified. Spleen: Normal in size without focal abnormality. Adrenals/Urinary Tract: Adrenal glands are unremarkable. Kidneys are normal, without renal calculi, focal lesion, or hydronephrosis. Bladder is unremarkable. Stomach/Bowel: Stomach, small bowel, and colon are mostly decompressed. Colonic diverticulosis without evidence of diverticulitis. Appendix is not identified. Vascular/Lymphatic: Aortic  atherosclerosis. No enlarged abdominal or pelvic lymph nodes. Reproductive: Uterus is surgically absent. There is a heterogeneous circumscribed presacral mass measuring 3.8 x 3.9 cm in diameter. The mass is partially fat density. This could represent an ovarian lesion in an abnormal location, an old hematoma or area of fat necrosis, or a mass associated with the sacrum. Other: No free air or free fluid in the abdomen. Musculoskeletal: Lumbar scoliosis convex towards the left. Degenerative changes in the spine and hips. No destructive bone lesions. Review of the MIP images confirms the above findings. IMPRESSION: 1. No evidence of significant pulmonary embolus. 2. Small bilateral pleural effusions with basilar atelectasis. 3. Airspace disease in the right upper lung and right middle lung could represent pneumonia or asymmetrical edema. 4. Small esophageal hiatal hernia. 5. Colonic diverticulosis without evidence of diverticulitis. 6. 3.8 cm heterogeneous circumscribed presacral mass with fat density. This could represent an ovarian lesion, old hematoma or area of fat necrosis, or a mass associated with the sacrum. Aortic atherosclerosis. Aortic Atherosclerosis (ICD10-I70.0). Electronically Signed   By: Lucienne Capers M.D.   On: 03/14/2020 03:29    ____________________________________________   PROCEDURES  Procedure(s) performed:yes .1-3 Lead EKG Interpretation Performed by: Rudene Re, MD Authorized by: Rudene Re, MD     Interpretation: non-specific     ECG rate assessment: normal     Rhythm: atrial fibrillation     Ectopy: none     Critical Care performed: yes  CRITICAL CARE Performed by: Rudene Re  ?  Total critical care time: 40 min  Critical care time was exclusive of separately billable procedures and treating other patients.  Critical care was necessary to treat or prevent imminent or life-threatening deterioration.  Critical care was time spent  personally by me on the following activities: development of treatment plan with patient and/or surrogate as well as nursing, discussions with consultants, evaluation of patient's response to treatment, examination of patient, obtaining history from patient or surrogate, ordering and performing treatments and interventions, ordering and review of laboratory studies, ordering and review of radiographic studies, pulse oximetry and re-evaluation of patient's condition.  ____________________________________________   INITIAL IMPRESSION / ASSESSMENT AND PLAN / ED COURSE   86 y.o. female with a history of provoked DVT/PE and not on anticoagulation, hypertension, hyperlipidemia, A. fib who was brought in by her daughter for altered mental status.  Patient with progressively worsening altered mental status over the last several weeks but today had vomited and patient's daughter was concerned for possible aspiration event since patient was found to be hypoxic per EMS.  She has slightly increased work of breathing with tachypnea, hypoxia on room air which improved on 2 L nasal cannula the lungs are clear to auscultation.  With a history of a provoked DVT/PE in the past and a recent surgery, will get a CTA of the chest to rule out PE.  Chest x-ray concerning for right infiltrate possibly from aspiration.  CT will allow [place] for better visualization.  Patient has not been vaccinated against Covid.  Will test for Covid.  Head CT negative for any acute pathology, confirmed by radiology.  Patient has no focal neuro deficits. EKG showing A. fib with well-controlled rate, no signs of ischemia.   UA negative for UTI.  Patient does have an elevated white count of 12 but no signs of sepsis with no fever, tachycardia, tachypnea, no lactic acidosis, negative procalcitonin.  Daughter is concerned the patient's abdomen is distended.  She does have diffuse tenderness to palpation.  Therefore we will also get a CT abdomen pelvis.  Her  Covid test here is negative.  Blood cultures were sent.  Patient has no tenderness on palpation of the spine and surgical site looks well-healed with no signs of infection.  History gathered from patient's daughter was at bedside.  Plan discussed with her.  Old medical records reviewed.     _________________________ 3:35 AM on 03/14/2020 -----------------------------------------  CT of the abdomen pelvis negative.  CT of the chest showing pneumonia but no signs of PE.  Since patient is 17 days postop and also has concern for aspiration pneumonia will cover broadly with cefepime, Vanco and Flagyl.  Since patient has new oxygen requirement and delirium in the setting of infection will admit to the hospitalist service.  _____________________________________________ Please note:  Patient was evaluated in Emergency Department today for the symptoms described in the history of present illness. Patient was evaluated in the context of the global COVID-19 pandemic, which necessitated consideration that the patient might be at risk for infection with the SARS-CoV-2 virus that causes COVID-19. Institutional protocols and algorithms that pertain to the evaluation of patients at risk for COVID-19 are in a state of rapid change based on information released by regulatory bodies including the CDC and federal and state organizations. These policies and algorithms were followed during the patient's care in the ED.  Some ED evaluations and interventions may be delayed as a result of limited staffing during the pandemic.   [place] Controlled Substance Database was reviewed by me. ____________________________________________   FINAL CLINICAL IMPRESSION(S) / ED DIAGNOSES   Final diagnoses:  Encephalopathy acute  Acute respiratory failure with hypoxia (HCC)  Aspiration pneumonia of right lung due to gastric secretions, unspecified part of lung ([place])      NEW MEDICATIONS STARTED DURING THIS VISIT:  ED Discharge  Orders    None       Note:  This document was prepared using Dragon voice recognition software and may include unintentional dictation errors.    Alfred Levins, [place], MD 03/14/20 [phone]

## 2020-03-14 NOTE — ED Notes (Signed)
Lab notified of need for venipuncture assist with am labs.

## 2020-03-14 NOTE — Progress Notes (Signed)
Pharmacy Antibiotic Note  Mary Rice is a 86 y.o. female admitted on 03/14/2020 with pneumonia.  Pharmacy has been consulted for Vancomycin dosing.  Plan: Vancomycin 1gm x 1 then 750mg  IV q12hrs (per nomogram dosing) Check Vancomycin levels as indicated  Height: 5\' 8"  (172.7 cm) Weight: 90.7 kg (199 lb 15.3 oz) IBW/kg (Calculated) : 63.9  Temp (24hrs), Avg:98.9 F (37.2 C), Min:98.9 F (37.2 C), Max:98.9 F (37.2 C)  Recent Labs  Lab 03/13/20 1955 03/13/20 2155  WBC 12.0*  --   CREATININE 0.72  --   LATICACIDVEN  --  1.5    Estimated Creatinine Clearance: 60.5 mL/min (by C-G formula based on SCr of 0.72 mg/dL).    Allergies  Allergen Reactions  . Biaxin [Clarithromycin]     Reaction unknown  . Maxzide [Hydrochlorothiazide W-Triamterene]     Reaction unknown  . Penicillins Rash  . Sulfa Antibiotics Rash    Antimicrobials this admission:   >>    >>   Dose adjustments this admission:   Microbiology results:  BCx:   UCx:    Sputum:    MRSA PCR:   Thank you for allowing pharmacy to be a part of this patient's care.  Hart Robinsons A 03/14/2020 6:19 AM

## 2020-03-14 NOTE — ED Notes (Signed)
Report to jennifer, rn.  

## 2020-03-14 NOTE — H&P (Addendum)
History and Physical    Mary Rice DOB: August 21, 1932 DOA: 03/14/2020  PCP: Rusty Aus, MD   Patient coming from: Home  Chief Complaint:  Altered mental status, vomitting.  HPI: Mary Rice is a 86 y.o. female with medical history significant for history of provoked DVT/PE and not on anticoagulation, hypertension, hyperlipidemia, A. fib who presents by EMS with altered mental status.  According to the daughter, Mary Rice has had progressively worsening altered mental status for several weeks. She had a fall which resulted in thoracic compression fracture for which she underwent kyphoplasty 17 days ago by Dr. Rudene Christians.  Daughter went to her house and found her mother across the bed surrounded by vomit. When she aroused her mother, Mary Rice was confused and combative toward her daughter which is not typical for her. Daughter called 911 and when EMS arrived patient was satting in the low 80s.  She was placed on 2 L of oxygen.  Patient has had no cough or fever. According to the daughter, patient had one DVT and PE after knee surgery several years ago.  Since the fall a few months ago she was taken off eliquis at that time with no plan to ever resume anticoagulation. Mary Rice had Covid-19 infection last year.   ED Course: In the emergency room Ms. Severe is found to be hypoxic and has right upper and right middle lobe pneumonia on chest x-ray and CT scan of her chest.  Covid swab is negative today.  Patient did receive antibiotic she had the procedure of kyphoplasty 2 and half weeks ago according to the daughter.  Is unclear patient had an aspiration event versus HCAP as cause of her delirium condition.  Patient was covered empirically for both conditions emergency room.  Review of Systems:  Review of system cannot be obtained from patient secondary to delirium and acute medical condition   Past Medical History:  Diagnosis Date  . DVT (deep venous thrombosis) ([place])   . Dysrhythmia     . Hiatal hernia   . Hyperlipemia   . Hypertension   . Pulmonary embolism [facility])     Past Surgical History:  Procedure Laterality Date  . ABDOMINAL HYSTERECTOMY    . CATARACT EXTRACTION    . CHOLECYSTECTOMY    . ESOPHAGOGASTRODUODENOSCOPY N/A 08/19/2015   Procedure: ESOPHAGOGASTRODUODENOSCOPY (EGD);  Surgeon: Mauri Pole, MD;  Location: [facility] ENDOSCOPY;  Service: Endoscopy;  Laterality: N/A;  . KYPHOPLASTY N/A 02/26/2020   Procedure: KYPHOPLASTY T11;  Surgeon: Hessie Knows, MD;  Location: [facility] ORS;  Service: Orthopedics;  Laterality: N/A;    Social History  reports that she has never smoked. She has never used smokeless tobacco. She reports that she does not drink alcohol and does not use drugs.  Allergies  Allergen Reactions  . Biaxin [Clarithromycin]     Reaction unknown  . Maxzide [Hydrochlorothiazide W-Triamterene]     Reaction unknown  . Penicillins Rash  . Sulfa Antibiotics Rash    Family History  Problem Relation Age of Onset  . Heart failure Mother      Prior to Admission medications   Medication Sig Start Date End Date Taking? Authorizing Provider  acetaminophen (TYLENOL) 650 MG CR tablet Take 1,300 mg by mouth every 8 (eight) hours as needed for pain.   Yes [provider]  Ascorbic Acid (VITAMIN C) 1000 MG tablet Take 1,000 mg by mouth daily.   Yes [provider]  aspirin EC 325 MG tablet Take 325  mg by mouth daily.   Yes [provider]  atenolol (TENORMIN) 25 MG tablet Take 25 mg by mouth daily. 08/09/15  Yes [provider]  Calcium Carb-Cholecalciferol (CALCIUM 600 + D PO) Take 1 tablet by mouth daily.   Yes [provider]  calcium carbonate (TUMS - DOSED IN MG ELEMENTAL CALCIUM) 500 MG chewable tablet Chew 1 tablet by mouth daily as needed for indigestion or heartburn.   Yes [provider]  Cholecalciferol 50 MCG (2000 UT) TABS Take 2,000 Units by mouth daily.   Yes [provider]   docusate sodium (COLACE) 100 MG capsule Take 200 mg by mouth at bedtime.   Yes [provider]  etodolac (LODINE) 400 MG tablet Take 400 mg by mouth daily.   Yes [provider]  Multiple Vitamin (MULTIVITAMIN WITH MINERALS) TABS tablet Take 1 tablet by mouth daily.   Yes [provider]  pantoprazole (PROTONIX) 40 MG tablet Take 40 mg by mouth daily.   Yes [provider]  polyethylene glycol (MIRALAX / GLYCOLAX) 17 g packet Take 17 g by mouth daily.   Yes [provider]  pravastatin (PRAVACHOL) 20 MG tablet Take 20 mg by mouth at bedtime.    Yes [provider]  sodium chloride 1 g tablet Take 1 g by mouth daily.   Yes [provider]  traMADol (ULTRAM) 50 MG tablet Take 1 tablet (50 mg total) by mouth daily as needed for moderate pain. Patient taking differently: Take 25 mg by mouth 3 (three) times daily.  02/26/20  Yes Hessie Knows, MD  ELIQUIS 5 MG TABS tablet Take 5 mg by mouth 2 (two) times daily. Patient not taking: Reported on 03/14/2020 03/03/20   [provider]  PARoxetine (PAXIL) 20 MG tablet Take 20 mg by mouth daily. Patient not taking: Reported on 03/14/2020    [provider]    Physical Exam: Vitals:   03/13/20 2000 03/14/20 0145 03/14/20 0238 03/14/20 0358  BP: 120/65 (!) 139/53 (!) 136/58 (!) 136/58  Pulse: 90 71 65 (!) 53  Resp: (!) 24 20 16 17   Temp: 98.9 F (37.2 C) 98.9 F (37.2 C)    TempSrc:  Oral    SpO2: 94% 98% 95% 95%  Weight: 90.7 kg     Height: 5\' 8"  (1.727 m)       Constitutional: NAD, calm, comfortable Vitals:   03/13/20 2000 03/14/20 0145 03/14/20 0238 03/14/20 0358  BP: 120/65 (!) 139/53 (!) 136/58 (!) 136/58  Pulse: 90 71 65 (!) 53  Resp: (!) 24 20 16 17   Temp: 98.9 F (37.2 C) 98.9 F (37.2 C)    TempSrc:  Oral    SpO2: 94% 98% 95% 95%  Weight: 90.7 kg     Height: 5\' 8"  (1.727 m)      General: WDWN.  Sleeping and will briefly open eyes to tactile stimulation  but does not verbally respond and goes back to sleep Eyes: PERRL, lids and conjunctivae normal.  Sclera nonicteric HENT:  [place]/AT, external ears normal.  Nares patent without epistasis.  Mucous membranes are moist. Posterior pharynx clear of any exudate or lesions Neck: Soft, normal range of motion, supple, no masses, Trachea midline Respiratory: Diminished breath sounds with rhonchi in the right upper and middle region.  No wheezing, no crackles. Normal respiratory effort. No accessory muscle use.  Cardiovascular: Irregularly irregular rhythm with normal rate. 2/6 systolic murmur.  No rubs / gallops. No extremity edema. 1+ pedal  pulses. Abdomen: Soft,  nondistended, no rebound or guarding.  No masses palpated. Bowel sounds normoactive Musculoskeletal:  No clubbing / cyanosis. No joint deformity upper and lower extremities. Normal muscle tone.  Skin: Warm, dry, intact no rashes, lesions, ulcers. No induration Neurologic: patella DTR +1 bilaterally.  Moves extremities spontaneously    Labs on Admission: I have personally reviewed following labs and imaging studies  CBC: Recent Labs  Lab 03/13/20 1955  WBC 12.0*  NEUTROABS 9.9*  HGB 14.5  HCT 43.4  MCV 88.8  PLT 481    Basic Metabolic Panel: Recent Labs  Lab 03/13/20 1955  NA 135  K 3.8  CL 97*  CO2 26  GLUCOSE 131*  BUN 19  CREATININE 0.72  CALCIUM 9.2    GFR: Estimated Creatinine Clearance: 60.5 mL/min (by C-G formula based on SCr of 0.72 mg/dL).  Liver Function Tests: Recent Labs  Lab 03/13/20 1955  AST 24  ALT 22  ALKPHOS 82  BILITOT 1.2  PROT 6.6  ALBUMIN 3.8    Urine analysis:    Component Value Date/Time   COLORURINE YELLOW (A) 03/14/2020 0232   APPEARANCEUR CLOUDY (A) 03/14/2020 0232   LABSPEC 1.023 03/14/2020 0232   PHURINE 5.0 03/14/2020 0232   GLUCOSEU NEGATIVE 03/14/2020 0232   HGBUR NEGATIVE 03/14/2020 0232   BILIRUBINUR NEGATIVE 03/14/2020 0232   KETONESUR 20 (A) 03/14/2020 0232   PROTEINUR  100 (A) 03/14/2020 0232   NITRITE NEGATIVE 03/14/2020 0232   LEUKOCYTESUR NEGATIVE 03/14/2020 0232    Radiological Exams on Admission: DG Chest 1 View  Result Date: 03/13/2020 CLINICAL DATA:  Altered mental status EXAM: CHEST  1 VIEW COMPARISON:  08/21/2016 FINDINGS: There is focal pulmonary infiltrate noted within the right upper lobe, likely infectious in the appropriate clinical setting. No pneumothorax or pleural effusion. Cardiac size within normal limits. Pulmonary vascularity is normal. No acute bone abnormality. IMPRESSION: Focal pulmonary infiltrate in the right upper lobe, likely infectious in the appropriate clinical setting. Electronically Signed   By: Fidela Salisbury MD   On: 03/13/2020 21:10   DG Abdomen 1 View  Result Date: 03/13/2020 CLINICAL DATA:  Ileus EXAM: ABDOMEN - 1 VIEW COMPARISON:  None. FINDINGS: Normal abdominal gas pattern. Moderate stool within the rectal vault. No gross free intraperitoneal gas. Cholecystectomy clips noted within the right upper quadrant. T11 vertebroplasty has been performed. Moderate lumbar levoscoliosis. No acute bone abnormality. IMPRESSION: Moderate stool within the rectal vault. No evidence of bowel obstruction or free intraperitoneal gas. Electronically Signed   By: Fidela Salisbury MD   On: 03/13/2020 21:12   CT Head Wo Contrast  Result Date: 03/13/2020 CLINICAL DATA:  Altered mental status EXAM: CT HEAD WITHOUT CONTRAST TECHNIQUE: Contiguous axial images were obtained from the base of the skull through the vertex without intravenous contrast. COMPARISON:  None. FINDINGS: Brain: There is atrophy and chronic small vessel disease changes. No acute intracranial abnormality. Specifically, no hemorrhage, hydrocephalus, mass lesion, acute infarction, or significant intracranial injury. Vascular: No hyperdense vessel or unexpected calcification. Skull: No acute calvarial abnormality. Sinuses/Orbits: Visualized paranasal sinuses and mastoids clear. Orbital  soft tissues unremarkable. Other: None IMPRESSION: Atrophy, chronic microvascular disease. No acute intracranial abnormality. Electronically Signed   By: Rolm Baptise M.D.   On: 03/13/2020 21:14   CT Angio Chest PE W and/or Wo Contrast  Result Date: 03/14/2020 CLINICAL DATA:  Altered mental status. Vomiting. Distended stomach. High probability for pulmonary embolus. EXAM: CT ANGIOGRAPHY CHEST CT ABDOMEN AND PELVIS WITH CONTRAST TECHNIQUE: Multidetector CT  imaging of the chest was performed using the standard protocol during bolus administration of intravenous contrast. Multiplanar CT image reconstructions and MIPs were obtained to evaluate the vascular anatomy. Multidetector CT imaging of the abdomen and pelvis was performed using the standard protocol during bolus administration of intravenous contrast. CONTRAST:  193mL OMNIPAQUE IOHEXOL 350 MG/ML SOLN COMPARISON:  None. FINDINGS: CTA CHEST FINDINGS Cardiovascular: Good opacification of the central and segmental pulmonary arteries. No focal filling defects. No evidence of significant pulmonary embolus. Normal heart size. No pericardial effusions. Coronary artery and aortic calcification. No aortic aneurysm. Mediastinum/Nodes: Small esophageal hiatal hernia. Esophagus is decompressed. No significant lymphadenopathy. Thyroid gland is unremarkable. Lungs/Pleura: Small bilateral pleural effusions with basilar atelectasis. Airspace disease in the right upper lung and right middle lung could represent pneumonia or asymmetrical edema. Musculoskeletal: Old vertebral compression deformity post kyphoplasty. Old rib fractures. No acute bony changes. Review of the MIP images confirms the above findings. CT ABDOMEN and PELVIS FINDINGS Hepatobiliary: Surgical absence of the gallbladder. No bile duct dilatation. Liver is unremarkable. Pancreas: Fatty infiltration of the pancreas. No acute changes identified. Spleen: Normal in size without focal abnormality. Adrenals/Urinary  Tract: Adrenal glands are unremarkable. Kidneys are normal, without renal calculi, focal lesion, or hydronephrosis. Bladder is unremarkable. Stomach/Bowel: Stomach, small bowel, and colon are mostly decompressed. Colonic diverticulosis without evidence of diverticulitis. Appendix is not identified. Vascular/Lymphatic: Aortic atherosclerosis. No enlarged abdominal or pelvic lymph nodes. Reproductive: Uterus is surgically absent. There is a heterogeneous circumscribed presacral mass measuring 3.8 x 3.9 cm in diameter. The mass is partially fat density. This could represent an ovarian lesion in an abnormal location, an old hematoma or area of fat necrosis, or a mass associated with the sacrum. Other: No free air or free fluid in the abdomen. Musculoskeletal: Lumbar scoliosis convex towards the left. Degenerative changes in the spine and hips. No destructive bone lesions. Review of the MIP images confirms the above findings. IMPRESSION: 1. No evidence of significant pulmonary embolus. 2. Small bilateral pleural effusions with basilar atelectasis. 3. Airspace disease in the right upper lung and right middle lung could represent pneumonia or asymmetrical edema. 4. Small esophageal hiatal hernia. 5. Colonic diverticulosis without evidence of diverticulitis. 6. 3.8 cm heterogeneous circumscribed presacral mass with fat density. This could represent an ovarian lesion, old hematoma or area of fat necrosis, or a mass associated with the sacrum. Aortic atherosclerosis. Aortic Atherosclerosis (ICD10-I70.0). Electronically Signed   By: Lucienne Capers M.D.   On: 03/14/2020 03:29   CT ABDOMEN PELVIS W CONTRAST  Result Date: 03/14/2020 CLINICAL DATA:  Altered mental status. Vomiting. Distended stomach. High probability for pulmonary embolus. EXAM: CT ANGIOGRAPHY CHEST CT ABDOMEN AND PELVIS WITH CONTRAST TECHNIQUE: Multidetector CT imaging of the chest was performed using the standard protocol during bolus administration of  intravenous contrast. Multiplanar CT image reconstructions and MIPs were obtained to evaluate the vascular anatomy. Multidetector CT imaging of the abdomen and pelvis was performed using the standard protocol during bolus administration of intravenous contrast. CONTRAST:  122mL OMNIPAQUE IOHEXOL 350 MG/ML SOLN COMPARISON:  None. FINDINGS: CTA CHEST FINDINGS Cardiovascular: Good opacification of the central and segmental pulmonary arteries. No focal filling defects. No evidence of significant pulmonary embolus. Normal heart size. No pericardial effusions. Coronary artery and aortic calcification. No aortic aneurysm. Mediastinum/Nodes: Small esophageal hiatal hernia. Esophagus is decompressed. No significant lymphadenopathy. Thyroid gland is unremarkable. Lungs/Pleura: Small bilateral pleural effusions with basilar atelectasis. Airspace disease in the right upper lung and right middle lung could represent  pneumonia or asymmetrical edema. Musculoskeletal: Old vertebral compression deformity post kyphoplasty. Old rib fractures. No acute bony changes. Review of the MIP images confirms the above findings. CT ABDOMEN and PELVIS FINDINGS Hepatobiliary: Surgical absence of the gallbladder. No bile duct dilatation. Liver is unremarkable. Pancreas: Fatty infiltration of the pancreas. No acute changes identified. Spleen: Normal in size without focal abnormality. Adrenals/Urinary Tract: Adrenal glands are unremarkable. Kidneys are normal, without renal calculi, focal lesion, or hydronephrosis. Bladder is unremarkable. Stomach/Bowel: Stomach, small bowel, and colon are mostly decompressed. Colonic diverticulosis without evidence of diverticulitis. Appendix is not identified. Vascular/Lymphatic: Aortic atherosclerosis. No enlarged abdominal or pelvic lymph nodes. Reproductive: Uterus is surgically absent. There is a heterogeneous circumscribed presacral mass measuring 3.8 x 3.9 cm in diameter. The mass is partially fat density.  This could represent an ovarian lesion in an abnormal location, an old hematoma or area of fat necrosis, or a mass associated with the sacrum. Other: No free air or free fluid in the abdomen. Musculoskeletal: Lumbar scoliosis convex towards the left. Degenerative changes in the spine and hips. No destructive bone lesions. Review of the MIP images confirms the above findings. IMPRESSION: 1. No evidence of significant pulmonary embolus. 2. Small bilateral pleural effusions with basilar atelectasis. 3. Airspace disease in the right upper lung and right middle lung could represent pneumonia or asymmetrical edema. 4. Small esophageal hiatal hernia. 5. Colonic diverticulosis without evidence of diverticulitis. 6. 3.8 cm heterogeneous circumscribed presacral mass with fat density. This could represent an ovarian lesion, old hematoma or area of fat necrosis, or a mass associated with the sacrum. Aortic atherosclerosis. Aortic Atherosclerosis (ICD10-I70.0). Electronically Signed   By: Lucienne Capers M.D.   On: 03/14/2020 03:29    EKG: Independently reviewed..  Atrial fibrillation with rate control.  No acute ST elevation or depression.  QTc is 418  Assessment/Plan Principal Problem:   Pneumonia involving right lung Mary Rice is admitted to medical/surgical floor.  She was found at home in bed in a pool vomit.  She has right-sided infiltrates on chest x-ray and CT of her chest.  Is unclear if she had aspiration and has no one was with her when she vomited according to the daughter who found her. She is placed on empiric coverage of antibiotic with cefepime, vancomycin and Flagyl to cover for both HCAP and aspiration pneumonia as she had an unwitnessed vomiting episode and had a kyphoplasty a few weeks ago and was given antibiotic at that time for the procedure. Pulmonary toilet.  Incentive spirometer every 2 hours while awake. Oxygen supplementation as needed to keep O2 sat between 92 to 96%. We will consult  speech therapy for swallow evaluation to determine aspiration risk  Active Problems:   Hypoxia Supplemental oxygen as above.  RT to follow and incentive spirometer while awake.    Atrial fibrillation [facility]) Patient is chronic atrial fibrillation which is stable and rate controlled.  Daughter states that she was taken off of Eliquis a few months ago secondary to falls and she reports an July she had a fall and hit the back of her head at that time anticoagulation with Eliquis was stopped and she was placed on full dose aspirin.  Daughter does not want her back on full anticoagulation due to risk of falls    Essential hypertension  Home medications will be verified and reconciled in the morning and resumed.  Monitor blood pressure    Delirium Patient with delirium when daughter found her.  She states after  she arrived to the hospital she was more alert and oriented to her daughter and surroundings.  She is currently asleep.  Will reevaluate delirium in the morning when awake    Nausea and vomiting Antiemetics provided as needed.   DVT prophylaxis: Padua score elevated.  Lovenox for DVT prophylaxis Code Status:   Full code Family Communication:  Diagnosis and plan discussed with patient's daughters at bedside.  She verbalized understanding and agrees with plan.  Questions were answered.  Further agrees to follow as clinical indicated Disposition Plan:   Patient is from:  Home  Anticipated DC to:  Home  Anticipated DC date:  Anticipate greater than 2 midnight stay in the hospital to treat acute medical condition  Anticipated DC barriers: No barriers to discharge identified at this time   Admission status:  Inpatient  Severity of Illness: The appropriate patient status for this patient is INPATIENT. Inpatient status is judged to be reasonable and necessary in order to provide the required intensity of service to ensure the patient's safety. The patient's presenting symptoms, physical exam  findings, and initial radiographic and laboratory data in the context of their chronic comorbidities is felt to place them at high risk for further clinical deterioration. Furthermore, it is not anticipated that the patient will be medically stable for discharge from the hospital within 2 midnights of admission. The following factors support the patient status of inpatient.    * I certify that at the point of admission it is my clinical judgment that the patient will require inpatient hospital care spanning beyond 2 midnights from the point of admission due to high intensity of service, high risk for further deterioration and high frequency of surveillance required.Yevonne Aline Daisey Caloca MD [facility]  How to contact the [facility] Attending or Consulting provider [place] or covering provider during after hours [place], for this patient?   1. Check the care team in [facility] and look for a) attending/consulting [facility] provider listed and b) the [facility] team listed 2. Log into [email or web address] and use [place]'s universal password to access. If you do not have the password, please contact the hospital operator. 3. Locate the [facility] provider you are looking for under [facility] and page to a number that you can be directly reached. 4. If you still have difficulty reaching the provider, please page the [facility] (Director on Call) for the Hospitalists listed on amion for assistance.  03/14/2020, 4:35 AM

## 2020-03-14 NOTE — Progress Notes (Signed)
PROGRESS NOTE    Patient: Mary Rice                            PCP: Rusty Aus, MD                    DOB: [birth year]            DOA: 03/14/2020 JME:[id number]             DOS: 03/14/2020, 7:11 AM   LOS: 0 days   Date of Service: The patient was seen and examined on 03/14/2020  Subjective:   The patient was seen and examined this Am. Stable, satting 97% on 2 L (not O2 dependent at baseline) Still complaining of: cough  Otherwise no issues overnight .  Daughter present at bedside-updated requesting Eliquis to be discontinued at discharge CT angiogram negative for PE was reported  Brief Narrative:   HPI: Mary Rice is a 86 y.o. f w PMH/oprovoked DVT/PE and not on anticoagulation,HTN/ HLD, A. fib who presents by EMS with altered mental status.  According to the daughter, [name] has had progressively worsening altered mental status for several weeks.She had a fall which resulted in thoracic compression fracture for which she underwent kyphoplasty 17 days ago by Dr. Rudene Christians.  According to the daughter, patient had one DVT and PE after knee surgery several years ago. Since the fall a few months ago she was taken off eliquis at that time with no plan to ever resume anticoagulation.   [name] had Covid-19 infection last year.   ED Course: She was found hypoxic and has right upper and right middle lobe pneumonia on chest x-ray and CT scan of her chest.  COVID swab is negative.   Patient did receive antibiotic she had the procedure of kyphoplasty 2 and half weeks ago according to the daughter.    Assessment & Plan:   Principal Problem:   Pneumonia involving right lung Active Problems:   Atrial fibrillation (HCC)   Essential hypertension   Hypoxia   Nausea and vomiting   Delirium    Pneumonia involving right lung, ?  Aspiration versus HCAP -Sepsis ruled out - (mildly bradycardic, respiratory rate 23, blood pressure 136/57, admission 98/52, no endorgan damage) -CTA  negative for pulmonary embolism, chronic changes  -She has right-sided infiltrates on chest x-ray and CT of her chest.  - She is placed on empiric coverage of antibiotic with cefepime, vancomycin and Flagyl to cover - Pulmonary toilet.  - Incentive spirometer every 2 hours while awake. -Oxygen supplementation as needed to keep O2 sat between 92 to 96%. Currently satting 97% on 2 L (not O2 dependent at home) - We will consult speech therapy for swallow evaluation to determine aspiration risk  Active Problems:   Acute respiratory distress - hypoxia -Improving -satting 98% on 2 L of oxygen -Continue O2 supplements -maintain O2 sat greater 90% -DuoNeb bronchodilators -CT angiogram reviewed, negative for pulmonary embolism -Supplemental oxygen as above.  RT to follow and incentive spirometer while awake.    Atrial fibrillation [facility]) Patient is chronic atrial fibrillation which is stable and rate controlled.  Daughter states that she was taken off of Eliquis a few months ago secondary to falls and she reports an July she had a fall and hit the back of her head at that time anticoagulation with Eliquis was stopped and she was placed on full dose aspirin.  Daughter  does not want her back on full anticoagulation due to risk of falls    Essential hypertension  Home medications will be verified and reconciled in the morning and resumed.  Monitor blood pressure    Delirium Patient with delirium when daughter found her.  She states after she arrived to the hospital she was more alert and oriented to her daughter and surroundings.  She is currently asleep.  Will reevaluate delirium in the morning when awake    Nausea and vomiting  Antiemetics provided as needed. -Monitoring closely   Dehydration -Continue gentle IV fluid hydration   History of A. fib, PE, DVT -Patient has been on Eliquis, recently has been taken off Eliquis by PCP due to fall The family and patient requesting Eliquis to  be DC'd upon discharge if not needed CT of chest is negative for pulmonary embolism now -Family patient willing to continue aspirin    Nutritional status:            Cultures; Blood Cultures x 2 >> Sputum Culture if we are able to obtain sputum >>   Antimicrobials: cefepime, vancomycin and Flagyl   Consultants: None   ----------------------------------------------------------------------------------------------------------------------------------------------   DVT prophylaxis:       On Eliquis-anticipating to be discontinued upon discharge Code Status:              Full code Family Communication:       Discussed with daughter at bedside reporting patient has a good support at home upon discharge will be taken home under care of family and home health Disposition Plan:              Patient is from:                        Home             Anticipated DC to:                   Home             Anticipated DC date:               Anticipate greater than 2 midnight stay in the hospital to treat acute medical condition             Anticipated DC barriers:         No barriers to discharge identified at this time   Admission status:     Inpatient        Procedures:   No admission procedures for hospital encounter.     Antimicrobials:  Anti-infectives (From admission, onward)   Start     Dose/Rate Route Frequency Ordered Stop   03/14/20 1800  vancomycin (VANCOREADY) IVPB 750 mg/150 mL        750 mg 150 mL/hr over 60 Minutes Intravenous Every 12 hours 03/14/20 0618     03/14/20 0600  ceFEPIme (MAXIPIME) 1 g in sodium chloride 0.9 % 100 mL IVPB        1 g 200 mL/hr over 30 Minutes Intravenous Every 8 hours 03/14/20 0504     03/14/20 0515  metroNIDAZOLE (FLAGYL) IVPB 500 mg        500 mg 100 mL/hr over 60 Minutes Intravenous Every 8 hours 03/14/20 0504     03/14/20 0345  ceFEPIme (MAXIPIME) 1 g in sodium chloride 0.9 % 100 mL IVPB        1 g 200 mL/hr over 30  Minutes Intravenous  Once 03/14/20 0334 03/14/20 0620   03/14/20 0345  metroNIDAZOLE (FLAGYL) IVPB 500 mg        500 mg 100 mL/hr over 60 Minutes Intravenous  Once 03/14/20 0334 03/14/20 0521   03/14/20 0345  vancomycin (VANCOCIN) IVPB 1000 mg/200 mL premix        1,000 mg 200 mL/hr over 60 Minutes Intravenous  Once 03/14/20 0334         Medication:  . enoxaparin (LOVENOX) injection  40 mg Subcutaneous Q24H    albuterol   Objective:   Vitals:   03/14/20 0515 03/14/20 0530 03/14/20 0630 03/14/20 0700  BP: (!) 100/42 (!) 150/47 (!) 144/49 (!) 129/57  Pulse: (!) 55 (!) 53  (!) 54  Resp: 19 17 18 18   Temp:      TempSrc:      SpO2: 98% 99%  97%  Weight:      Height:        Intake/Output Summary (Last 24 hours) at 03/14/2020 3825 Last data filed at 03/14/2020 0539 Gross per 24 hour  Intake 1200 ml  Output 200 ml  Net 1000 ml   Filed Weights   03/13/20 2000  Weight: 90.7 kg     Examination:   Physical Exam  Constitution:  Alert, cooperative, no distress,  Appears calm and comfortable  Psychiatric: Normal and stable mood and affect, cognition intact,   HEENT: Normocephalic, PERRL, otherwise with in Normal limits  Chest:Chest symmetric Cardio vascular:  S1/S2, RRR, No murmure, No Rubs or Gallops  pulmonary: Clear to auscultation bilaterally, respirations unlabored, negative wheezes / crackles Abdomen: Soft, non-tender, non-distended, bowel sounds,no masses, no organomegaly Muscular skeletal: Moderate generalized weaknesses  Limited exam - in bed, able to move all 4 extremities, Normal strength,  Neuro: CNII-XII intact. , normal motor and sensation, reflexes intact  Extremities: No pitting edema lower extremities, +2 pulses  Skin: Dry, warm to touch, negative for any Rashes, No open wounds Wounds: per nursing documentation    ------------------------------------------------------------------------------------------------------------------------------------------     LABs:  CBC Latest Ref Rng & Units 03/13/2020 04/01/2019 08/21/2016  WBC 4.0 - 10.5 K/uL 12.0(H) 9.1 3.1(L)  Hemoglobin 12.0 - 15.0 g/dL 14.5 14.2 12.5  Hematocrit 36 - 46 % 43.4 42.6 36.0  Platelets 150 - 400 K/uL 255 229 140(L)   CMP Latest Ref Rng & Units 03/13/2020 02/26/2020 04/01/2019  Glucose 70 - 99 mg/dL 131(H) 117(H) 115(H)  BUN 8 - 23 mg/dL 19 17 18   Creatinine 0.44 - 1.00 mg/dL 0.72 0.90 1.02(H)  Sodium 135 - 145 mmol/L 135 134(L) 131(L)  Potassium 3.5 - 5.1 mmol/L 3.8 4.3 3.8  Chloride 98 - 111 mmol/L 97(L) 99 92(L)  CO2 22 - 32 mmol/L 26 27 27   Calcium 8.9 - 10.3 mg/dL 9.2 9.1 9.1  Total Protein 6.5 - 8.1 g/dL 6.6 6.3(L) -  Total Bilirubin 0.3 - 1.2 mg/dL 1.2 1.2 -  Alkaline Phos 38 - 126 U/L 82 91 -  AST 15 - 41 U/L 24 12(L) -  ALT 0 - 44 U/L 22 12 -       Micro Results Recent Results (from the past 240 hour(s))  SARS Coronavirus 2 by RT PCR (hospital order, performed in [place] hospital lab) Nasopharyngeal Nasopharyngeal Swab     Status: None   Collection Time: 03/13/20  8:20 PM   Specimen: Nasopharyngeal Swab  Result Value Ref Range Status   SARS Coronavirus 2 NEGATIVE NEGATIVE Final    Comment: (NOTE) SARS-CoV-2 target  nucleic acids are NOT DETECTED.  The SARS-CoV-2 RNA is generally detectable in upper and lower respiratory specimens during the acute phase of infection. The lowest concentration of SARS-CoV-2 viral copies this assay can detect is 250 copies / mL. A negative result does not preclude SARS-CoV-2 infection and should not be used as the sole basis for treatment or other patient management decisions.  A negative result may occur with improper specimen collection / handling, submission of specimen other than nasopharyngeal swab, presence of viral mutation(s) within the areas targeted by this assay, and inadequate number of viral copies (<250 copies / mL). A negative result must be combined with clinical observations, patient history, and  epidemiological information.  Fact Sheet for Patients:   StrictlyIdeas.no  Fact Sheet for Healthcare Providers: BankingDealers.co.za  This test is not yet approved or  cleared by the [place] FDA and has been authorized for detection and/or diagnosis of SARS-CoV-2 by FDA under an Emergency Use Authorization (EUA).  This EUA will remain in effect (meaning this test can be used) for the duration of the COVID-19 declaration under Section 564(b)(1) of the Act, 21 U.S.C. section 360bbb-3(b)(1), unless the authorization is terminated or revoked sooner.  Performed at [facility] [place] [place], [address] [place], [place] [address]     Radiology Reports DG Chest 1 View  Result Date: 03/13/2020 CLINICAL DATA:  Altered mental status EXAM: CHEST  1 VIEW COMPARISON:  08/21/2016 FINDINGS: There is focal pulmonary infiltrate noted within the right upper lobe, likely infectious in the appropriate clinical setting. No pneumothorax or pleural effusion. Cardiac size within normal limits. Pulmonary vascularity is normal. No acute bone abnormality. IMPRESSION: Focal pulmonary infiltrate in the right upper lobe, likely infectious in the appropriate clinical setting. Electronically Signed   By: Fidela Salisbury MD   On: 03/13/2020 21:10   DG Thoracic Spine 2 View  Result Date: 02/26/2020 CLINICAL DATA:  T11 kyphoplasty EXAM: DG C-ARM 1-60 MIN; THORACIC SPINE 2 VIEWS FLUOROSCOPY TIME:  Fluoroscopy Time:  1 minutes 36 seconds Radiation Exposure Index (if provided by the fluoroscopic device): 21.241 mGy Number of Acquired Spot Images: 2 COMPARISON:  MR lumbar spine 02/17/2020 FINDINGS: Interval T11 kyphoplasty with mild residual height loss. Diffuse osseous demineralization. Degenerative disc disease changes T9-T10 and T10-T11. IMPRESSION: T11 kyphoplasty. Electronically Signed   By: Lavonia Dana M.D.   On: 02/26/2020 12:43   DG Abdomen 1 View  Result Date:  03/13/2020 CLINICAL DATA:  Ileus EXAM: ABDOMEN - 1 VIEW COMPARISON:  None. FINDINGS: Normal abdominal gas pattern. Moderate stool within the rectal vault. No gross free intraperitoneal gas. Cholecystectomy clips noted within the right upper quadrant. T11 vertebroplasty has been performed. Moderate lumbar levoscoliosis. No acute bone abnormality. IMPRESSION: Moderate stool within the rectal vault. No evidence of bowel obstruction or free intraperitoneal gas. Electronically Signed   By: Fidela Salisbury MD   On: 03/13/2020 21:12   CT Head Wo Contrast  Result Date: 03/13/2020 CLINICAL DATA:  Altered mental status EXAM: CT HEAD WITHOUT CONTRAST TECHNIQUE: Contiguous axial images were obtained from the base of the skull through the vertex without intravenous contrast. COMPARISON:  None. FINDINGS: Brain: There is atrophy and chronic small vessel disease changes. No acute intracranial abnormality. Specifically, no hemorrhage, hydrocephalus, mass lesion, acute infarction, or significant intracranial injury. Vascular: No hyperdense vessel or unexpected calcification. Skull: No acute calvarial abnormality. Sinuses/Orbits: Visualized paranasal sinuses and mastoids clear. Orbital soft tissues unremarkable. Other: None IMPRESSION: Atrophy, chronic microvascular disease. No acute intracranial abnormality. Electronically  Signed   By: Rolm Baptise M.D.   On: 03/13/2020 21:14   CT Angio Chest PE W and/or Wo Contrast  Result Date: 03/14/2020 CLINICAL DATA:  Altered mental status. Vomiting. Distended stomach. High probability for pulmonary embolus. EXAM: CT ANGIOGRAPHY CHEST CT ABDOMEN AND PELVIS WITH CONTRAST TECHNIQUE: Multidetector CT imaging of the chest was performed using the standard protocol during bolus administration of intravenous contrast. Multiplanar CT image reconstructions and MIPs were obtained to evaluate the vascular anatomy. Multidetector CT imaging of the abdomen and pelvis was performed using the standard  protocol during bolus administration of intravenous contrast. CONTRAST:  156mL OMNIPAQUE IOHEXOL 350 MG/ML SOLN COMPARISON:  None. FINDINGS: CTA CHEST FINDINGS Cardiovascular: Good opacification of the central and segmental pulmonary arteries. No focal filling defects. No evidence of significant pulmonary embolus. Normal heart size. No pericardial effusions. Coronary artery and aortic calcification. No aortic aneurysm. Mediastinum/Nodes: Small esophageal hiatal hernia. Esophagus is decompressed. No significant lymphadenopathy. Thyroid gland is unremarkable. Lungs/Pleura: Small bilateral pleural effusions with basilar atelectasis. Airspace disease in the right upper lung and right middle lung could represent pneumonia or asymmetrical edema. Musculoskeletal: Old vertebral compression deformity post kyphoplasty. Old rib fractures. No acute bony changes. Review of the MIP images confirms the above findings. CT ABDOMEN and PELVIS FINDINGS Hepatobiliary: Surgical absence of the gallbladder. No bile duct dilatation. Liver is unremarkable. Pancreas: Fatty infiltration of the pancreas. No acute changes identified. Spleen: Normal in size without focal abnormality. Adrenals/Urinary Tract: Adrenal glands are unremarkable. Kidneys are normal, without renal calculi, focal lesion, or hydronephrosis. Bladder is unremarkable. Stomach/Bowel: Stomach, small bowel, and colon are mostly decompressed. Colonic diverticulosis without evidence of diverticulitis. Appendix is not identified. Vascular/Lymphatic: Aortic atherosclerosis. No enlarged abdominal or pelvic lymph nodes. Reproductive: Uterus is surgically absent. There is a heterogeneous circumscribed presacral mass measuring 3.8 x 3.9 cm in diameter. The mass is partially fat density. This could represent an ovarian lesion in an abnormal location, an old hematoma or area of fat necrosis, or a mass associated with the sacrum. Other: No free air or free fluid in the abdomen.  Musculoskeletal: Lumbar scoliosis convex towards the left. Degenerative changes in the spine and hips. No destructive bone lesions. Review of the MIP images confirms the above findings. IMPRESSION: 1. No evidence of significant pulmonary embolus. 2. Small bilateral pleural effusions with basilar atelectasis. 3. Airspace disease in the right upper lung and right middle lung could represent pneumonia or asymmetrical edema. 4. Small esophageal hiatal hernia. 5. Colonic diverticulosis without evidence of diverticulitis. 6. 3.8 cm heterogeneous circumscribed presacral mass with fat density. This could represent an ovarian lesion, old hematoma or area of fat necrosis, or a mass associated with the sacrum. Aortic atherosclerosis. Aortic Atherosclerosis (ICD10-I70.0). Electronically Signed   By: Lucienne Capers M.D.   On: 03/14/2020 03:29   MR LUMBAR SPINE WO CONTRAST  Result Date: 02/17/2020 CLINICAL DATA:  Left side low back pain with movement since a fall 3 weeks ago. Initial encounter. EXAM: MRI LUMBAR SPINE WITHOUT CONTRAST TECHNIQUE: Multiplanar, multisequence MR imaging of the lumbar spine was performed. No intravenous contrast was administered. COMPARISON:  MRI thoracic spine 07/08/2019. FINDINGS: Segmentation:  Standard. Alignment: There is severe convex left scoliosis with the apex at L2. No listhesis. Vertebrae: The patient has a biconcave compression fracture of T11 with vertebral body height loss centrally of approximately 50%. There is also a Schmorl's node in the superior endplate of R91 with surrounding marrow edema. The Schmorl's node is new since the prior  MRI. Scattered hemangiomas are noted. Conus medullaris and cauda equina: Conus extends to the L1-2 level. Conus and cauda equina appear normal. Paraspinal and other soft tissues: Negative. Disc levels: T10-11: Mild ligamentum flavum thickening and facet degenerative disease. No disc bulge or protrusion. No stenosis. T11-12: Shallow broad-based left  paracentral protrusion and ligamentum flavum thickening appear unchanged. No bony retropulsion off the inferior endplate. The foramina are open. T12-L1: Negative. L1-2: Shallow disc bulge and endplate spur without stenosis. L2-3: Shallow disc bulge and endplate spur without stenosis. L3-4: Minimal disc bulge and mild facet degenerative disease. No stenosis. L4-5: Shallow disc bulge to the left with some facet degenerative change. There is mild left foraminal narrowing. The central canal and right foramen are open. L5-S1: Negative. IMPRESSION: Acute or subacute biconcave compression fracture of T11 with vertebral body height loss of up to 50%. No bony retropulsion. Schmorl's node in the superior endplate of Q22 with mild surrounding marrow edema is new since the prior MRI. Shallow broad-based central and left paracentral protrusion at T11-12 causes mild central canal narrowing, mildly progressive since the prior exam. Severe convex left lumbar scoliosis. Mild lumbar degenerative disc disease without central canal narrowing. Mild left foraminal narrowing at L4-5 noted. Electronically Signed   By: Inge Rise M.D.   On: 02/17/2020 11:12   CT ABDOMEN PELVIS W CONTRAST  Result Date: 03/14/2020 CLINICAL DATA:  Altered mental status. Vomiting. Distended stomach. High probability for pulmonary embolus. EXAM: CT ANGIOGRAPHY CHEST CT ABDOMEN AND PELVIS WITH CONTRAST TECHNIQUE: Multidetector CT imaging of the chest was performed using the standard protocol during bolus administration of intravenous contrast. Multiplanar CT image reconstructions and MIPs were obtained to evaluate the vascular anatomy. Multidetector CT imaging of the abdomen and pelvis was performed using the standard protocol during bolus administration of intravenous contrast. CONTRAST:  126mL OMNIPAQUE IOHEXOL 350 MG/ML SOLN COMPARISON:  None. FINDINGS: CTA CHEST FINDINGS Cardiovascular: Good opacification of the central and segmental pulmonary  arteries. No focal filling defects. No evidence of significant pulmonary embolus. Normal heart size. No pericardial effusions. Coronary artery and aortic calcification. No aortic aneurysm. Mediastinum/Nodes: Small esophageal hiatal hernia. Esophagus is decompressed. No significant lymphadenopathy. Thyroid gland is unremarkable. Lungs/Pleura: Small bilateral pleural effusions with basilar atelectasis. Airspace disease in the right upper lung and right middle lung could represent pneumonia or asymmetrical edema. Musculoskeletal: Old vertebral compression deformity post kyphoplasty. Old rib fractures. No acute bony changes. Review of the MIP images confirms the above findings. CT ABDOMEN and PELVIS FINDINGS Hepatobiliary: Surgical absence of the gallbladder. No bile duct dilatation. Liver is unremarkable. Pancreas: Fatty infiltration of the pancreas. No acute changes identified. Spleen: Normal in size without focal abnormality. Adrenals/Urinary Tract: Adrenal glands are unremarkable. Kidneys are normal, without renal calculi, focal lesion, or hydronephrosis. Bladder is unremarkable. Stomach/Bowel: Stomach, small bowel, and colon are mostly decompressed. Colonic diverticulosis without evidence of diverticulitis. Appendix is not identified. Vascular/Lymphatic: Aortic atherosclerosis. No enlarged abdominal or pelvic lymph nodes. Reproductive: Uterus is surgically absent. There is a heterogeneous circumscribed presacral mass measuring 3.8 x 3.9 cm in diameter. The mass is partially fat density. This could represent an ovarian lesion in an abnormal location, an old hematoma or area of fat necrosis, or a mass associated with the sacrum. Other: No free air or free fluid in the abdomen. Musculoskeletal: Lumbar scoliosis convex towards the left. Degenerative changes in the spine and hips. No destructive bone lesions. Review of the MIP images confirms the above findings. IMPRESSION: 1. No evidence of significant  pulmonary  embolus. 2. Small bilateral pleural effusions with basilar atelectasis. 3. Airspace disease in the right upper lung and right middle lung could represent pneumonia or asymmetrical edema. 4. Small esophageal hiatal hernia. 5. Colonic diverticulosis without evidence of diverticulitis. 6. 3.8 cm heterogeneous circumscribed presacral mass with fat density. This could represent an ovarian lesion, old hematoma or area of fat necrosis, or a mass associated with the sacrum. Aortic atherosclerosis. Aortic Atherosclerosis (ICD10-I70.0). Electronically Signed   By: Lucienne Capers M.D.   On: 03/14/2020 03:29   DG C-Arm 1-60 Min  Result Date: 02/26/2020 CLINICAL DATA:  T11 kyphoplasty EXAM: DG C-ARM 1-60 MIN; THORACIC SPINE 2 VIEWS FLUOROSCOPY TIME:  Fluoroscopy Time:  1 minutes 36 seconds Radiation Exposure Index (if provided by the fluoroscopic device): 21.241 mGy Number of Acquired Spot Images: 2 COMPARISON:  MR lumbar spine 02/17/2020 FINDINGS: Interval T11 kyphoplasty with mild residual height loss. Diffuse osseous demineralization. Degenerative disc disease changes T9-T10 and T10-T11. IMPRESSION: T11 kyphoplasty. Electronically Signed   By: Lavonia Dana M.D.   On: 02/26/2020 12:43    SIGNED: Deatra James, MD, FACP, FHM. [facility],  Pager (please use [email or web address] to page/text)  If 7PM-7AM, please contact night-coverage [email or web address], Password [facility] 03/14/2020, 7:11 AM

## 2020-03-15 LAB — CBC
HCT: 37.6 % (ref 36.0–46.0)
Hemoglobin: 13 g/dL (ref 12.0–15.0)
MCH: 29.8 pg (ref 26.0–34.0)
MCHC: 34.6 g/dL (ref 30.0–36.0)
MCV: 86.2 fL (ref 80.0–100.0)
Platelets: 217 10*3/uL (ref 150–400)
RBC: 4.36 MIL/uL (ref 3.87–5.11)
RDW: 17.1 % — ABNORMAL HIGH (ref 11.5–15.5)
WBC: 8.2 10*3/uL (ref 4.0–10.5)
nRBC: 0 % (ref 0.0–0.2)

## 2020-03-15 LAB — BASIC METABOLIC PANEL
Anion gap: 8 (ref 5–15)
BUN: 14 mg/dL (ref 8–23)
CO2: 26 mmol/L (ref 22–32)
Calcium: 8.5 mg/dL — ABNORMAL LOW (ref 8.9–10.3)
Chloride: 100 mmol/L (ref 98–111)
Creatinine, Ser: 0.66 mg/dL (ref 0.44–1.00)
GFR calc Af Amer: >60 mL/min (ref >60)
GFR calc non Af Amer: >60 mL/min (ref >60)
Glucose, Bld: 128 mg/dL — ABNORMAL HIGH (ref 70–99)
Potassium: 3.6 mmol/L (ref 3.5–5.1)
Sodium: 134 mmol/L — ABNORMAL LOW (ref 135–145)

## 2020-03-15 MED ORDER — LEVOFLOXACIN 750 MG PO TABS
750.0000 mg | ORAL_TABLET | Freq: Once | ORAL | Status: AC
  Administered 2020-03-15: 750 mg via ORAL
  Filled 2020-03-15: qty 1

## 2020-03-15 MED ORDER — LEVOFLOXACIN 750 MG PO TABS: 750.0000 mg | ORAL_TABLET | Freq: Every day | ORAL | 0 refills | Status: AC

## 2020-03-15 NOTE — Progress Notes (Signed)
Murriel Hopper to be D/C'd home with husband per MD order.  Discussed prescriptions and follow up appointments with the patient. Prescriptions given to patient, medication list explained in detail. Pt verbalized understanding.  Allergies as of 03/15/2020       Reactions   Biaxin [clarithromycin]    Reaction unknown   Maxzide [hydrochlorothiazide W-triamterene]    Reaction unknown   Penicillins Rash   Sulfa Antibiotics Rash        Medication List     STOP taking these medications    Eliquis 5 MG Tabs tablet Generic drug: apixaban       TAKE these medications    acetaminophen 650 MG CR tablet Commonly known as: TYLENOL Take 1,300 mg by mouth every 8 (eight) hours as needed for pain.   aspirin EC 325 MG tablet Take 325 mg by mouth daily.   atenolol 25 MG tablet Commonly known as: TENORMIN Take 25 mg by mouth daily.   CALCIUM 600 + D PO Take 1 tablet by mouth daily.   calcium carbonate 500 MG chewable tablet Commonly known as: TUMS - dosed in mg elemental calcium Chew 1 tablet by mouth daily as needed for indigestion or heartburn.   Cholecalciferol 50 MCG (2000 UT) Tabs Take 2,000 Units by mouth daily.   docusate sodium 100 MG capsule Commonly known as: COLACE Take 200 mg by mouth at bedtime.   etodolac 400 MG tablet Commonly known as: LODINE Take 400 mg by mouth daily.   levofloxacin 750 MG tablet Commonly known as: Levaquin Take 1 tablet (750 mg total) by mouth daily for 5 days.   multivitamin with minerals Tabs tablet Take 1 tablet by mouth daily.   pantoprazole 40 MG tablet Commonly known as: PROTONIX Take 40 mg by mouth daily.   PARoxetine 20 MG tablet Commonly known as: PAXIL Take 20 mg by mouth daily.   polyethylene glycol 17 g packet Commonly known as: MIRALAX / GLYCOLAX Take 17 g by mouth daily.   pravastatin 20 MG tablet Commonly known as: PRAVACHOL Take 20 mg by mouth at bedtime.   sodium chloride 1 g tablet Take 1 g by mouth  daily.   traMADol 50 MG tablet Commonly known as: ULTRAM Take 1 tablet (50 mg total) by mouth daily as needed for moderate pain. What changed:  how much to take when to take this   vitamin C 1000 MG tablet Take 1,000 mg by mouth daily.        Vitals:   03/15/20 0025 03/15/20 0534  BP: (!) 150/60 (!) 163/77  Pulse: 61 79  Resp:  17  Temp: 98.3 F (36.8 C) 97.7 F (36.5 C)  SpO2: 97% 97%    Skin clean, dry and intact without evidence of skin break down, no evidence of skin tears noted. IV catheter discontinued intact. Site without signs and symptoms of complications. Dressing and pressure applied. Pt denies pain at this time. No complaints noted.  An After Visit Summary was printed and given to the patient. Patient escorted via [place], and D/C home via private auto.  [place] A Tyreik Delahoussaye

## 2020-03-15 NOTE — Discharge Summary (Signed)
Physician Discharge Summary [facility]    Patient: Mary Rice                   Admit date: 03/14/2020   DOB: August 12, 1932             Discharge date:03/15/2020/7:19 AM ONG:[id number]                          PCP: Mary Aus, MD  Disposition: Home with daughter.  Recommendations for Outpatient Follow-up:   . Follow up: in 1 week  Discharge Condition: Stable   Code Status:   Code Status: Full Code  Diet recommendation: Regular healthy diet   Discharge Diagnoses:    Principal Problem:   Pneumonia involving right lung Active Problems:   Atrial fibrillation ([place])   Essential hypertension   Hypoxia   Nausea and vomiting   Delirium   History of Present Illness/ Hospital Course Mary Rice Summary:   HPI: Mary Starkey Cobleis a 86 y.o.fw PMH/oprovoked DVT/PE and not on anticoagulation,HTN/ HLD, A. fib whopresents by EMS withaltered mental status.  According to the daughter, Ms. Coblehas had progressively worsening altered mental status for several weeks.She had a fall which resulted in thoracic compression fracture for which she underwent kyphoplasty 17 days ago by Dr. Rudene Christians. According to the daughter, patient had one DVT and PE after knee surgery several years ago. Since the fall a few months ago she was taken off eliquis at that time with no plan to ever resume anticoagulation.  Mary Rice had Covid-19 infection last year.  ED Course: She was found hypoxic and has right upper and right middle lobe pneumonia on chest x-ray and CT scan of her chest.  COVID swab is negative.  Patient did receive antibiotic she had the procedure of kyphoplasty 2 and half weeks ago according to the daughter.    Pneumonia involving right lung, ?  Aspiration versus HCAP -Sepsis ruled out - (mildly bradycardic, respiratory rate 23, blood pressure 136/57, admission 98/52, no endorgan damage) -CTA negative for pulmonary embolism, chronic changes  -She has right-sided  infiltrates on chest x-ray and CT of her chest.  - She is placed on empiric coverage of antibiotic with cefepime, vancomycin and Flagyl to cover  Patient procalcitonin normal, normal WBCs, afebrile, normotensive cultures x24 - --family strong believe that she will do okay on oral antibiotics, therefore we switched to p.o. Levaquin to be discharged home.    -Incentive spirometer every 2 hours while awake. -Oxygen supplementation as needed to keep O2 sat between 92 to 96%. Was on 2 L of oxygen, patient has been weaned off to room air  (not O2 dependent at home)   Active Problems: Acute respiratory distress - hypoxia -Improving -satting 98% on 2 L of oxygen .... Weaned off to room air -Continue as needed inhalers -CT angiogram reviewed, negative for pulmonary embolism -Supplemental oxygen as above. RT to follow and incentive spirometer while awake.  Atrial fibrillation [facility]) Patient is chronic atrial fibrillation which is stable and rate controlled. Daughter states that she was taken off of Eliquis a few months ago secondary to falls and she reports an July she had a fall and hit the back of her head at that time anticoagulation with Eliquis was stopped and she was placed on full dose aspirin.Daughter does not want her back on full anticoagulation due to risk of falls  Essential hypertension  -Stable on home meds Home medications will be  verified and reconciled in the morning and resumed. Monitor blood pressure  Delirium -Resolved Patient with delirium when daughter found her.She states after she arrived to the hospital she was more alert and oriented to her daughter and surroundings. She is currently asleep. Will reevaluate delirium in the morning when awake  Nausea and vomiting  -Resolved tolerating p.o.   Dehydration -Continue gentle IV fluid hydration... Discontinue   History of A. fib, PE, DVT -Patient has been on Eliquis, recently has been taken  off Eliquis by PCP due to fall The family and patient requesting Eliquis to be DC'd upon discharge if not needed CT of chest is negative for pulmonary embolism now -Family patient willing to continue aspirin     Cultures; Blood Cultures x 2 >>negative X 24 hrs  Sputum Culture not obtained  Antimicrobials: cefepime, vancomycin and Flagyl   Consultants: None   ----------------------------------------------------------------------------------------------------------------------------------------------    Code Status:Full code Family Communication: Discussed with daughter... reporting patient has a good support at home upon discharge will be taken home under care of family and home health Disposition Plan: Patient is from:Home Anticipated DC XI:[facility] with her daughter     Discharge Instructions:   Discharge Instructions    Activity as tolerated - No restrictions   Complete by: As directed    Diet - low sodium heart healthy   Complete by: As directed    Discharge instructions   Complete by: As directed    F/up with PCP in 1 wk..   Increase activity slowly   Complete by: As directed        Medication List    TAKE these medications   acetaminophen 650 MG CR tablet Commonly known as: TYLENOL Take 1,300 mg by mouth every 8 (eight) hours as needed for pain.   aspirin EC 325 MG tablet Take 325 mg by mouth daily.   atenolol 25 MG tablet Commonly known as: TENORMIN Take 25 mg by mouth daily.   CALCIUM 600 + D PO Take 1 tablet by mouth daily.   calcium carbonate 500 MG chewable tablet Commonly known as: TUMS - dosed in mg elemental calcium Chew 1 tablet by mouth daily as needed for indigestion or heartburn.   Cholecalciferol 50 MCG (2000 UT) Tabs Take 2,000 Units by mouth daily.   docusate sodium 100 MG capsule Commonly known as: COLACE Take 200 mg by mouth at bedtime.     Eliquis 5 MG Tabs tablet Generic drug: apixaban Take 5 mg by mouth 2 (two) times daily.   etodolac 400 MG tablet Commonly known as: LODINE Take 400 mg by mouth daily.   levofloxacin 750 MG tablet Commonly known as: Levaquin Take 1 tablet (750 mg total) by mouth daily for 5 days.   multivitamin with minerals Tabs tablet Take 1 tablet by mouth daily.   pantoprazole 40 MG tablet Commonly known as: PROTONIX Take 40 mg by mouth daily.   PARoxetine 20 MG tablet Commonly known as: PAXIL Take 20 mg by mouth daily.   polyethylene glycol 17 g packet Commonly known as: MIRALAX / GLYCOLAX Take 17 g by mouth daily.   pravastatin 20 MG tablet Commonly known as: PRAVACHOL Take 20 mg by mouth at bedtime.   sodium chloride 1 g tablet Take 1 g by mouth daily.   traMADol 50 MG tablet Commonly known as: ULTRAM Take 1 tablet (50 mg total) by mouth daily as needed for moderate pain. What changed:   how much to take  when to take this  vitamin C 1000 MG tablet Take 1,000 mg by mouth daily.       Allergies  Allergen Reactions  . Biaxin [Clarithromycin]     Reaction unknown  . Maxzide [Hydrochlorothiazide W-Triamterene]     Reaction unknown  . Penicillins Rash  . Sulfa Antibiotics Rash     Procedures /Studies:   DG Chest 1 View  Result Date: 03/13/2020 CLINICAL DATA:  Altered mental status EXAM: CHEST  1 VIEW COMPARISON:  08/21/2016 FINDINGS: There is focal pulmonary infiltrate noted within the right upper lobe, likely infectious in the appropriate clinical setting. No pneumothorax or pleural effusion. Cardiac size within normal limits. Pulmonary vascularity is normal. No acute bone abnormality. IMPRESSION: Focal pulmonary infiltrate in the right upper lobe, likely infectious in the appropriate clinical setting. Electronically Signed   By: Fidela Salisbury MD   On: 03/13/2020 21:10   DG Thoracic Spine 2 View  Result Date: 02/26/2020 CLINICAL DATA:  T11 kyphoplasty EXAM: DG  C-ARM 1-60 MIN; THORACIC SPINE 2 VIEWS FLUOROSCOPY TIME:  Fluoroscopy Time:  1 minutes 36 seconds Radiation Exposure Index (if provided by the fluoroscopic device): 21.241 mGy Number of Acquired Spot Images: 2 COMPARISON:  MR lumbar spine 02/17/2020 FINDINGS: Interval T11 kyphoplasty with mild residual height loss. Diffuse osseous demineralization. Degenerative disc disease changes T9-T10 and T10-T11. IMPRESSION: T11 kyphoplasty. Electronically Signed   By: Lavonia Dana M.D.   On: 02/26/2020 12:43   DG Abdomen 1 View  Result Date: 03/13/2020 CLINICAL DATA:  Ileus EXAM: ABDOMEN - 1 VIEW COMPARISON:  None. FINDINGS: Normal abdominal gas pattern. Moderate stool within the rectal vault. No gross free intraperitoneal gas. Cholecystectomy clips noted within the right upper quadrant. T11 vertebroplasty has been performed. Moderate lumbar levoscoliosis. No acute bone abnormality. IMPRESSION: Moderate stool within the rectal vault. No evidence of bowel obstruction or free intraperitoneal gas. Electronically Signed   By: Fidela Salisbury MD   On: 03/13/2020 21:12   CT Head Wo Contrast  Result Date: 03/13/2020 CLINICAL DATA:  Altered mental status EXAM: CT HEAD WITHOUT CONTRAST TECHNIQUE: Contiguous axial images were obtained from the base of the skull through the vertex without intravenous contrast. COMPARISON:  None. FINDINGS: Brain: There is atrophy and chronic small vessel disease changes. No acute intracranial abnormality. Specifically, no hemorrhage, hydrocephalus, mass lesion, acute infarction, or significant intracranial injury. Vascular: No hyperdense vessel or unexpected calcification. Skull: No acute calvarial abnormality. Sinuses/Orbits: Visualized paranasal sinuses and mastoids clear. Orbital soft tissues unremarkable. Other: None IMPRESSION: Atrophy, chronic microvascular disease. No acute intracranial abnormality. Electronically Signed   By: Rolm Baptise M.D.   On: 03/13/2020 21:14   CT Angio Chest PE W  and/or Wo Contrast  Result Date: 03/14/2020 CLINICAL DATA:  Altered mental status. Vomiting. Distended stomach. High probability for pulmonary embolus. EXAM: CT ANGIOGRAPHY CHEST CT ABDOMEN AND PELVIS WITH CONTRAST TECHNIQUE: Multidetector CT imaging of the chest was performed using the standard protocol during bolus administration of intravenous contrast. Multiplanar CT image reconstructions and MIPs were obtained to evaluate the vascular anatomy. Multidetector CT imaging of the abdomen and pelvis was performed using the standard protocol during bolus administration of intravenous contrast. CONTRAST:  118mL OMNIPAQUE IOHEXOL 350 MG/ML SOLN COMPARISON:  None. FINDINGS: CTA CHEST FINDINGS Cardiovascular: Good opacification of the central and segmental pulmonary arteries. No focal filling defects. No evidence of significant pulmonary embolus. Normal heart size. No pericardial effusions. Coronary artery and aortic calcification. No aortic aneurysm. Mediastinum/Nodes: Small esophageal hiatal hernia. Esophagus is decompressed. No significant lymphadenopathy. Thyroid  gland is unremarkable. Lungs/Pleura: Small bilateral pleural effusions with basilar atelectasis. Airspace disease in the right upper lung and right middle lung could represent pneumonia or asymmetrical edema. Musculoskeletal: Old vertebral compression deformity post kyphoplasty. Old rib fractures. No acute bony changes. Review of the MIP images confirms the above findings. CT ABDOMEN and PELVIS FINDINGS Hepatobiliary: Surgical absence of the gallbladder. No bile duct dilatation. Liver is unremarkable. Pancreas: Fatty infiltration of the pancreas. No acute changes identified. Spleen: Normal in size without focal abnormality. Adrenals/Urinary Tract: Adrenal glands are unremarkable. Kidneys are normal, without renal calculi, focal lesion, or hydronephrosis. Bladder is unremarkable. Stomach/Bowel: Stomach, small bowel, and colon are mostly decompressed. Colonic  diverticulosis without evidence of diverticulitis. Appendix is not identified. Vascular/Lymphatic: Aortic atherosclerosis. No enlarged abdominal or pelvic lymph nodes. Reproductive: Uterus is surgically absent. There is a heterogeneous circumscribed presacral mass measuring 3.8 x 3.9 cm in diameter. The mass is partially fat density. This could represent an ovarian lesion in an abnormal location, an old hematoma or area of fat necrosis, or a mass associated with the sacrum. Other: No free air or free fluid in the abdomen. Musculoskeletal: Lumbar scoliosis convex towards the left. Degenerative changes in the spine and hips. No destructive bone lesions. Review of the MIP images confirms the above findings. IMPRESSION: 1. No evidence of significant pulmonary embolus. 2. Small bilateral pleural effusions with basilar atelectasis. 3. Airspace disease in the right upper lung and right middle lung could represent pneumonia or asymmetrical edema. 4. Small esophageal hiatal hernia. 5. Colonic diverticulosis without evidence of diverticulitis. 6. 3.8 cm heterogeneous circumscribed presacral mass with fat density. This could represent an ovarian lesion, old hematoma or area of fat necrosis, or a mass associated with the sacrum. Aortic atherosclerosis. Aortic Atherosclerosis (ICD10-I70.0). Electronically Signed   By: Lucienne Capers M.D.   On: 03/14/2020 03:29   MR LUMBAR SPINE WO CONTRAST  Result Date: 02/17/2020 CLINICAL DATA:  Left side low back pain with movement since a fall 3 weeks ago. Initial encounter. EXAM: MRI LUMBAR SPINE WITHOUT CONTRAST TECHNIQUE: Multiplanar, multisequence MR imaging of the lumbar spine was performed. No intravenous contrast was administered. COMPARISON:  MRI thoracic spine 07/08/2019. FINDINGS: Segmentation:  Standard. Alignment: There is severe convex left scoliosis with the apex at L2. No listhesis. Vertebrae: The patient has a biconcave compression fracture of T11 with vertebral body  height loss centrally of approximately 50%. There is also a Schmorl's node in the superior endplate of V40 with surrounding marrow edema. The Schmorl's node is new since the prior MRI. Scattered hemangiomas are noted. Conus medullaris and cauda equina: Conus extends to the L1-2 level. Conus and cauda equina appear normal. Paraspinal and other soft tissues: Negative. Disc levels: T10-11: Mild ligamentum flavum thickening and facet degenerative disease. No disc bulge or protrusion. No stenosis. T11-12: Shallow broad-based left paracentral protrusion and ligamentum flavum thickening appear unchanged. No bony retropulsion off the inferior endplate. The foramina are open. T12-L1: Negative. L1-2: Shallow disc bulge and endplate spur without stenosis. L2-3: Shallow disc bulge and endplate spur without stenosis. L3-4: Minimal disc bulge and mild facet degenerative disease. No stenosis. L4-5: Shallow disc bulge to the left with some facet degenerative change. There is mild left foraminal narrowing. The central canal and right foramen are open. L5-S1: Negative. IMPRESSION: Acute or subacute biconcave compression fracture of T11 with vertebral body height loss of up to 50%. No bony retropulsion. Schmorl's node in the superior endplate of G86 with mild surrounding marrow edema is new  since the prior MRI. Shallow broad-based central and left paracentral protrusion at T11-12 causes mild central canal narrowing, mildly progressive since the prior exam. Severe convex left lumbar scoliosis. Mild lumbar degenerative disc disease without central canal narrowing. Mild left foraminal narrowing at L4-5 noted. Electronically Signed   By: Inge Rise M.D.   On: 02/17/2020 11:12   CT ABDOMEN PELVIS W CONTRAST  Result Date: 03/14/2020 CLINICAL DATA:  Altered mental status. Vomiting. Distended stomach. High probability for pulmonary embolus. EXAM: CT ANGIOGRAPHY CHEST CT ABDOMEN AND PELVIS WITH CONTRAST TECHNIQUE: Multidetector CT  imaging of the chest was performed using the standard protocol during bolus administration of intravenous contrast. Multiplanar CT image reconstructions and MIPs were obtained to evaluate the vascular anatomy. Multidetector CT imaging of the abdomen and pelvis was performed using the standard protocol during bolus administration of intravenous contrast. CONTRAST:  160mL OMNIPAQUE IOHEXOL 350 MG/ML SOLN COMPARISON:  None. FINDINGS: CTA CHEST FINDINGS Cardiovascular: Good opacification of the central and segmental pulmonary arteries. No focal filling defects. No evidence of significant pulmonary embolus. Normal heart size. No pericardial effusions. Coronary artery and aortic calcification. No aortic aneurysm. Mediastinum/Nodes: Small esophageal hiatal hernia. Esophagus is decompressed. No significant lymphadenopathy. Thyroid gland is unremarkable. Lungs/Pleura: Small bilateral pleural effusions with basilar atelectasis. Airspace disease in the right upper lung and right middle lung could represent pneumonia or asymmetrical edema. Musculoskeletal: Old vertebral compression deformity post kyphoplasty. Old rib fractures. No acute bony changes. Review of the MIP images confirms the above findings. CT ABDOMEN and PELVIS FINDINGS Hepatobiliary: Surgical absence of the gallbladder. No bile duct dilatation. Liver is unremarkable. Pancreas: Fatty infiltration of the pancreas. No acute changes identified. Spleen: Normal in size without focal abnormality. Adrenals/Urinary Tract: Adrenal glands are unremarkable. Kidneys are normal, without renal calculi, focal lesion, or hydronephrosis. Bladder is unremarkable. Stomach/Bowel: Stomach, small bowel, and colon are mostly decompressed. Colonic diverticulosis without evidence of diverticulitis. Appendix is not identified. Vascular/Lymphatic: Aortic atherosclerosis. No enlarged abdominal or pelvic lymph nodes. Reproductive: Uterus is surgically absent. There is a heterogeneous  circumscribed presacral mass measuring 3.8 x 3.9 cm in diameter. The mass is partially fat density. This could represent an ovarian lesion in an abnormal location, an old hematoma or area of fat necrosis, or a mass associated with the sacrum. Other: No free air or free fluid in the abdomen. Musculoskeletal: Lumbar scoliosis convex towards the left. Degenerative changes in the spine and hips. No destructive bone lesions. Review of the MIP images confirms the above findings. IMPRESSION: 1. No evidence of significant pulmonary embolus. 2. Small bilateral pleural effusions with basilar atelectasis. 3. Airspace disease in the right upper lung and right middle lung could represent pneumonia or asymmetrical edema. 4. Small esophageal hiatal hernia. 5. Colonic diverticulosis without evidence of diverticulitis. 6. 3.8 cm heterogeneous circumscribed presacral mass with fat density. This could represent an ovarian lesion, old hematoma or area of fat necrosis, or a mass associated with the sacrum. Aortic atherosclerosis. Aortic Atherosclerosis (ICD10-I70.0). Electronically Signed   By: Lucienne Capers M.D.   On: 03/14/2020 03:29   DG C-Arm 1-60 Min  Result Date: 02/26/2020 CLINICAL DATA:  T11 kyphoplasty EXAM: DG C-ARM 1-60 MIN; THORACIC SPINE 2 VIEWS FLUOROSCOPY TIME:  Fluoroscopy Time:  1 minutes 36 seconds Radiation Exposure Index (if provided by the fluoroscopic device): 21.241 mGy Number of Acquired Spot Images: 2 COMPARISON:  MR lumbar spine 02/17/2020 FINDINGS: Interval T11 kyphoplasty with mild residual height loss. Diffuse osseous demineralization. Degenerative disc disease changes T9-T10 and  T10-T11. IMPRESSION: T11 kyphoplasty. Electronically Signed   By: Lavonia Dana M.D.   On: 02/26/2020 12:43     Subjective:   Patient was seen and examined 03/15/2020, 7:19 AM Patient stable today. No acute distress.  No issues overnight Stable for discharge.  Discharge Exam:    Vitals:   03/14/20 1630 03/14/20 1928  03/15/20 0025 03/15/20 0534  BP: (!) 155/74 (!) 178/74 (!) 150/60 (!) 163/77  Pulse: 65 77 61 79  Resp:  18  17  Temp:  98.5 F (36.9 C) 98.3 F (36.8 C) 97.7 F (36.5 C)  TempSrc:  Oral Oral Oral  SpO2: 97% 97% 97% 97%  Weight:      Height:        General: Pt lying comfortably in bed & appears in no obvious distress. Cardiovascular: S1 & S2 heard, RRR, S1/S2 +. No murmurs, rubs, gallops or clicks. No JVD or pedal edema. Respiratory: Clear to auscultation without wheezing, rhonchi or crackles. No increased work of breathing. Abdominal:  Non-distended, non-tender & soft. No organomegaly or masses appreciated. Normal bowel sounds heard. CNS: Alert and oriented. No focal deficits. Extremities: no edema, no cyanosis    The results of significant diagnostics from this hospitalization (including imaging, microbiology, ancillary and laboratory) are listed below for reference.      Microbiology:   Recent Results (from the past 240 hour(s))  Culture, blood (Routine x 2)     Status: None (Preliminary result)   Collection Time: 03/13/20  7:55 PM   Specimen: BLOOD  Result Value Ref Range Status   Specimen Description BLOOD BLOOD LEFT FOREARM  Final   Special Requests   Final    BOTTLES DRAWN AEROBIC AND ANAEROBIC Blood Culture adequate volume   Culture   Final    NO GROWTH < 24 HOURS Performed at [facility], [address] [place], [place] [address]    Report Status PENDING  Incomplete  Culture, blood (Routine x 2)     Status: None (Preliminary result)   Collection Time: 03/13/20  8:00 PM   Specimen: BLOOD  Result Value Ref Range Status   Specimen Description BLOOD LEFT RADIAL  Final   Special Requests   Final    BOTTLES DRAWN AEROBIC AND ANAEROBIC Blood Culture adequate volume   Culture   Final    NO GROWTH < 24 HOURS Performed at [facility], [address], [place], [place] [address]    Report Status PENDING  Incomplete  SARS Coronavirus 2 by RT  PCR (hospital order, performed in [place] hospital lab) Nasopharyngeal Nasopharyngeal Swab     Status: None   Collection Time: 03/13/20  8:20 PM   Specimen: Nasopharyngeal Swab  Result Value Ref Range Status   SARS Coronavirus 2 NEGATIVE NEGATIVE Final    Comment: (NOTE) SARS-CoV-2 target nucleic acids are NOT DETECTED.  The SARS-CoV-2 RNA is generally detectable in upper and lower respiratory specimens during the acute phase of infection. The lowest concentration of SARS-CoV-2 viral copies this assay can detect is 250 copies / mL. A negative result does not preclude SARS-CoV-2 infection and should not be used as the sole basis for treatment or other patient management decisions.  A negative result may occur with improper specimen collection / handling, submission of specimen other than nasopharyngeal swab, presence of viral mutation(s) within the areas targeted by this assay, and inadequate number of viral copies (<250 copies / mL). A negative result must be combined with clinical observations, patient history,  and epidemiological information.  Fact Sheet for Patients:   StrictlyIdeas.no  Fact Sheet for Healthcare Providers: BankingDealers.co.za  This test is not yet approved or  cleared by the [place] FDA and has been authorized for detection and/or diagnosis of SARS-CoV-2 by FDA under an Emergency Use Authorization (EUA).  This EUA will remain in effect (meaning this test can be used) for the duration of the COVID-19 declaration under Section 564(b)(1) of the Act, 21 U.S.C. section 360bbb-3(b)(1), unless the authorization is terminated or revoked sooner.  Performed at [facility], [address] [place], [place] [address]      Labs:   CBC: Recent Labs  Lab 03/13/20 1955 03/14/20 0723 03/15/20 0443  WBC 12.0* 9.0 8.2  NEUTROABS 9.9*  --   --   HGB 14.5 13.4 13.0  HCT 43.4 40.3 37.6  MCV 88.8 88.0  86.2  PLT 255 227 544   Basic Metabolic Panel: Recent Labs  Lab 03/13/20 1955 03/14/20 0723 03/15/20 0443  NA 135 136 134*  K 3.8 4.2 3.6  CL 97* 103 100  CO2 26 23 26   GLUCOSE 131* 108* 128*  BUN 19 18 14   CREATININE 0.72 0.66 0.66  CALCIUM 9.2 8.4* 8.5*   Liver Function Tests: Recent Labs  Lab 03/13/20 1955  AST 24  ALT 22  ALKPHOS 82  BILITOT 1.2  PROT 6.6  ALBUMIN 3.8   BNP (last 3 results) No results for input(s): BNP in the last 8760 hours. Cardiac Enzymes: No results for input(s): CKTOTAL, CKMB, CKMBINDEX, TROPONINI in the last 168 hours. CBG: No results for input(s): GLUCAP in the last 168 hours. Hgb A1c No results for input(s): HGBA1C in the last 72 hours. Lipid Profile No results for input(s): CHOL, HDL, LDLCALC, TRIG, CHOLHDL, LDLDIRECT in the last 72 hours. Thyroid function studies No results for input(s): TSH, T4TOTAL, T3FREE, THYROIDAB in the last 72 hours.  Invalid input(s): FREET3 Anemia work up No results for input(s): VITAMINB12, FOLATE, FERRITIN, TIBC, IRON, RETICCTPCT in the last 72 hours. Urinalysis    Component Value Date/Time   COLORURINE YELLOW (A) 03/14/2020 0232   APPEARANCEUR CLOUDY (A) 03/14/2020 0232   LABSPEC 1.023 03/14/2020 0232   PHURINE 5.0 03/14/2020 0232   GLUCOSEU NEGATIVE 03/14/2020 0232   HGBUR NEGATIVE 03/14/2020 0232   BILIRUBINUR NEGATIVE 03/14/2020 0232   KETONESUR 20 (A) 03/14/2020 0232   PROTEINUR 100 (A) 03/14/2020 0232   NITRITE NEGATIVE 03/14/2020 0232   LEUKOCYTESUR NEGATIVE 03/14/2020 0232         Time coordinating discharge: Over 45 minutes  SIGNED: Deatra James, MD, FACP, FHM. [facility],  Please use [email or web address] to Page If 7PM-7AM, please contact night-coverage [email or web address] 03/15/2020, 7:19 AM

## 2020-03-18 LAB — CULTURE, BLOOD (ROUTINE X 2)
Culture: NO GROWTH
Culture: NO GROWTH
Special Requests: ADEQUATE
Special Requests: ADEQUATE

## 2020-03-21 ENCOUNTER — Other Ambulatory Visit: Payer: Self-pay

## 2020-03-21 ENCOUNTER — Encounter (HOSPITAL_COMMUNITY): Payer: Self-pay

## 2020-03-21 ENCOUNTER — Emergency Department (HOSPITAL_COMMUNITY): Payer: Medicare Other

## 2020-03-21 ENCOUNTER — Emergency Department (HOSPITAL_BASED_OUTPATIENT_CLINIC_OR_DEPARTMENT_OTHER): Payer: Medicare Other

## 2020-03-21 ENCOUNTER — Inpatient Hospital Stay (HOSPITAL_COMMUNITY): Payer: Medicare Other

## 2020-03-21 ENCOUNTER — Inpatient Hospital Stay (HOSPITAL_COMMUNITY)
Admission: EM | Admit: 2020-03-21 | Discharge: 2020-03-23 | DRG: 884 | Disposition: A | Discharge status: Home Health | Payer: Medicare Other | Attending: Internal Medicine | Admitting: Internal Medicine

## 2020-03-21 DIAGNOSIS — R197 Diarrhea, unspecified: Secondary | ICD-10-CM

## 2020-03-21 DIAGNOSIS — A419 Sepsis, unspecified organism: Secondary | ICD-10-CM

## 2020-03-21 DIAGNOSIS — E871 Hypo-osmolality and hyponatremia: Secondary | ICD-10-CM | POA: Diagnosis present

## 2020-03-21 DIAGNOSIS — K5900 Constipation, unspecified: Secondary | ICD-10-CM | POA: Diagnosis present

## 2020-03-21 DIAGNOSIS — M7989 Other specified soft tissue disorders: Secondary | ICD-10-CM | POA: Diagnosis not present

## 2020-03-21 DIAGNOSIS — E861 Hypovolemia: Secondary | ICD-10-CM | POA: Diagnosis present

## 2020-03-21 DIAGNOSIS — R339 Retention of urine, unspecified: Secondary | ICD-10-CM | POA: Diagnosis not present

## 2020-03-21 DIAGNOSIS — R338 Other retention of urine: Secondary | ICD-10-CM | POA: Diagnosis not present

## 2020-03-21 DIAGNOSIS — K222 Esophageal obstruction: Secondary | ICD-10-CM | POA: Diagnosis present

## 2020-03-21 DIAGNOSIS — Z86718 Personal history of other venous thrombosis and embolism: Secondary | ICD-10-CM | POA: Diagnosis not present

## 2020-03-21 DIAGNOSIS — Z20822 Contact with and (suspected) exposure to covid-19: Secondary | ICD-10-CM | POA: Diagnosis present

## 2020-03-21 DIAGNOSIS — Z79899 Other long term (current) drug therapy: Secondary | ICD-10-CM | POA: Diagnosis not present

## 2020-03-21 DIAGNOSIS — E785 Hyperlipidemia, unspecified: Secondary | ICD-10-CM | POA: Diagnosis present

## 2020-03-21 DIAGNOSIS — I4891 Unspecified atrial fibrillation: Secondary | ICD-10-CM | POA: Diagnosis not present

## 2020-03-21 DIAGNOSIS — Z86711 Personal history of pulmonary embolism: Secondary | ICD-10-CM | POA: Diagnosis not present

## 2020-03-21 DIAGNOSIS — Z881 Allergy status to other antibiotic agents status: Secondary | ICD-10-CM

## 2020-03-21 DIAGNOSIS — I4821 Permanent atrial fibrillation: Secondary | ICD-10-CM | POA: Diagnosis present

## 2020-03-21 DIAGNOSIS — I808 Phlebitis and thrombophlebitis of other sites: Secondary | ICD-10-CM | POA: Diagnosis present

## 2020-03-21 DIAGNOSIS — E876 Hypokalemia: Secondary | ICD-10-CM | POA: Diagnosis not present

## 2020-03-21 DIAGNOSIS — K449 Diaphragmatic hernia without obstruction or gangrene: Secondary | ICD-10-CM | POA: Diagnosis present

## 2020-03-21 DIAGNOSIS — G9341 Metabolic encephalopathy: Secondary | ICD-10-CM

## 2020-03-21 DIAGNOSIS — Z888 Allergy status to other drugs, medicaments and biological substances status: Secondary | ICD-10-CM

## 2020-03-21 DIAGNOSIS — R509 Fever, unspecified: Secondary | ICD-10-CM | POA: Diagnosis present

## 2020-03-21 DIAGNOSIS — G92 Toxic encephalopathy: Secondary | ICD-10-CM | POA: Diagnosis present

## 2020-03-21 DIAGNOSIS — Z880 Allergy status to penicillin: Secondary | ICD-10-CM | POA: Diagnosis not present

## 2020-03-21 DIAGNOSIS — R400 Somnolence: Secondary | ICD-10-CM | POA: Diagnosis not present

## 2020-03-21 DIAGNOSIS — Z7982 Long term (current) use of aspirin: Secondary | ICD-10-CM | POA: Diagnosis not present

## 2020-03-21 DIAGNOSIS — Z8249 Family history of ischemic heart disease and other diseases of the circulatory system: Secondary | ICD-10-CM | POA: Diagnosis not present

## 2020-03-21 DIAGNOSIS — F0390 Unspecified dementia without behavioral disturbance: Principal | ICD-10-CM | POA: Diagnosis present

## 2020-03-21 DIAGNOSIS — Z882 Allergy status to sulfonamides status: Secondary | ICD-10-CM | POA: Diagnosis not present

## 2020-03-21 DIAGNOSIS — I10 Essential (primary) hypertension: Secondary | ICD-10-CM | POA: Diagnosis present

## 2020-03-21 DIAGNOSIS — R531 Weakness: Secondary | ICD-10-CM

## 2020-03-21 DIAGNOSIS — R4182 Altered mental status, unspecified: Secondary | ICD-10-CM | POA: Diagnosis present

## 2020-03-21 DIAGNOSIS — L03114 Cellulitis of left upper limb: Secondary | ICD-10-CM | POA: Diagnosis present

## 2020-03-21 LAB — COMPREHENSIVE METABOLIC PANEL
ALT: 16 U/L (ref 0–44)
AST: 19 U/L (ref 15–41)
Albumin: 3.8 g/dL (ref 3.5–5.0)
Alkaline Phosphatase: 65 U/L (ref 38–126)
Anion gap: 12 (ref 5–15)
BUN: 15 mg/dL (ref 8–23)
CO2: 25 mmol/L (ref 22–32)
Calcium: 8.9 mg/dL (ref 8.9–10.3)
Chloride: 94 mmol/L — ABNORMAL LOW (ref 98–111)
Creatinine, Ser: 0.65 mg/dL (ref 0.44–1.00)
GFR calc Af Amer: >60 mL/min (ref >60)
GFR calc non Af Amer: >60 mL/min (ref >60)
Glucose, Bld: 132 mg/dL — ABNORMAL HIGH (ref 70–99)
Potassium: 3.7 mmol/L (ref 3.5–5.1)
Sodium: 131 mmol/L — ABNORMAL LOW (ref 135–145)
Total Bilirubin: 0.9 mg/dL (ref 0.3–1.2)
Total Protein: 6.6 g/dL (ref 6.5–8.1)

## 2020-03-21 LAB — URINALYSIS, ROUTINE W REFLEX MICROSCOPIC
Bacteria, UA: NONE SEEN
Bilirubin Urine: NEGATIVE
Glucose, UA: NEGATIVE mg/dL
Hgb urine dipstick: NEGATIVE
Ketones, ur: 20 mg/dL — AB
Leukocytes,Ua: NEGATIVE
Nitrite: NEGATIVE
Protein, ur: 100 mg/dL — AB
Specific Gravity, Urine: 1.02 (ref 1.005–1.030)
pH: 6 (ref 5.0–8.0)

## 2020-03-21 LAB — CBC WITH DIFFERENTIAL/PLATELET
Abs Immature Granulocytes: 0.14 10*3/uL — ABNORMAL HIGH (ref 0.00–0.07)
Basophils Absolute: 0 10*3/uL (ref 0.0–0.1)
Basophils Relative: 0 %
Eosinophils Absolute: 0 10*3/uL (ref 0.0–0.5)
Eosinophils Relative: 0 %
HCT: 44.3 % (ref 36.0–46.0)
Hemoglobin: 14.7 g/dL (ref 12.0–15.0)
Immature Granulocytes: 1 %
Lymphocytes Relative: 4 %
Lymphs Abs: 0.5 10*3/uL — ABNORMAL LOW (ref 0.7–4.0)
MCH: 29.6 pg (ref 26.0–34.0)
MCHC: 33.2 g/dL (ref 30.0–36.0)
MCV: 89.3 fL (ref 80.0–100.0)
Monocytes Absolute: 1.2 10*3/uL — ABNORMAL HIGH (ref 0.1–1.0)
Monocytes Relative: 9 %
Neutro Abs: 12.4 10*3/uL — ABNORMAL HIGH (ref 1.7–7.7)
Neutrophils Relative %: 86 %
Platelets: 225 10*3/uL (ref 150–400)
RBC: 4.96 MIL/uL (ref 3.87–5.11)
RDW: 17.1 % — ABNORMAL HIGH (ref 11.5–15.5)
WBC: 14.3 10*3/uL — ABNORMAL HIGH (ref 4.0–10.5)
nRBC: 0 % (ref 0.0–0.2)

## 2020-03-21 LAB — LACTIC ACID, PLASMA
Lactic Acid, Venous: 2.1 mmol/L (ref 0.5–1.9)
Lactic Acid, Venous: 1.3 mmol/L (ref 0.5–1.9)

## 2020-03-21 LAB — PROTIME-INR
INR: 1.1 (ref 0.8–1.2)
Prothrombin Time: 13.4 seconds (ref 11.4–15.2)

## 2020-03-21 LAB — APTT: aPTT: 33 seconds (ref 24–36)

## 2020-03-21 LAB — SARS CORONAVIRUS 2 BY RT PCR (HOSPITAL ORDER, PERFORMED IN ~~LOC~~ HOSPITAL LAB): SARS Coronavirus 2: NEGATIVE

## 2020-03-21 MED ORDER — SODIUM CHLORIDE 0.9 % IV BOLUS (SEPSIS): 1000.0000 mL | Freq: Once | INTRAVENOUS | Status: DC

## 2020-03-21 MED ORDER — ACETAMINOPHEN 325 MG PO TABS
650.0000 mg | ORAL_TABLET | Freq: Once | ORAL | Status: AC
  Administered 2020-03-21: 650 mg via ORAL
  Filled 2020-03-21: qty 2

## 2020-03-21 MED ORDER — METRONIDAZOLE IN NACL 5-0.79 MG/ML-% IV SOLN
500.0000 mg | Freq: Once | INTRAVENOUS | Status: AC
  Administered 2020-03-21: 500 mg via INTRAVENOUS
  Filled 2020-03-21: qty 100

## 2020-03-21 MED ORDER — ASCORBIC ACID 500 MG PO TABS: 1000.0000 mg | ORAL_TABLET | Freq: Every day | ORAL | Status: DC

## 2020-03-21 MED ORDER — SODIUM CHLORIDE 0.9 % IV SOLN: INTRAVENOUS | Status: DC

## 2020-03-21 MED ORDER — PRAVASTATIN SODIUM 20 MG PO TABS: 20.0000 mg | ORAL_TABLET | Freq: Every day | ORAL | Status: DC

## 2020-03-21 MED ORDER — ONDANSETRON HCL 4 MG/2ML IJ SOLN: 4.0000 mg | Freq: Four times a day (QID) | INTRAMUSCULAR | Status: DC | PRN

## 2020-03-21 MED ORDER — ENOXAPARIN SODIUM 40 MG/0.4ML ~~LOC~~ SOLN
40.0000 mg | SUBCUTANEOUS | Status: DC
  Administered 2020-03-21: 40 mg via SUBCUTANEOUS
  Filled 2020-03-21: qty 0.4

## 2020-03-21 MED ORDER — ASPIRIN EC 325 MG PO TBEC: 325.0000 mg | DELAYED_RELEASE_TABLET | Freq: Every day | ORAL | Status: DC

## 2020-03-21 MED ORDER — ACETAMINOPHEN ER 650 MG PO TBCR: 1300.0000 mg | EXTENDED_RELEASE_TABLET | Freq: Three times a day (TID) | ORAL | Status: DC | PRN

## 2020-03-21 MED ORDER — NYSTATIN 100000 UNIT/ML MT SUSP
5.0000 mL | Freq: Four times a day (QID) | OROMUCOSAL | Status: DC
  Administered 2020-03-21 – 2020-03-23 (×5): 500000 [IU] via ORAL
  Filled 2020-03-21 (×5): qty 5

## 2020-03-21 MED ORDER — SODIUM CHLORIDE 0.9 % IV BOLUS (SEPSIS)
1000.0000 mL | Freq: Once | INTRAVENOUS | Status: AC
  Administered 2020-03-21: 1000 mL via INTRAVENOUS

## 2020-03-21 MED ORDER — SODIUM CHLORIDE 0.9% FLUSH: 3.0000 mL | INTRAVENOUS | Status: DC | PRN

## 2020-03-21 MED ORDER — ADULT MULTIVITAMIN W/MINERALS CH
1.0000 | ORAL_TABLET | Freq: Every day | ORAL | Status: DC
  Administered 2020-03-23: 1 via ORAL
  Filled 2020-03-21: qty 1

## 2020-03-21 MED ORDER — VANCOMYCIN HCL IN DEXTROSE 1-5 GM/200ML-% IV SOLN: 1000.0000 mg | Freq: Once | INTRAVENOUS | Status: DC

## 2020-03-21 MED ORDER — CHOLECALCIFEROL 50 MCG (2000 UT) PO TABS: 2000.0000 [IU] | ORAL_TABLET | Freq: Every day | ORAL | Status: DC

## 2020-03-21 MED ORDER — HYDRALAZINE HCL 20 MG/ML IJ SOLN
10.0000 mg | Freq: Once | INTRAMUSCULAR | Status: AC
  Administered 2020-03-21: 10 mg via INTRAVENOUS
  Filled 2020-03-21: qty 1

## 2020-03-21 MED ORDER — SODIUM CHLORIDE 0.9 % IV SOLN: 250.0000 mL | INTRAVENOUS | Status: DC | PRN

## 2020-03-21 MED ORDER — PANTOPRAZOLE SODIUM 40 MG PO TBEC
40.0000 mg | DELAYED_RELEASE_TABLET | Freq: Every day | ORAL | Status: DC
  Administered 2020-03-23: 40 mg via ORAL
  Filled 2020-03-21: qty 1

## 2020-03-21 MED ORDER — ATENOLOL 25 MG PO TABS
25.0000 mg | ORAL_TABLET | Freq: Every day | ORAL | Status: DC
  Administered 2020-03-23: 25 mg via ORAL
  Filled 2020-03-21: qty 1

## 2020-03-21 MED ORDER — SODIUM CHLORIDE 0.9% FLUSH: 3.0000 mL | Freq: Two times a day (BID) | INTRAVENOUS | Status: DC

## 2020-03-21 MED ORDER — CALCIUM CARBONATE-VITAMIN D 500-200 MG-UNIT PO TABS
1.0000 | ORAL_TABLET | Freq: Two times a day (BID) | ORAL | Status: DC
  Administered 2020-03-22 – 2020-03-23 (×2): 1 via ORAL
  Filled 2020-03-21 (×2): qty 1

## 2020-03-21 MED ORDER — CALCIUM CARBONATE-VITAMIN D 600-400 MG-UNIT PO TABS: 1.0000 | ORAL_TABLET | Freq: Two times a day (BID) | ORAL | Status: DC

## 2020-03-21 MED ORDER — HYDRALAZINE HCL 20 MG/ML IJ SOLN
10.0000 mg | Freq: Once | INTRAMUSCULAR | Status: DC
  Filled 2020-03-21: qty 1

## 2020-03-21 MED ORDER — SODIUM CHLORIDE 0.9 % IV SOLN
2.0000 g | Freq: Once | INTRAVENOUS | Status: AC
  Administered 2020-03-21: 2 g via INTRAVENOUS
  Filled 2020-03-21: qty 2

## 2020-03-21 MED ORDER — ACETAMINOPHEN 650 MG RE SUPP
650.0000 mg | Freq: Four times a day (QID) | RECTAL | Status: DC | PRN
  Administered 2020-03-21: 650 mg via RECTAL
  Filled 2020-03-21: qty 1

## 2020-03-21 MED ORDER — ACETAMINOPHEN 500 MG PO TABS: 1000.0000 mg | ORAL_TABLET | Freq: Three times a day (TID) | ORAL | Status: DC | PRN

## 2020-03-21 MED ORDER — ONDANSETRON HCL 4 MG PO TABS: 4.0000 mg | ORAL_TABLET | Freq: Four times a day (QID) | ORAL | Status: DC | PRN

## 2020-03-21 MED ORDER — VITAMIN D 25 MCG (1000 UNIT) PO TABS: 2000.0000 [IU] | ORAL_TABLET | Freq: Every day | ORAL | Status: DC

## 2020-03-21 MED ORDER — CALCIUM CARBONATE ANTACID 500 MG PO CHEW: 1.0000 | CHEWABLE_TABLET | Freq: Every day | ORAL | Status: DC | PRN

## 2020-03-21 MED ORDER — ASPIRIN 300 MG RE SUPP
300.0000 mg | Freq: Once | RECTAL | Status: DC
  Filled 2020-03-21: qty 1

## 2020-03-21 NOTE — Progress Notes (Signed)
Pt taken for MRI via bed. Daughter states if she can't spend the night with her mother she will not be able to leave her mother here. Jeanella Anton, our Mudlogger, notified and spoke with the daughter. Daughter allowed to spend the night, she is a Government social research officer. The daughter also informed me that she had given her mother 2 doses of Miralax yesterday. Mrs Pangborn had a small brown soft stool this evening,it did not smell like C-Diff.

## 2020-03-21 NOTE — Progress Notes (Addendum)
NP Sharlet Salina was notified of patient's temp- 102.5 and BP 185/69, and making her a red mews. Red mews protocol started. Waiting on new orders.

## 2020-03-21 NOTE — Progress Notes (Signed)
A consult was received from an ED physician for vancomycin and cefepime per pharmacy dosing.  The patient's profile has been reviewed for ht/wt/allergies/indication/available labs.    A one time order has been placed for vancomycin 1000 mg IV and cefepime 2gm IV x1.  Further antibiotics/pharmacy consults should be ordered by admitting physician if indicated.                       Thank you, Lynelle Doctor 03/21/2020  11:18 AM

## 2020-03-21 NOTE — ED Provider Notes (Signed)
[facility] DEPT Provider Note   CSN: [id number] Arrival date & time: 03/21/20  6314     History Chief Complaint  Patient presents with  . Fever  . Cellulitis  . Weakness    Mary Rice is a 86 y.o. female with PMHx DVT/PE and A fib (taken off of anticoags s/2 increased falls), HTN, HLD, who presents to the ED today with complaint of gradual onset, constant, sharp right sided headache x 2 days. Daughter also reports that pt's left arm has appeared weaker than normal for the past 2 days. Per triage report "daughter is concerned for cellulitis in pt's left arm. Pt had been in the hospital and had infiltrated IVs" however during examination daughter states she brought her in concerned that she had a stroke due to HA and left sided arm weakness. LKN > 48 hours ago. Daughter noticed pt had a fever this morning too; took Tylenol last night around 1 AM however daughter states this was for her headache. Pt has a hx of migraine headaches per daughter however pt has appeared diffusely weaker than normal as well. Daughter also mentions that pt was quite constipated - she has been giving her Miralax at home and now she is having diarrhea. Daughter is worried she could have C diff.   Of note pt was admitted to the hospital recently from: 09/06-09/07. She presented for AMS; daughter found pt in bed in a pool of emesis. Pt was found to be hypoxic with findings of right upper and middle lobe PNA on xray and CT of chest with negative COVID. She was covered with Cefepime, Vanc, and Flagyl and switched to oral PO Levaquin 750 mg daily for 5 days.   Pt then saw PCP on 09/09 for hospital follow up. It is mentioned in Reason For Visit to "check left arm" however there is no mention of this in the note itself. Daughter mentioned that PCP looked at the arm however did not think there was any concern for cellulitis. Pt has been complaining of pain to the arm however.   The history is  provided by the patient, a relative and medical records.       Past Medical History:  Diagnosis Date  . DVT (deep venous thrombosis) ([place])   . Dysrhythmia   . Hiatal hernia   . Hyperlipemia   . Hypertension   . Pulmonary embolism [facility])     Patient Active Problem List   Diagnosis Date Noted  . Pneumonia involving right lung 03/14/2020  . Hypoxia 03/14/2020  . Nausea and vomiting 03/14/2020  . Delirium 03/14/2020  . Atrial fibrillation ([place]) 10/10/2015  . SOB (shortness of breath) 10/10/2015  . Obesity 10/10/2015  . Hyperlipidemia 10/10/2015  . Essential hypertension 10/10/2015  . Food impaction of esophagus     Past Surgical History:  Procedure Laterality Date  . ABDOMINAL HYSTERECTOMY    . CATARACT EXTRACTION    . CHOLECYSTECTOMY    . ESOPHAGOGASTRODUODENOSCOPY N/A 08/19/2015   Procedure: ESOPHAGOGASTRODUODENOSCOPY (EGD);  Surgeon: Mauri Pole, MD;  Location: [facility] ENDOSCOPY;  Service: Endoscopy;  Laterality: N/A;  . KYPHOPLASTY N/A 02/26/2020   Procedure: KYPHOPLASTY T11;  Surgeon: Hessie Knows, MD;  Location: [facility] ORS;  Service: Orthopedics;  Laterality: N/A;     OB History   No obstetric history on file.     Family History  Problem Relation Age of Onset  . Heart failure Mother     Social History   Tobacco Use  .  Smoking status: Never Smoker  . Smokeless tobacco: Never Used  Substance Use Topics  . Alcohol use: No  . Drug use: No    Home Medications Prior to Admission medications   Medication Sig Start Date End Date Taking? Authorizing Provider  acetaminophen (TYLENOL) 650 MG CR tablet Take 1,300 mg by mouth every 8 (eight) hours as needed for pain.    [provider]  Ascorbic Acid (VITAMIN C) 1000 MG tablet Take 1,000 mg by mouth daily.    [provider]  aspirin EC 325 MG tablet Take 325 mg by mouth daily.    [provider]  atenolol (TENORMIN) 25 MG tablet Take 25 mg by mouth daily. 08/09/15   [provider]  Calcium Carb-Cholecalciferol (CALCIUM 600 + D PO) Take 1 tablet by mouth daily.    [provider]  calcium carbonate (TUMS - DOSED IN MG ELEMENTAL CALCIUM) 500 MG chewable tablet Chew 1 tablet by mouth daily as needed for indigestion or heartburn.    [provider]  Cholecalciferol 50 MCG (2000 UT) TABS Take 2,000 Units by mouth daily.    [provider]  docusate sodium (COLACE) 100 MG capsule Take 200 mg by mouth at bedtime.    [provider]  etodolac (LODINE) 400 MG tablet Take 400 mg by mouth daily.    [provider]  Multiple Vitamin (MULTIVITAMIN WITH MINERALS) TABS tablet Take 1 tablet by mouth daily.    [provider]  pantoprazole (PROTONIX) 40 MG tablet Take 40 mg by mouth daily.    [provider]  PARoxetine (PAXIL) 20 MG tablet Take 20 mg by mouth daily. Patient not taking: Reported on 03/14/2020    [provider]  polyethylene glycol (MIRALAX / GLYCOLAX) 17 g packet Take 17 g by mouth daily.    [provider]  pravastatin (PRAVACHOL) 20 MG tablet Take 20 mg by mouth at bedtime.     [provider]  sodium chloride 1 g tablet Take 1 g by mouth daily.    [provider]  traMADol (ULTRAM) 50 MG tablet Take 1 tablet (50 mg total) by mouth daily as needed for moderate pain. Patient taking differently: Take 25 mg by mouth 3 (three) times daily.  02/26/20   Hessie Knows, MD    Allergies    Biaxin [clarithromycin], Maxzide [hydrochlorothiazide w-triamterene], Penicillins, and Sulfa antibiotics  Review of Systems   Review of Systems  Constitutional: Positive for fatigue and fever.  Eyes: Negative for visual disturbance.  Gastrointestinal: Positive for constipation, diarrhea and nausea. Negative for vomiting.  Neurological: Positive for weakness (generalized, worse in LUE) and headaches.  All other systems reviewed and are negative.   Physical Exam Updated Vital Signs BP  (!) 185/154   Temp (!) 101.1 F (38.4 C) (Oral)   Resp 18   SpO2 96%   Physical Exam Vitals and nursing note reviewed.  Constitutional:      Appearance: She is obese. She is ill-appearing.     Comments: Fatigued, elderly female  HENT:     Head: Normocephalic and atraumatic.  Eyes:     Extraocular Movements: Extraocular movements intact.     Conjunctiva/sclera: Conjunctivae normal.     Pupils: Pupils are equal, round, and reactive to light.  Cardiovascular:     Rate and Rhythm: Rhythm irregular.     Pulses: Normal pulses.     Comments: Irregularly irregular rhythm Pulmonary:     Effort: Pulmonary effort is normal.  Breath sounds: Normal breath sounds. No wheezing, rhonchi or rales.  Abdominal:     Palpations: Abdomen is soft.     Tenderness: There is no abdominal tenderness. There is no guarding or rebound.  Musculoskeletal:     Cervical back: Normal range of motion and neck supple.  Skin:    General: Skin is warm and dry.  Neurological:     Mental Status: She is alert and oriented to person, place, and time.     GCS: GCS eye subscore is 4. GCS verbal subscore is 5. GCS motor subscore is 6.     Comments: Alert and oriented x 4. Following commands with some difficulty as pt appears very weak.  No CN deficits.  Strength 4/5 with grip strength on right. 3/5 on left.  Strength 4/5 to BLEs.  Sensation intact throughout.      ED Results / Procedures / Treatments   Labs (all labs ordered are listed, but only abnormal results are displayed) Labs Reviewed  COMPREHENSIVE METABOLIC PANEL - Abnormal; Notable for the following components:      Result Value   Sodium 131 (*)    Chloride 94 (*)    Glucose, Bld 132 (*)    All other components within normal limits  LACTIC ACID, PLASMA - Abnormal; Notable for the following components:   Lactic Acid, Venous 2.1 (*)    All other components within normal limits  CBC WITH DIFFERENTIAL/PLATELET - Abnormal; Notable for the following  components:   WBC 14.3 (*)    RDW 17.1 (*)    Neutro Abs 12.4 (*)    Lymphs Abs 0.5 (*)    Monocytes Absolute 1.2 (*)    Abs Immature Granulocytes 0.14 (*)    All other components within normal limits  URINALYSIS, ROUTINE W REFLEX MICROSCOPIC - Abnormal; Notable for the following components:   Ketones, ur 20 (*)    Protein, ur 100 (*)    All other components within normal limits  SARS CORONAVIRUS 2 BY RT PCR (HOSPITAL ORDER, [place])  CULTURE, BLOOD (ROUTINE X 2)  CULTURE, BLOOD (ROUTINE X 2)  CULTURE, BLOOD (SINGLE)  URINE CULTURE  CULTURE, BLOOD (SINGLE)  C DIFFICILE QUICK SCREEN W PCR REFLEX  PROTIME-INR  APTT  LACTIC ACID, PLASMA    EKG EKG Interpretation  Date/Time:  Monday March 21 2020 09:59:46 EDT Ventricular Rate:  79 PR Interval:    QRS Duration: 77 QT Interval:  374 QTC Calculation: 429 R Axis:   6 Text Interpretation: Atrial fibrillation Confirmed by Davonna Belling [id number]) on 03/21/2020 10:06:06 AM   Radiology DG Chest 2 View  Result Date: 03/21/2020 CLINICAL DATA:  Sepsis, weakness, headache, fever, cellulitis, nausea, pneumonia EXAM: CHEST - 2 VIEW COMPARISON:  03/14/2020 FINDINGS: Prior right upper lobe opacity is improved/resolved. No frank interstitial edema. Small bilateral pleural effusions, left greater than right. No pneumothorax. The heart is normal in size. IMPRESSION: Prior right upper lobe opacity is improved/resolved. Small bilateral pleural effusions, left greater than right. Electronically Signed   By: Julian Hy M.D.   On: 03/21/2020 09:45   CT Head Wo Contrast  Result Date: 03/21/2020 CLINICAL DATA:  Altered mental status, weakness EXAM: CT HEAD WITHOUT CONTRAST TECHNIQUE: Contiguous axial images were obtained from the base of the skull through the vertex without intravenous contrast. COMPARISON:  None. FINDINGS: Brain: No evidence of acute infarction, hemorrhage, extra-axial collection,  ventriculomegaly, or mass effect. Generalized cerebral atrophy. Periventricular white matter low attenuation likely secondary  to microangiopathy. Vascular: Cerebrovascular atherosclerotic calcifications are noted. Skull: Negative for fracture or focal lesion. Sinuses/Orbits: Visualized portions of the orbits are unremarkable. Visualized portions of the paranasal sinuses are unremarkable. Visualized portions of the mastoid air cells are unremarkable. Other: None. IMPRESSION: 1. No acute intracranial pathology. 2. Chronic microvascular disease and cerebral atrophy. Electronically Signed   By: Kathreen Devoid   On: 03/21/2020 11:32   UE VENOUS DUPLEX ([facility] & [facility] 7 am - 7 pm)  Result Date: 03/21/2020 UPPER VENOUS STUDY  Indications: Swelling Risk Factors: None identified. Limitations: Poor ultrasound/tissue interface and patient positioning. Comparison Study: No prior studies. Performing Technologist: Oliver Hum RVT  Examination Guidelines: A complete evaluation includes B-mode imaging, spectral Doppler, color Doppler, and power Doppler as needed of all accessible portions of each vessel. Bilateral testing is considered an integral part of a complete examination. Limited examinations for reoccurring indications may be performed as noted.  Right Findings: +----------+------------+---------+-----------+----------+-------+ RIGHT     CompressiblePhasicitySpontaneousPropertiesSummary +----------+------------+---------+-----------+----------+-------+ Subclavian    Full       Yes       Yes                      +----------+------------+---------+-----------+----------+-------+  Left Findings: +----------+------------+---------+-----------+----------+-----------------+ LEFT      CompressiblePhasicitySpontaneousProperties     Summary      +----------+------------+---------+-----------+----------+-----------------+ IJV           Full       Yes       Yes                                 +----------+------------+---------+-----------+----------+-----------------+ Subclavian    Full       Yes       Yes                                +----------+------------+---------+-----------+----------+-----------------+ Axillary      Full       Yes       Yes                                +----------+------------+---------+-----------+----------+-----------------+ Brachial      Full       Yes       Yes                                +----------+------------+---------+-----------+----------+-----------------+ Radial        Full                                                    +----------+------------+---------+-----------+----------+-----------------+ Ulnar         Full                                                    +----------+------------+---------+-----------+----------+-----------------+ Cephalic      None  Age Indeterminate +----------+------------+---------+-----------+----------+-----------------+ Basilic       Full                                                    +----------+------------+---------+-----------+----------+-----------------+  Summary:  Right: No evidence of thrombosis in the subclavian.  Left: No evidence of deep vein thrombosis in the upper extremity. Findings consistent with age indeterminate superficial vein thrombosis involving the left cephalic vein.  *See table(s) above for measurements and observations.  Diagnosing physician: Servando Snare MD Electronically signed by Servando Snare MD on 03/21/2020 at 1:01:01 PM.    Final     Procedures .Critical Care Performed by: Eustaquio Maize, PA-C Authorized by: Eustaquio Maize, PA-C   Critical care provider statement:    Critical care time (minutes):  50   Critical care was necessary to treat or prevent imminent or life-threatening deterioration of the following conditions:  Sepsis   Critical care was time spent personally by me on the following  activities:  Discussions with consultants, evaluation of patient's response to treatment, examination of patient, ordering and performing treatments and interventions, ordering and review of laboratory studies, ordering and review of radiographic studies, pulse oximetry, re-evaluation of patient's condition, obtaining history from patient or surrogate and review of old charts   (including critical care time)  Medications Ordered in ED Medications  vancomycin (VANCOCIN) IVPB 1000 mg/200 mL premix (has no administration in time range)  sodium chloride 0.9 % bolus 1,000 mL (has no administration in time range)    And  sodium chloride 0.9 % bolus 1,000 mL (has no administration in time range)  sodium chloride 0.9 % bolus 1,000 mL (0 mLs Intravenous Paused 03/21/20 1204)  acetaminophen (TYLENOL) tablet 650 mg (650 mg Oral Given 03/21/20 1052)  ceFEPIme (MAXIPIME) 2 g in sodium chloride 0.9 % 100 mL IVPB (0 g Intravenous Paused 03/21/20 1204)  metroNIDAZOLE (FLAGYL) IVPB 500 mg (0 mg Intravenous Paused 03/21/20 1204)    ED Course  I have reviewed the triage vital signs and the nursing notes.  Pertinent labs & imaging results that were available during my care of the patient were reviewed by me and considered in my medical decision making (see chart for details).  Clinical Course as of Mar 21 1328  Mon Mar 21, 2020  1100 WBC(!): 14.3 [MV]  1106 Temp(!): 101.1 F (38.4 C) [MV]  1141 Lactic Acid, Venous(!!): 2.1 [MV]    Clinical Course User Index [MV] Eustaquio Maize, PA-C   MDM Rules/Calculators/A&P                          86 year old female who presents to the ED today with daughter with multiple complaints.  Per triage report patient was daughter wanted her to have her left arm evaluated for cellulitis as she was recently discharged from the hospital and had IVs infiltrated on that arm.  However when patient is brought back to her room daughter states she is uncertain patient had a stroke as  she has been having a right-sided headache and increase in left arm weakness for the past 2 days, last known normal greater than 48 hours ago.  She is also concerned that her mother has C. difficile she has been having diarrhea however she did have constipation and was being treated with MiraLAX at home.  On arrival to the ED patient is febrile at 101.1.  Daughter states she noticed that she had a fever this morning.  Patient was recently discharged from the hospital secondary to in the right middle and upper lobe, being treated with 750 mg Levaquin at home.  Patient is unvaccinated against Covid.  She is not currently on anticoagulant, has history of DVT/PE however was taken off secondary to increased falls.  On exam patient appears very fatigued and weak.  She is alert and oriented x4 and does follow commands however does need to be prompted and redirected.  GCS 15.  She does have decreased grip strength in the left arm compared to right.  Her legs are equal bilaterally.  Plan for CT head for evaluation of concern for stroke as well as headache.  Blood pressure is elevated today at 185/154, patient did not take her atenolol this morning.  Question intracranial hemorrhage? Patient's fatigue may be related to fever of unknown origin.  We will plan for a chest x-ray to assess for worsening pneumonia, urinalysis, blood cultures, Covid test. Pt will need to be admitted.   Repeat BP 138/97 . Will hold on hydralazine.   CXR with improving/nearly resolved infiltrate.  DVT study negative however does show cephalic vein clot. Will treat for superficial thrombophlebitis.   WBC count has returned elevated at 14.3 with left shift; pt started on empiric abx at this time. Unknown source currently. If U/A without infection/C diff negative/COVID negative may need repeat CT chest to assess for recent PNA vs source of infection being cellulitis from the arm.   Lactic acid elevated 2.1. Additional fluids provided.  CMP with  sodium 131; chloride 94; glucose 132. No other electrolyte abnormalities.   CT Head negative U/A with ketones and protein; no infection.  COVID negative.   Nursing staff informed that pt's IV infiltrated. IV team at bedside placing additional line for abx and fluids. Suspect pt's source of fever and leukocytosis is coming from cellulitis from her LUE. She has not been able to provide stool sample at this time however I have lower suspicion for C diff - it appears pt was having constipation and then treated with miralax resulting in diarrhea. Given age and overall appearance of patient feel she requires admission at this time. Will consult hospitalist.   Discussed case with Dr. Francine Graven who will evaluate for admission.   This note was prepared using Dragon voice recognition software and may include unintentional dictation errors due to the inherent limitations of voice recognition software.  Final Clinical Impression(s) / ED Diagnoses Final diagnoses:  Sepsis without acute organ dysfunction, due to unspecified organism [facility])  Cellulitis of left upper extremity  Superficial thrombophlebitis of left upper extremity  Diarrhea, unspecified type    Rx / DC Orders ED Discharge Orders    None       Eustaquio Maize, PA-C 03/21/20 1345    Davonna Belling, MD 03/21/20 1530

## 2020-03-21 NOTE — ED Triage Notes (Signed)
Pt brought in by daughter. Daughter is concerned for cellulitis in pt's left arm. Pt had been in the hospital and had infilitrated IVs. Pt states that she has been very weak and not able to get around with a walker like normal. This has gotten worse since yesterday.

## 2020-03-21 NOTE — H&P (Signed)
History and Physical    Mary Rice OZH:[id number] DOB: 05/24/1933 DOA: 03/21/2020  PCP: Rusty Aus, MD   Patient coming from: Home  I have personally briefly reviewed patient's old medical records in [place]  Chief Complaint: Fever                                Change in mental status area   Most of the history was obtained from patient's daughter who was at the bedside  HPI: Mary Rice is a 86 y.o. female with medical history significant for  DVT/PE and not on anticoagulation, hypertension, dyslipidemia, and A. fib who presents to the emergency room for evaluation of swelling and pain involving her left arm as well as mental status changes  Patient was recently discharged from the hospital on 03/15/20 following treatment for pneumonia and was discharged home on Levaquin 750 mg daily for 5 days.  Patient's daughter states that her IV had infiltrated during that hospital stay and following her discharge she has had pain and swelling in her left arm.  She also stated that her mother had become increasingly weak and unable to get around with her walker.  She notes some left-sided weakness Upon arrival to the ER to have a fever with a T-max of 101.19F, blood pressure was 138/95, pulse oximetry 96% on room air Labs show sodium 131, potassium 3.7, chloride 94, bicarb 25, BUN 15, creatinine 0.65, calcium 8.9, AST 19, ALT 16, lactic acid 2.1, white count 14.3 with a left shift, hemoglobin 14.7, hematocrit 44.3, MCV 89.3, RDW 17.1, platelet count 225 UA was sterile CT scan of the head without contrast showed no acute intracranial pathology.  Chronic microvascular disease and cerebral atrophy. Left upper extremity ultrasound showed no evidence of DVT in upper extremity findings consistent with age-indeterminate superficial vein thrombosis involving the left cephalic vein Twelve-lead EKG reviewed by me shows atrial fibrillation    ED Course: Patient is an 86 year old female who was  recently discharged from the hospital following treatment for community-acquired pneumonia and who presents to the ER today for evaluation of fever, left upper extremity swelling and pain as well as generalized weakness.  Venous Doppler shows superficial thrombophlebitis involving the left cephalic vein.  Patient had a T-max of 101 and has leukocytosis.  She will be admitted to the hospital for further evaluation  Review of Systems: As per HPI otherwise 10 point review of systems negative.    Past Medical History:  Diagnosis Date  . DVT (deep venous thrombosis) ([place])   . Dysrhythmia   . Hiatal hernia   . Hyperlipemia   . Hypertension   . Pulmonary embolism [facility])     Past Surgical History:  Procedure Laterality Date  . ABDOMINAL HYSTERECTOMY    . CATARACT EXTRACTION    . CHOLECYSTECTOMY    . ESOPHAGOGASTRODUODENOSCOPY N/A 08/19/2015   Procedure: ESOPHAGOGASTRODUODENOSCOPY (EGD);  Surgeon: Mauri Pole, MD;  Location: [facility] ENDOSCOPY;  Service: Endoscopy;  Laterality: N/A;  . KYPHOPLASTY N/A 02/26/2020   Procedure: KYPHOPLASTY T11;  Surgeon: Hessie Knows, MD;  Location: [facility] ORS;  Service: Orthopedics;  Laterality: N/A;     reports that she has never smoked. She has never used smokeless tobacco. She reports that she does not drink alcohol and does not use drugs.  Allergies  Allergen Reactions  . Biaxin [Clarithromycin]     Reaction unknown  . Maxzide [Hydrochlorothiazide W-Triamterene]  Reaction unknown  . Penicillins Rash  . Sulfa Antibiotics Rash    Family History  Problem Relation Age of Onset  . Heart failure Mother      Prior to Admission medications   Medication Sig Start Date End Date Taking? Authorizing Provider  acetaminophen (TYLENOL) 650 MG CR tablet Take 1,300 mg by mouth every 8 (eight) hours as needed for pain.   Yes [provider]  Ascorbic Acid (VITAMIN C) 1000 MG tablet Take 1,000 mg by mouth daily.   Yes [provider]  aspirin  EC 325 MG tablet Take 325 mg by mouth daily.   Yes [provider]  atenolol (TENORMIN) 25 MG tablet Take 25 mg by mouth daily. 08/09/15  Yes [provider]  Calcium Carb-Cholecalciferol (CALCIUM 600 + D PO) Take 1 tablet by mouth daily.   Yes [provider]  calcium carbonate (TUMS - DOSED IN MG ELEMENTAL CALCIUM) 500 MG chewable tablet Chew 1 tablet by mouth daily as needed for indigestion or heartburn.   Yes [provider]  Cholecalciferol 50 MCG (2000 UT) TABS Take 2,000 Units by mouth daily.   Yes [provider]  docusate sodium (COLACE) 100 MG capsule Take 100 mg by mouth at bedtime.   Yes [provider]  levofloxacin (LEVAQUIN) 750 MG tablet Take 750 mg by mouth daily. x5days started 9/7   Yes [provider]  Multiple Vitamin (MULTIVITAMIN WITH MINERALS) TABS tablet Take 1 tablet by mouth daily.   Yes [provider]  pantoprazole (PROTONIX) 40 MG tablet Take 40 mg by mouth daily.   Yes [provider]  polyethylene glycol (MIRALAX / GLYCOLAX) 17 g packet Take 17 g by mouth daily as needed for mild constipation.   Yes [provider]  pravastatin (PRAVACHOL) 20 MG tablet Take 20 mg by mouth at bedtime.    Yes [provider]  sodium chloride 1 g tablet Take 1 g by mouth daily.   Yes [provider]  traMADol (ULTRAM) 50 MG tablet Take 1 tablet (50 mg total) by mouth daily as needed for moderate pain. Patient not taking: Reported on 03/21/2020 02/26/20   Hessie Knows, MD    Physical Exam: Vitals:   03/21/20 1443 03/21/20 1500 03/21/20 1543 03/21/20 1633  BP: (!) 138/106 (!) 151/117  92/67  Pulse: 80   72  Resp: (!) 22 (!) 23  15  Temp:   (!) 100.4 F (38 C) 100.2 F (37.9 C)  TempSrc:   Oral Oral  SpO2: 98%   97%  Weight:  89.8 kg    Height:  5\' 8"  (1.727 m)       Vitals:   03/21/20 1443 03/21/20 1500 03/21/20 1543 03/21/20 1633  BP: (!) 138/106 (!) 151/117  92/67    Pulse: 80   72  Resp: (!) 22 (!) 23  15  Temp:   (!) 100.4 F (38 C) 100.2 F (37.9 C)  TempSrc:   Oral Oral  SpO2: 98%   97%  Weight:  89.8 kg    Height:  5\' 8"  (1.727 m)      Constitutional: NAD, lethargic and arouses easily.  Slow to respond which daughter states is not her baseline Eyes: PERRL, lids and conjunctivae normal ENMT: Mucous membranes are moist.  Neck: normal, supple, no masses, no thyromegaly Respiratory: clear to auscultation bilaterally, no wheezing, no crackles. Normal respiratory effort. No accessory muscle use.  Cardiovascular: Regular rate and rhythm, no murmurs / rubs /  gallops. No extremity edema. 2+ pedal pulses. No carotid bruits.  Abdomen: no tenderness, no masses palpated. No hepatosplenomegaly. Bowel sounds positive.  Musculoskeletal: no clubbing / cyanosis. No joint deformity upper and lower extremities.  Skin: no rashes, lesions, ulcers.  Neurologic: Generalized weakness Psychiatric: Unable to assess   Labs on Admission: I have personally reviewed following labs and imaging studies  CBC: Recent Labs  Lab 03/15/20 0443 03/21/20 1037  WBC 8.2 14.3*  NEUTROABS  --  12.4*  HGB 13.0 14.7  HCT 37.6 44.3  MCV 86.2 89.3  PLT 217 283   Basic Metabolic Panel: Recent Labs  Lab 03/15/20 0443 03/21/20 1037  NA 134* 131*  K 3.6 3.7  CL 100 94*  CO2 26 25  GLUCOSE 128* 132*  BUN 14 15  CREATININE 0.66 0.65  CALCIUM 8.5* 8.9   GFR: Estimated Creatinine Clearance: 60.3 mL/min (by C-G formula based on SCr of 0.65 mg/dL). Liver Function Tests: Recent Labs  Lab 03/21/20 1037  AST 19  ALT 16  ALKPHOS 65  BILITOT 0.9  PROT 6.6  ALBUMIN 3.8   No results for input(s): LIPASE, AMYLASE in the last 168 hours. No results for input(s): AMMONIA in the last 168 hours. Coagulation Profile: Recent Labs  Lab 03/21/20 1037  INR 1.1   Cardiac Enzymes: No results for input(s): CKTOTAL, CKMB, CKMBINDEX, TROPONINI in the last 168 hours. BNP (last 3  results) No results for input(s): PROBNP in the last 8760 hours. HbA1C: No results for input(s): HGBA1C in the last 72 hours. CBG: No results for input(s): GLUCAP in the last 168 hours. Lipid Profile: No results for input(s): CHOL, HDL, LDLCALC, TRIG, CHOLHDL, LDLDIRECT in the last 72 hours. Thyroid Function Tests: No results for input(s): TSH, T4TOTAL, FREET4, T3FREE, THYROIDAB in the last 72 hours. Anemia Panel: No results for input(s): VITAMINB12, FOLATE, FERRITIN, TIBC, IRON, RETICCTPCT in the last 72 hours. Urine analysis:    Component Value Date/Time   COLORURINE YELLOW 03/21/2020 1254   APPEARANCEUR CLEAR 03/21/2020 1254   LABSPEC 1.020 03/21/2020 1254   PHURINE 6.0 03/21/2020 1254   GLUCOSEU NEGATIVE 03/21/2020 1254   HGBUR NEGATIVE 03/21/2020 1254   BILIRUBINUR NEGATIVE 03/21/2020 1254   KETONESUR 20 (A) 03/21/2020 1254   PROTEINUR 100 (A) 03/21/2020 1254   NITRITE NEGATIVE 03/21/2020 1254   LEUKOCYTESUR NEGATIVE 03/21/2020 1254    Radiological Exams on Admission: DG Chest 2 View  Result Date: 03/21/2020 CLINICAL DATA:  Sepsis, weakness, headache, fever, cellulitis, nausea, pneumonia EXAM: CHEST - 2 VIEW COMPARISON:  03/14/2020 FINDINGS: Prior right upper lobe opacity is improved/resolved. No frank interstitial edema. Small bilateral pleural effusions, left greater than right. No pneumothorax. The heart is normal in size. IMPRESSION: Prior right upper lobe opacity is improved/resolved. Small bilateral pleural effusions, left greater than right. Electronically Signed   By: Julian Hy M.D.   On: 03/21/2020 09:45   CT Head Wo Contrast  Result Date: 03/21/2020 CLINICAL DATA:  Altered mental status, weakness EXAM: CT HEAD WITHOUT CONTRAST TECHNIQUE: Contiguous axial images were obtained from the base of the skull through the vertex without intravenous contrast. COMPARISON:  None. FINDINGS: Brain: No evidence of acute infarction, hemorrhage, extra-axial collection,  ventriculomegaly, or mass effect. Generalized cerebral atrophy. Periventricular white matter low attenuation likely secondary to microangiopathy. Vascular: Cerebrovascular atherosclerotic calcifications are noted. Skull: Negative for fracture or focal lesion. Sinuses/Orbits: Visualized portions of the orbits are unremarkable. Visualized portions of the paranasal sinuses are unremarkable. Visualized portions of the mastoid air cells  are unremarkable. Other: None. IMPRESSION: 1. No acute intracranial pathology. 2. Chronic microvascular disease and cerebral atrophy. Electronically Signed   By: Kathreen Devoid   On: 03/21/2020 11:32   UE VENOUS DUPLEX ([facility] & [facility] 7 am - 7 pm)  Result Date: 03/21/2020 UPPER VENOUS STUDY  Indications: Swelling Risk Factors: None identified. Limitations: Poor ultrasound/tissue interface and patient positioning. Comparison Study: No prior studies. Performing Technologist: Oliver Hum RVT  Examination Guidelines: A complete evaluation includes B-mode imaging, spectral Doppler, color Doppler, and power Doppler as needed of all accessible portions of each vessel. Bilateral testing is considered an integral part of a complete examination. Limited examinations for reoccurring indications may be performed as noted.  Right Findings: +----------+------------+---------+-----------+----------+-------+ RIGHT     CompressiblePhasicitySpontaneousPropertiesSummary +----------+------------+---------+-----------+----------+-------+ Subclavian    Full       Yes       Yes                      +----------+------------+---------+-----------+----------+-------+  Left Findings: +----------+------------+---------+-----------+----------+-----------------+ LEFT      CompressiblePhasicitySpontaneousProperties     Summary      +----------+------------+---------+-----------+----------+-----------------+ IJV           Full       Yes       Yes                                 +----------+------------+---------+-----------+----------+-----------------+ Subclavian    Full       Yes       Yes                                +----------+------------+---------+-----------+----------+-----------------+ Axillary      Full       Yes       Yes                                +----------+------------+---------+-----------+----------+-----------------+ Brachial      Full       Yes       Yes                                +----------+------------+---------+-----------+----------+-----------------+ Radial        Full                                                    +----------+------------+---------+-----------+----------+-----------------+ Ulnar         Full                                                    +----------+------------+---------+-----------+----------+-----------------+ Cephalic      None                                  Age Indeterminate +----------+------------+---------+-----------+----------+-----------------+ Basilic       Full                                                    +----------+------------+---------+-----------+----------+-----------------+  Summary:  Right: No evidence of thrombosis in the subclavian.  Left: No evidence of deep vein thrombosis in the upper extremity. Findings consistent with age indeterminate superficial vein thrombosis involving the left cephalic vein.  *See table(s) above for measurements and observations.  Diagnosing physician: Servando Snare MD Electronically signed by Servando Snare MD on 03/21/2020 at 1:01:01 PM.    Final     EKG: Independently reviewed.  Atrial fibrillation   Assessment/Plan Principal Problem:   AMS (altered mental status) Active Problems:   Atrial fibrillation (HCC)   Essential hypertension   Fever   Generalized weakness   Thrombophlebitis arm     Altered mental status Unclear etiology Patient is very lethargic but arouses to verbal stimuli and is slow to  respond Family states this is a change from her baseline Daughter also notes some left-sided weakness Initial CT scan of the head done without contrast is negative for bleed Patient has a history of A. fib which increases her risk for having a stroke We will obtain MRI of the brain for further evaluation We will request speech therapy consult for swallow function evaluation Keep patient n.p.o. Administer aspirin per rectum Continue pravastatin    Fever Most likely related to superficial thrombophlebitis in the left upper extremity Keep left upper extremity elevated Supportive care with pain control No obvious source of infection at this time and patient recently completed antibiotic therapy for pneumonia She received broad-spectrum antibiotics with cefepime, vancomycin and Flagyl in the ER for ??  Cellulitis involving the left upper extremity We will hold off on further antibiotic administration for now   History of A. Fib Rate controlled on beta-blockers Patient not on long-term anticoagulation due to increased risk for falls    DVT prophylaxis: Lovenox Code Status: Full code Family Communication: Greater than 50% of time was spent discussing patient's condition and plan of care with her daughter at the bedside. Disposition Plan: Back to previous home environment Consults called: PT    Alyha Marines MD [facility]     03/21/2020, 4:53 PM

## 2020-03-21 NOTE — Progress Notes (Signed)
Called on behalf of receiving nurse for report. This RN was placed on hold and could not get in touch with the ED RN.

## 2020-03-21 NOTE — Progress Notes (Addendum)
Patient has been unable to void, she was bladder scanned, 386 ml of urine. NP Sharlet Salina notified.

## 2020-03-21 NOTE — Progress Notes (Signed)
Left upper extremity venous duplex has been completed. Preliminary results can be found in CV Proc through chart review.  Results were given to [facility] PA.  03/21/20 11:01 AM Carlos Levering RVT

## 2020-03-22 LAB — CBC
HCT: 36 % (ref 36.0–46.0)
Hemoglobin: 12 g/dL (ref 12.0–15.0)
MCH: 29.8 pg (ref 26.0–34.0)
MCHC: 33.3 g/dL (ref 30.0–36.0)
MCV: 89.3 fL (ref 80.0–100.0)
Platelets: 166 10*3/uL (ref 150–400)
RBC: 4.03 MIL/uL (ref 3.87–5.11)
RDW: 16.8 % — ABNORMAL HIGH (ref 11.5–15.5)
WBC: 8.8 10*3/uL (ref 4.0–10.5)
nRBC: 0 % (ref 0.0–0.2)

## 2020-03-22 LAB — BASIC METABOLIC PANEL
Anion gap: 10 (ref 5–15)
BUN: 14 mg/dL (ref 8–23)
CO2: 21 mmol/L — ABNORMAL LOW (ref 22–32)
Calcium: 7.8 mg/dL — ABNORMAL LOW (ref 8.9–10.3)
Chloride: 96 mmol/L — ABNORMAL LOW (ref 98–111)
Creatinine, Ser: 0.55 mg/dL (ref 0.44–1.00)
GFR calc Af Amer: >60 mL/min (ref >60)
GFR calc non Af Amer: >60 mL/min (ref >60)
Glucose, Bld: 102 mg/dL — ABNORMAL HIGH (ref 70–99)
Potassium: 3.3 mmol/L — ABNORMAL LOW (ref 3.5–5.1)
Sodium: 127 mmol/L — ABNORMAL LOW (ref 135–145)

## 2020-03-22 LAB — GLUCOSE, CAPILLARY
Glucose-Capillary: 109 mg/dL — ABNORMAL HIGH (ref 70–99)
Glucose-Capillary: 109 mg/dL — ABNORMAL HIGH (ref 70–99)
Glucose-Capillary: 99 mg/dL (ref 70–99)
Glucose-Capillary: 103 mg/dL — ABNORMAL HIGH (ref 70–99)
Glucose-Capillary: 94 mg/dL (ref 70–99)
Glucose-Capillary: 110 mg/dL — ABNORMAL HIGH (ref 70–99)
Glucose-Capillary: 102 mg/dL — ABNORMAL HIGH (ref 70–99)

## 2020-03-22 LAB — URINE CULTURE: Culture: NO GROWTH

## 2020-03-22 MED ORDER — MIRABEGRON ER 25 MG PO TB24
25.0000 mg | ORAL_TABLET | Freq: Every day | ORAL | Status: DC
  Administered 2020-03-23: 25 mg via ORAL
  Filled 2020-03-22 (×2): qty 1

## 2020-03-22 MED ORDER — POTASSIUM CHLORIDE 2 MEQ/ML IV SOLN
INTRAVENOUS | Status: DC
  Filled 2020-03-22 (×2): qty 1000

## 2020-03-22 MED ORDER — CHLORHEXIDINE GLUCONATE CLOTH 2 % EX PADS
6.0000 | MEDICATED_PAD | Freq: Every day | CUTANEOUS | Status: DC
  Administered 2020-03-22 – 2020-03-23 (×2): 6 via TOPICAL

## 2020-03-22 MED ORDER — POTASSIUM CHLORIDE 2 MEQ/ML IV SOLN: INTRAVENOUS | Status: DC

## 2020-03-22 MED ORDER — KCL-LACTATED RINGERS 20 MEQ/L IV SOLN
INTRAVENOUS | Status: DC
  Filled 2020-03-22 (×2): qty 1000

## 2020-03-22 NOTE — Progress Notes (Signed)
Patient was in & out cath, 425 mls was emptied from bladder.

## 2020-03-22 NOTE — Evaluation (Signed)
Clinical/Bedside Swallow Evaluation Patient Details  Name: Mary Rice MRN: [id number] Date of Birth: 07/22/1932  Today's Date: 03/22/2020 Time: SLP Start Time (ACUTE ONLY): 84 SLP Stop Time (ACUTE ONLY): 1320 SLP Time Calculation (min) (ACUTE ONLY): 50 min  Past Medical History:  Past Medical History:  Diagnosis Date  . DVT (deep venous thrombosis) ([place])   . Dysrhythmia   . Hiatal hernia   . Hyperlipemia   . Hypertension   . Pulmonary embolism [facility])    Past Surgical History:  Past Surgical History:  Procedure Laterality Date  . ABDOMINAL HYSTERECTOMY    . CATARACT EXTRACTION    . CHOLECYSTECTOMY    . ESOPHAGOGASTRODUODENOSCOPY N/A 08/19/2015   Procedure: ESOPHAGOGASTRODUODENOSCOPY (EGD);  Surgeon: Mauri Pole, MD;  Location: [facility] ENDOSCOPY;  Service: Endoscopy;  Laterality: N/A;  . KYPHOPLASTY N/A 02/26/2020   Procedure: KYPHOPLASTY T11;  Surgeon: Hessie Knows, MD;  Location: [facility] ORS;  Service: Orthopedics;  Laterality: N/A;   HPI:  Pt is a 86 y.o. female with a history of provoked DVT/PE and not on anticoagulation, hypertension, Reflux, Hiatal Hernia, hyperlipidemia, A. fib, recent admit for pna now admitted with left arm edema and discomfort.  Decline in function per daughter in law since she had COVID last year - per notes progressive recently.  She had a fall which resulted in thoracic compression fracture for which she underwent kyphoplasty approx one month ago by Dr. Rudene Christians.  Pt underwent BSE during prior admission with findings of adequate oropharyngeal swallow.  Swallow eval ordered with this admission. Pt know to have h/o dysphagia with sensation of food lodging in esophagus and location of EG esophageal tear by Dr Silverio Decamp upon endoscopy per notes 08/19/2015.  It was advised for pt to follow up with GI in approx 4-6 weeks to determine if dilatation indicated.  From chart review, uncertain if pt had repeat endoscopy however no report of severe dysphagia since this  occurence.  MRI brain negative for acute change and indication was for left sided weakness.   Assessment / Plan / Recommendation Clinical Impression  Pt today again appears with adequate oropharyngeal swallow ability.  No evidence of aspiration/penetration observed with all intake consumed including 3 ounces water, 1 ounce applesauce, 1 graham cracker and 1 cheese bolus.  Pt was nodding off during session needing moderate verbal and tactile cues to stay alert past approx 30 seconds at a time after approx 15 minutes.  Pt sleepy even when trying to masticate and does appear with mildly increased work of breathing with effort of even self feeding and swallowing. Given her h/o reflux, esophageal dysphagia to solids and esophageal hiatal hernia, recommend strict esophageal/reflux precautions.  Note pt's oral motor exam was unremarkable without left facial/labial/lingual weakness however she has left hand gripping and possibly left foot weakness.  Recommend continue soft/thin diet.     Using teach back, educated daughter in law to findings, recommendations, compensations for esophageal deficits -  as pt fell asleep. Due to pt's mentation, minimal dyspnea with intake - will follow up x1 to assure tolerance and for education. SLP Visit Diagnosis: Dysphagia, unspecified (R13.10) (gi discomfort w/ N/V)    Aspiration Risk   (reduced )    Diet Recommendation Dysphagia 3 (Mech soft);Thin liquid   Medication Administration: Whole meds with puree (as needed for ease of swallowing; chewable or liquid forms) Supervision: Staff to assist with self feeding Compensations: Slow rate;Small sips/bites Postural Changes: Remain upright for at least 30 minutes after po intake  Other  Recommendations Recommended Consults: Consider GI evaluation;Consider esophageal assessment (as needed; PPI. Dietician f/u as needed) Oral Care Recommendations: Oral care BID;Oral care before and after PO;Staff/trained caregiver to provide  oral care Other Recommendations:  (n/a)   Follow up Recommendations None      Frequency and Duration min 1 x/week (n/a)  1 week (n/a)       Prognosis Prognosis for Safe Diet Advancement: Good Barriers to Reach Goals: Other (Comment) (baseline Esophageal dysmotility; Hiatal Hernia)      Swallow Study   General HPI: Pt is a 86 y.o. female with a history of provoked DVT/PE and not on anticoagulation, hypertension, Reflux, Hiatal Hernia, hyperlipidemia, A. fib, recent admit for pna now admitted with left arm edema and discomfort.  Decline in function per daughter in law since she had COVID last year - per notes progressive recently.  She had a fall which resulted in thoracic compression fracture for which she underwent kyphoplasty approx one month ago by Dr. Rudene Christians.  Pt underwent BSE during prior admission with findings of adequate oropharyngeal swallow.  Swallow eval ordered with this admission. Pt know to have h/o dysphagia with sensation of food lodging in esophagus and location of EG esophageal tear by Dr Silverio Decamp upon endoscopy per notes 08/19/2015.  It was advised for pt to follow up with GI in approx 4-6 weeks to determine if dilatation indicated.  From chart review, uncertain if pt had repeat endoscopy however no report of severe dysphagia since this occurence.  MRI brain negative for acute change and indication was for left sided weakness. Type of Study: Bedside Swallow Evaluation Previous Swallow Assessment: prior BSE 1 week ago Diet Prior to this Study: Thin liquids;Dysphagia 3 (soft) Temperature Spikes Noted: No Respiratory Status: Nasal cannula History of Recent Intubation: No Behavior/Cognition: Alert;Cooperative;Pleasant mood;Distractible;Requires cueing Oral Cavity Assessment: Within Functional Limits Oral Care Completed by SLP: Yes Oral Cavity - Dentition: Adequate natural dentition (partial, denture) Vision: Functional for self-feeding Self-Feeding Abilities: Needs assist (pt  with weakness (left more than right) and needs assistance) Patient Positioning: Upright in bed Baseline Vocal Quality: Normal Volitional Cough: Other (Comment) (not tested) Volitional Swallow: Unable to elicit    Oral/Motor/Sensory Function Overall Oral Motor/Sensory Function: Other (comment) (pt tends to look to the right, does not turn head left, ? right gaze preference - per daughter son states ongoing x 5 YEARS)   Ice Chips     Thin Liquid Thin Liquid: Within functional limits Presentation: Self Fed;Straw (~10+ trials) Other Comments: Did not test cup as pt knocked cup over with her left hand and daughter in law stated pt only drinks from a straw    Nectar Thick Nectar Thick Liquid: Not tested   Honey Thick Honey Thick Liquid: Not tested   Puree Puree: Within functional limits Presentation: Self Fed;Spoon   Solid     Solid: Within functional limits Presentation: Self Fed;Spoon      Macario Golds 03/22/2020,2:05 PM  Kathleen Lime, MS [place] Office [phone]

## 2020-03-22 NOTE — Progress Notes (Signed)
PROGRESS NOTE    Mary Rice  QHU:[id number] DOB: 05/08/1933 DOA: 03/21/2020 PCP: Rusty Aus, MD  Brief Narrative:  86 year old community dwelling fem Bilateral pulmonary emboli 08/18/2005 previously on anticoagulation-now on aspirin EGD 08/18/2025 seen for globus sensation/choking Paroxysmal atrial fibrillation diagnosed sometime in 2017 was recommended Eliquis Multiple orthopedic surgeries Hyperlipidemia Prior episodes of syncope and collapse 03/2019 evaluated at [facility] ED and eventually was placed on aspirin because of these falls Prior compression fracture T11 vertebrae status post vertebroplasty 02/26/2020 Dr. Hessie Knows  Patient was admitted 9/6 through 03/15/2020 secondary to altered mental status confusion and combativeness which is not typical per family although prior notes reveal some element of confusion previously Patient found to have right sided pneumonia?  Aspiration Rx cefepime vancomycin Flagyl and was evaluated for delirium--at that admission speech therapy saw the patient-patient consumed all consistencies she seemed to be within functional limits and was cleared for regular diet Patient was seen by PCP 03/17/2020 by PCP and it was not thought based on complaints below that she had cellulitis Patient returned to [facility] long ED 9/13 arm pain right-sided headache and weakness of upper extremities as well as a fever -Additionally was given MiraLAX for constipation and now is having copious diarrhea Because of above constellation and generalized weakness and toxic metabolic encephalopathy patient was admitted Labs showed BUN/creatinine 15/0.6 normal LFTs Lactic acid 2.1 White count 14 CT head negative left upper extremity no DVT upper extremity EKG showed A. fib    Assessment & Plan:   Principal Problem:   AMS (altered mental status) Active Problems:   Atrial fibrillation (HCC)   Essential hypertension   Fever   Generalized weakness   Thrombophlebitis  arm   1. Toxic metabolic encephalopathy superimposed on probable dementia with contribution of delirium-rule out stroke with MRI a. MRI shows microvascular chronic changes with no acute stroke b. She was confused over the course of the past several days per daughter Janace Hoard who is a Marine scientist and who helps manage her at home and there was concern for fever which is why she was brought here c. I doubt meningitis-question has been raised whether this is C. difficile colitis but in the setting of recent use of MiraLAX and no further stool since admission this is doubted and I will discontinue precautions d. Antibiotics have been discontinued while we await and monitor urine and blood cultures from admission 9/13 e. She had a kyphoplasty on 02/26/2020 and daughter is concerned about patient having an infection related to this i. If she spikes another fever above 100.5 we can repeat cultures and consider imaging the T11 area with either an MRI or CT but so far she has been afebrile and her white count is not elevated today compared to yesterday ii. I do not suspect discitis or postoperative infection and we will ask therapy to work with the patient when she is more awake 2. Fever with no signs of sepsis recent Rx pneumonia IV antibiotics discharged on Levaquin-DDx superficial phlebitis left upper extremity DVT ruled out a. No further antibiotics as per above discussion 3. Underlying delirium and dementia a. Daughter admits patient does sundown at times b. She may have dementia but is able to verbalize that she is at [facility] however history is limited and she closes her eyes and does not interact fully with me today 4. Bilateral pulmonary emboli 2007 a. Not a candidate for anticoagulation secondary to falls debility 5. Permanent A. fib CHA2DS2-VASc >4 not anticoagulation candidate secondary  to falls and high risk of bleed a. Continue atenolol 25 daily b. Discontinue monitors given A. fib with less than  3-second pauses and no other issues at this time 6. Probable hypovolemic hyponatremia a. Secondary to excessive drinking/potomania b. We will fluid restrict 1500 cc in place of board on the door-if this does not improve please obtain urine sodium and urine osmolality in the morning 7. Hypokalemia a. Replace potassium with IV LR 20 of K 75 cc/h b. Repeat a.m. labs with magnesium and replace if needed 8. New onset urinary retention a. Daughter states has ability to pass urine usually at home but since this admission has not passed urine b. She will need bladder scan c. We will place mybetriq to see if this helps d. If she does not void she may require Foley catheter and I discussed this with daughter 36. ?  Impaired glycemic control without diagnosis diabetes mellitus 10. Prior esophageal obstruction follows Dr. Silverio Decamp a. Stable at this time monitor b. Speech therapy engaged to revisit the patient however she was on a regular diet on last admission in addition    DVT prophylaxis: Prophylactic Lovenox Code Status: Full code Family Communication: Discussed with daughter in detail on telephone Angie Johnson Disposition:   Status is: Inpatient  Remains inpatient appropriate because:Persistent severe electrolyte disturbances, Ongoing active pain requiring inpatient pain management, Altered mental status and Ongoing diagnostic testing needed not appropriate for outpatient work up   Dispo: The patient is from: Home              Anticipated d/c is to: SNF              Anticipated d/c date is: 3 days              Patient currently is not medically stable to d/c.       Consultants:   None at this time  Procedures: None  Antimicrobials: None   Subjective: Minimally arousable opens eyes tells me she is in [place] long and then closes her eyes Does have some mild abdominal discomfort when I press on Otherwise history is given by her friend at the bedside and her daughter on the  phone  Objective: Vitals:   03/21/20 2230 03/21/20 2341 03/22/20 0349 03/22/20 0733  BP: (!) 156/76 (!) 164/59 136/62 126/69  Pulse: 70 73 85 (!) 47  Resp: 20 (!) 22 18 20   Temp: 100.2 F (37.9 C) (!) 100.5 F (38.1 C) 99.6 F (37.6 C) 99.7 F (37.6 C)  TempSrc: Oral Oral Oral Oral  SpO2: 99% 97% 100% 93%  Weight:      Height:        Intake/Output Summary (Last 24 hours) at 03/22/2020 5462 Last data filed at 03/22/2020 0600 Gross per 24 hour  Intake 408.25 ml  Output 425 ml  Net -16.75 ml   Filed Weights   03/21/20 1048 03/21/20 1500  Weight: 89.8 kg 89.8 kg    Examination:  General exam: Obese alert oriented no distress quite sleepy Respiratory system: Clear no added sound rales rhonchi Cardiovascular system: S1-S2 A. fib on monitors less than 3-second pauses holosystolic murmur Gastrointestinal system: Slight tenderness lower quadrant?  Bladder distention No rebound no guarding. Central nervous system: Neurologically intact no focal deficit moving 4 limbs without deficit History limited and physical exam limited by patient cooperation cannot assess finger-nose-finger test Extremities: Joints are intact without any new swellings Skin: mild LE swelling Psychiatry: Sleepy and cannot completely assess  Data  Reviewed: I have personally reviewed following labs and imaging studies Sodium baseline 136-->127 Potassium 3.3 CO2 21 BUNs/creatinine 14/0.5 Calcium 7.8 WBC 14.3-->8.8 Hemoglobin 14.7-->12.0 Platelet 225-->166  Radiology Studies: DG Chest 2 View  Result Date: 03/21/2020 CLINICAL DATA:  Sepsis, weakness, headache, fever, cellulitis, nausea, pneumonia EXAM: CHEST - 2 VIEW COMPARISON:  03/14/2020 FINDINGS: Prior right upper lobe opacity is improved/resolved. No frank interstitial edema. Small bilateral pleural effusions, left greater than right. No pneumothorax. The heart is normal in size. IMPRESSION: Prior right upper lobe opacity is improved/resolved. Small  bilateral pleural effusions, left greater than right. Electronically Signed   By: Julian Hy M.D.   On: 03/21/2020 09:45   CT Head Wo Contrast  Result Date: 03/21/2020 CLINICAL DATA:  Altered mental status, weakness EXAM: CT HEAD WITHOUT CONTRAST TECHNIQUE: Contiguous axial images were obtained from the base of the skull through the vertex without intravenous contrast. COMPARISON:  None. FINDINGS: Brain: No evidence of acute infarction, hemorrhage, extra-axial collection, ventriculomegaly, or mass effect. Generalized cerebral atrophy. Periventricular white matter low attenuation likely secondary to microangiopathy. Vascular: Cerebrovascular atherosclerotic calcifications are noted. Skull: Negative for fracture or focal lesion. Sinuses/Orbits: Visualized portions of the orbits are unremarkable. Visualized portions of the paranasal sinuses are unremarkable. Visualized portions of the mastoid air cells are unremarkable. Other: None. IMPRESSION: 1. No acute intracranial pathology. 2. Chronic microvascular disease and cerebral atrophy. Electronically Signed   By: Kathreen Devoid   On: 03/21/2020 11:32   MR BRAIN WO CONTRAST  Result Date: 03/21/2020 CLINICAL DATA:  86 year old female with altered mental status, weakness. Left side weakness. Confusion. EXAM: MRI HEAD WITHOUT CONTRAST TECHNIQUE: Multiplanar, multiecho pulse sequences of the brain and surrounding structures were obtained without intravenous contrast. COMPARISON:  Head CT earlier today, and 03/13/2020. FINDINGS: Brain: No restricted diffusion to suggest acute infarction. No midline shift, mass effect, evidence of mass lesion, ventriculomegaly, extra-axial collection or acute intracranial hemorrhage. Cervicomedullary junction and pituitary are within normal limits. Patchy and confluent bilateral cerebral white matter T2 and FLAIR hyperintensity, relatively sparing the deep white matter capsules. No superimposed cortical encephalomalacia or definite  chronic cerebral blood products. The deep gray matter nuclei, brainstem and cerebellum are within normal limits for age. Vascular: Major intracranial vascular flow voids are preserved. Skull and upper cervical spine: Mild for age visible cervical spine degeneration. Visualized bone marrow signal is within normal limits. Sinuses/Orbits: Postoperative changes to both globes, otherwise negative orbits. Paranasal sinuses and mastoids are stable and well pneumatized. Other: Visible internal auditory structures appear normal. Scalp and face appear negative. IMPRESSION: 1. No acute intracranial abnormality. 2. Moderately advanced nonspecific cerebral white matter signal changes, most commonly due to chronic small vessel disease. Electronically Signed   By: Genevie Ann M.D.   On: 03/21/2020 19:07   UE VENOUS DUPLEX ([facility] & [facility] 7 am - 7 pm)  Result Date: 03/21/2020 UPPER VENOUS STUDY  Indications: Swelling Risk Factors: None identified. Limitations: Poor ultrasound/tissue interface and patient positioning. Comparison Study: No prior studies. Performing Technologist: Oliver Hum RVT  Examination Guidelines: A complete evaluation includes B-mode imaging, spectral Doppler, color Doppler, and power Doppler as needed of all accessible portions of each vessel. Bilateral testing is considered an integral part of a complete examination. Limited examinations for reoccurring indications may be performed as noted.  Right Findings: +----------+------------+---------+-----------+----------+-------+ RIGHT     CompressiblePhasicitySpontaneousPropertiesSummary +----------+------------+---------+-----------+----------+-------+ Subclavian    Full       Yes       Yes                      +----------+------------+---------+-----------+----------+-------+  Left Findings: +----------+------------+---------+-----------+----------+-----------------+ LEFT      CompressiblePhasicitySpontaneousProperties     Summary       +----------+------------+---------+-----------+----------+-----------------+ IJV           Full       Yes       Yes                                +----------+------------+---------+-----------+----------+-----------------+ Subclavian    Full       Yes       Yes                                +----------+------------+---------+-----------+----------+-----------------+ Axillary      Full       Yes       Yes                                +----------+------------+---------+-----------+----------+-----------------+ Brachial      Full       Yes       Yes                                +----------+------------+---------+-----------+----------+-----------------+ Radial        Full                                                    +----------+------------+---------+-----------+----------+-----------------+ Ulnar         Full                                                    +----------+------------+---------+-----------+----------+-----------------+ Cephalic      None                                  Age Indeterminate +----------+------------+---------+-----------+----------+-----------------+ Basilic       Full                                                    +----------+------------+---------+-----------+----------+-----------------+  Summary:  Right: No evidence of thrombosis in the subclavian.  Left: No evidence of deep vein thrombosis in the upper extremity. Findings consistent with age indeterminate superficial vein thrombosis involving the left cephalic vein.  *See table(s) above for measurements and observations.  Diagnosing physician: Servando Snare MD Electronically signed by Servando Snare MD on 03/21/2020 at 1:01:01 PM.    Final      Scheduled Meds: . aspirin  300 mg Rectal Once  . atenolol  25 mg Oral Daily  . calcium-vitamin D  1 tablet Oral BID  . mirabegron ER  25 mg Oral Daily  . multivitamin with minerals  1 tablet Oral Daily  . nystatin  5  mL Oral QID  . pantoprazole  40 mg Oral Daily   Continuous Infusions: .  lactated ringers 1,000 mL with potassium chloride 20 mEq infusion    . lactated ringers with KCl 20 mEq/L    . sodium chloride       LOS: 1 day    Time spent: Starr School, MD [facility] To contact the attending provider between 7A-7P or the covering provider during after hours 7P-7A, please log into the web site [email or web address] and access using universal Vander password for that web site. If you do not have the password, please call the hospital operator.  03/22/2020, 8:12 AM

## 2020-03-22 NOTE — Evaluation (Signed)
Physical Therapy Evaluation Patient Details Name: Mary Rice MRN: [id number] DOB: 28-Oct-1932 Today's Date: 03/22/2020   History of Present Illness  86 y.o. female with medical history significant for  DVT/PE and not on anticoagulation, hypertension, dyslipidemia, and A. fib who presents to the emergency room for evaluation of swelling and pain involving her left arm as well as mental status changes  Clinical Impression  Pt admitted with above diagnosis.  Pt currently with functional limitations due to the deficits listed below (see PT Problem List). Pt will benefit from skilled PT to increase their independence and safety with mobility to allow discharge to the venue listed below.  Pt assisted to [facility] per request and then recliner as lunch arrived.  Daughter in law present and states spouse and daughter assist pt if needed at home (mostly spouse).  Pt was working with [facility] prior to admission.     Follow Up Recommendations Home health PT;Supervision for mobility/OOB    Equipment Recommendations  None recommended by PT    Recommendations for Other Services       Precautions / Restrictions Precautions Precautions: Fall Restrictions Weight Bearing Restrictions: No      Mobility  Bed Mobility Overal bed mobility: Needs Assistance Bed Mobility: Supine to Sit     Supine to sit: Min assist     General bed mobility comments: assisted by daughter in law  Transfers Overall transfer level: Needs assistance Equipment used: None Transfers: Sit to/from Stand Sit to Stand: Mod assist         General transfer comment: assist to rise and steady, cues for hand placement on armrests for support, mod assist to [facility], min assist to recliner  Ambulation/Gait             General Gait Details: deferred today, pt requested up to [facility] and then lunch arrived, pt also fatigued  Stairs            Wheelchair Mobility    Modified Rankin (Stroke Patients Only)       Balance  Overall balance assessment: Needs assistance         Standing balance support: Bilateral upper extremity supported Standing balance-Leahy Scale: Poor Standing balance comment: reliant on UE support                             Pertinent Vitals/Pain Pain Assessment: No/denies pain    Home Living Family/patient expects to be discharged to:: Private residence Living Arrangements: Spouse/significant other Available Help at Discharge: Family;Available 24 hours/day Type of Home: House Home Access: Ramped entrance     Home Layout: Able to live on main level with bedroom/bathroom Home Equipment: Walker - 2 wheels;Walker - 4 wheels;Bedside commode      Prior Function Level of Independence: Independent with assistive device(s)         Comments: was using rollator or RW at home, working with HHPT, family assisting as needed     Hand Dominance        Extremity/Trunk Assessment        Lower Extremity Assessment Lower Extremity Assessment: Generalized weakness    Cervical / Trunk Assessment Cervical / Trunk Assessment: Normal  Communication   Communication: No difficulties  Cognition Arousal/Alertness: Awake/alert Behavior During Therapy: Flat affect Overall Cognitive Status: Impaired/Different from baseline  General Comments: pt with minimal verbalizations for therapist, daughter in law present and reports pt just finished with SLP and not happy with results, following simple commands      General Comments      Exercises     Assessment/Plan    PT Assessment Patient needs continued PT services  PT Problem List Decreased strength;Decreased mobility;Decreased activity tolerance;Decreased balance;Decreased knowledge of use of DME;Decreased cognition       PT Treatment Interventions DME instruction;Therapeutic exercise;Gait training;Balance training;Functional mobility training;Therapeutic  activities;Patient/family education    PT Goals (Current goals can be found in the Care Plan section)  Acute Rehab PT Goals PT Goal Formulation: With patient/family Time For Goal Achievement: 04/05/20 Potential to Achieve Goals: Good    Frequency Min 3X/week   Barriers to discharge        Co-evaluation               AM-PAC PT "6 Clicks" Mobility  Outcome Measure Help needed turning from your back to your side while in a flat bed without using bedrails?: A Little Help needed moving from lying on your back to sitting on the side of a flat bed without using bedrails?: A Little Help needed moving to and from a bed to a chair (including a wheelchair)?: A Little Help needed standing up from a chair using your arms (e.g., wheelchair or bedside chair)?: A Lot Help needed to walk in hospital room?: A Lot Help needed climbing 3-5 steps with a railing? : Total 6 Click Score: 14    End of Session Equipment Utilized During Treatment: Gait belt Activity Tolerance: Patient tolerated treatment well Patient left: in chair;with call bell/phone within reach;with family/visitor present (daughter in law present and will call for assist out of recliner)   PT Visit Diagnosis: Difficulty in walking, not elsewhere classified (R26.2);Unsteadiness on feet (R26.81)    Time: 8757-9728 PT Time Calculation (min) (ACUTE ONLY): 20 min   Charges:   PT Evaluation $PT Eval Low Complexity: 1 Low         Kati PT, DPT Acute Rehabilitation Services Pager: [phone] Office: [phone]  Trena Platt 03/22/2020, 4:25 PM

## 2020-03-23 DIAGNOSIS — R509 Fever, unspecified: Secondary | ICD-10-CM

## 2020-03-23 DIAGNOSIS — I808 Phlebitis and thrombophlebitis of other sites: Secondary | ICD-10-CM

## 2020-03-23 DIAGNOSIS — E871 Hypo-osmolality and hyponatremia: Secondary | ICD-10-CM

## 2020-03-23 DIAGNOSIS — R338 Other retention of urine: Secondary | ICD-10-CM

## 2020-03-23 DIAGNOSIS — G9341 Metabolic encephalopathy: Secondary | ICD-10-CM

## 2020-03-23 DIAGNOSIS — E876 Hypokalemia: Secondary | ICD-10-CM

## 2020-03-23 LAB — CBC WITH DIFFERENTIAL/PLATELET
Abs Immature Granulocytes: 0.05 10*3/uL (ref 0.00–0.07)
Basophils Absolute: 0.1 10*3/uL (ref 0.0–0.1)
Basophils Relative: 1 %
Eosinophils Absolute: 0.1 10*3/uL (ref 0.0–0.5)
Eosinophils Relative: 1 %
HCT: 37.2 % (ref 36.0–46.0)
Hemoglobin: 12.5 g/dL (ref 12.0–15.0)
Immature Granulocytes: 1 %
Lymphocytes Relative: 4 %
Lymphs Abs: 0.4 10*3/uL — ABNORMAL LOW (ref 0.7–4.0)
MCH: 30.3 pg (ref 26.0–34.0)
MCHC: 33.6 g/dL (ref 30.0–36.0)
MCV: 90.3 fL (ref 80.0–100.0)
Monocytes Absolute: 1.2 10*3/uL — ABNORMAL HIGH (ref 0.1–1.0)
Monocytes Relative: 14 %
Neutro Abs: 6.4 10*3/uL (ref 1.7–7.7)
Neutrophils Relative %: 79 %
Platelets: 163 10*3/uL (ref 150–400)
RBC: 4.12 MIL/uL (ref 3.87–5.11)
RDW: 17 % — ABNORMAL HIGH (ref 11.5–15.5)
WBC: 8.2 10*3/uL (ref 4.0–10.5)
nRBC: 0 % (ref 0.0–0.2)

## 2020-03-23 LAB — COMPREHENSIVE METABOLIC PANEL
ALT: 15 U/L (ref 0–44)
AST: 16 U/L (ref 15–41)
Albumin: 2.9 g/dL — ABNORMAL LOW (ref 3.5–5.0)
Alkaline Phosphatase: 44 U/L (ref 38–126)
Anion gap: 7 (ref 5–15)
BUN: 18 mg/dL (ref 8–23)
CO2: 22 mmol/L (ref 22–32)
Calcium: 8.3 mg/dL — ABNORMAL LOW (ref 8.9–10.3)
Chloride: 103 mmol/L (ref 98–111)
Creatinine, Ser: 0.53 mg/dL (ref 0.44–1.00)
GFR calc Af Amer: >60 mL/min (ref >60)
GFR calc non Af Amer: >60 mL/min (ref >60)
Glucose, Bld: 104 mg/dL — ABNORMAL HIGH (ref 70–99)
Potassium: 3.8 mmol/L (ref 3.5–5.1)
Sodium: 132 mmol/L — ABNORMAL LOW (ref 135–145)
Total Bilirubin: 1 mg/dL (ref 0.3–1.2)
Total Protein: 5.3 g/dL — ABNORMAL LOW (ref 6.5–8.1)

## 2020-03-23 LAB — GLUCOSE, CAPILLARY
Glucose-Capillary: 98 mg/dL (ref 70–99)
Glucose-Capillary: 167 mg/dL — ABNORMAL HIGH (ref 70–99)
Glucose-Capillary: 133 mg/dL — ABNORMAL HIGH (ref 70–99)

## 2020-03-23 LAB — MAGNESIUM: Magnesium: 2 mg/dL (ref 1.7–2.4)

## 2020-03-23 MED ORDER — MIRABEGRON ER 25 MG PO TB24
25.0000 mg | ORAL_TABLET | Freq: Every day | ORAL | 1 refills | Status: DC
Start: 2020-03-24 — End: 2020-04-06

## 2020-03-23 MED ORDER — NYSTATIN 100000 UNIT/ML MT SUSP: 5.0000 mL | Freq: Four times a day (QID) | OROMUCOSAL | 0 refills | Status: AC

## 2020-03-23 NOTE — Discharge Summary (Signed)
Physician Discharge Summary  Mary Rice HER:[id number] DOB: 1932-08-24 DOA: 03/21/2020  PCP: Rusty Aus, MD  Admit date: 03/21/2020 Discharge date: 03/23/2020  Time spent: 55 minutes  Recommendations for Outpatient Follow-up:  1. Follow-up with Rusty Aus, MD in 1 to 2 weeks.  On follow-up patient will need a basic metabolic profile done to follow-up on electrolytes and renal function.  Patient's hyponatremia need to be reassessed to see whether patient still needs salt tablets on discharge.  Patient may benefit for referral to outpatient neurology due to concerns for dementia.  Urinary retention will need to be followed up upon.   Discharge Diagnoses:  Principal Problem:   AMS (altered mental status) Active Problems:   Atrial fibrillation (HCC)   Essential hypertension   Fever   Generalized weakness   Thrombophlebitis arm   Discharge Condition: Stable and improved  Diet recommendation: Heart healthy/dysphagia 3 diet with thin liquids/1500 cc fluid restriction per day  Filed Weights   03/21/20 1048 03/21/20 1500  Weight: 89.8 kg 89.8 kg    History of present illness:  HPI per Dr. Juanito Doom is a 86 y.o. female with medical history significant for  DVT/PE and not on anticoagulation, hypertension, dyslipidemia, and A. fib who presents to the emergency room for evaluation of swelling and pain involving her left arm as well as mental status changes  Patient was recently discharged from the hospital on 03/15/20 following treatment for pneumonia and was discharged home on Levaquin 750 mg daily for 5 days.  Patient's daughter states that her IV had infiltrated during that hospital stay and following her discharge she has had pain and swelling in her left arm.  She also stated that her mother had become increasingly weak and unable to get around with her walker.  She notes some left-sided weakness Upon arrival to the ER to have a fever with a T-max of 101.71F, blood  pressure was 138/95, pulse oximetry 96% on room air Labs show sodium 131, potassium 3.7, chloride 94, bicarb 25, BUN 15, creatinine 0.65, calcium 8.9, AST 19, ALT 16, lactic acid 2.1, white count 14.3 with a left shift, hemoglobin 14.7, hematocrit 44.3, MCV 89.3, RDW 17.1, platelet count 225 UA was sterile CT scan of the head without contrast showed no acute intracranial pathology.  Chronic microvascular disease and cerebral atrophy. Left upper extremity ultrasound showed no evidence of DVT in upper extremity findings consistent with age-indeterminate superficial vein thrombosis involving the left cephalic vein Twelve-lead EKG reviewed by me shows atrial fibrillation    ED Course: Patient is an 86 year old female who was recently discharged from the hospital following treatment for community-acquired pneumonia and who presents to the ER today for evaluation of fever, left upper extremity swelling and pain as well as generalized weakness.  Venous Doppler shows superficial thrombophlebitis involving the left cephalic vein.  Patient had a T-max of 101 and has leukocytosis.  She will be admitted to the hospital for further evaluation  Hospital Course:  #1 acute toxic metabolic encephalopathy superimposed on probable dementia with contribution of delirium, stroke ruled out Patient noted to have presented with confusion, noted to have a temperature on presentation of 101 and leukocytosis.  Head CT which was done was negative for any acute abnormalities.  MRI brain was negative for any acute infarct however showed microvascular chronic changes with no acute stroke.  Patient was noted to have had confusion several days prior to admission and due to concerns for fever patient was  brought in.  Fever work-up unremarkable.  Patient noted to have had some loose stools however was in the setting of laxatives.  Patient initially placed on IV antibiotics which was subsequently discontinued.  Patient remained  afebrile.  Patient noted to have had a kyphoplasty 02/26/2020 and daughter concerned about infection however patient remained afebrile, had no pain in that region.  Patient improved clinically and was close to baseline by day of discharge.  Patient be discharged home in stable and improved condition.  Outpatient follow-up with PCP.  2.  Fever Patient recently treated with IV antibiotics during her hospitalization and discharged on Levaquin for pneumonia which she has completed.  Patient with no further signs or symptoms of infection.  Left upper extremity was negative for DVT however noted to have some superficial phlebitis.  Urinalysis done was nitrite negative leukocytes negative.  Blood cultures done with no growth to date.  Urine cultures were negative.  Patient was initially placed on IV antibiotics which was subsequently discontinued.  Patient remained afebrile off antibiotics.  Patient improved clinically.  Patient be discharged home in stable and improved condition.  3.  Probable underlying dementia/delirium Per Dr. Verlon Au patient's daughter admitted to patient having some sundowning at times.  Patient may have dementia however was alert and oriented to self and place.  Patient more alert today interacting and likely close to baseline.  May benefit from outpatient neurology referral.  Outpatient follow-up with PCP.  4.  History of bilateral pulmonary emboli 2007 Patient noted not to be an anticoagulation candidate secondary to falls and debility.  Stable.  5.  Permanent atrial fibrillation CHA2DS2VASC score > 4.  Patient maintained on beta-blocker for rate control.  Patient noted not to be an anticoagulation candidate secondary to falls and increased risk of bleeding.  Outpatient follow-up.  6.  Probable hypovolemic hyponatremia/chronic hyponatremia Felt secondary to excessive drinking/Poto mania.  Patient was placed on fluid restriction of 1500 cc/day.  Hyponatremia improved.  Patient noted  to have been on salt tablets 1 g daily prior to admission which will be resumed on discharge.  Outpatient follow-up.  7.  Hypokalemia Repleted.  Outpatient follow-up.  8.  New onset urinary retention Patient noted to have urinary retention during the hospitalization.  Bladder scan obtained.  Patient started on Myrbetriq and Foley catheter placed with good urine output.  Foley catheter discontinued on discharge.  Outpatient follow-up with PCP.  9.  Prior esophageal obstruction follows Dr. Silverio Decamp Remained stable.  Patient seen by speech therapy and patient placed on a dysphagia 3 diet.  Outpatient follow-up.  Procedures:  MRI brain 03/21/2020  CT head 03/21/2020  Chest x-ray 03/21/2020  Left upper extremity venous duplex 03/21/2020  Consultations:  None  Discharge Exam: Vitals:   03/23/20 0546 03/23/20 1551  BP: (!) 150/69 (!) 160/74  Pulse: 83 71  Resp: 16 13  Temp: 97.9 F (36.6 C) 98.7 F (37.1 C)  SpO2: 97% 96%    General: NAD Cardiovascular: RRR Respiratory: CTAB  Discharge Instructions   Discharge Instructions    Diet - low sodium heart healthy   Complete by: As directed    Dysphagia 3 diet with thin liquids. Fluid restriction 1500cc/day   Increase activity slowly   Complete by: As directed    Dysphagia 3 diet with thin liquids/ soft diet.     Allergies as of 03/23/2020      Reactions   Biaxin [clarithromycin]    Reaction unknown   Maxzide [hydrochlorothiazide W-triamterene]  Reaction unknown   Penicillins Rash   Sulfa Antibiotics Rash      Medication List    STOP taking these medications   levofloxacin 750 MG tablet Commonly known as: LEVAQUIN   traMADol 50 MG tablet Commonly known as: ULTRAM     TAKE these medications   acetaminophen 650 MG CR tablet Commonly known as: TYLENOL Take 1,300 mg by mouth every 8 (eight) hours as needed for pain.   aspirin EC 325 MG tablet Take 325 mg by mouth daily.   atenolol 25 MG tablet Commonly  known as: TENORMIN Take 25 mg by mouth daily.   CALCIUM 600 + D PO Take 1 tablet by mouth daily.   calcium carbonate 500 MG chewable tablet Commonly known as: TUMS - dosed in mg elemental calcium Chew 1 tablet by mouth daily as needed for indigestion or heartburn.   Cholecalciferol 50 MCG (2000 UT) Tabs Take 2,000 Units by mouth daily.   docusate sodium 100 MG capsule Commonly known as: COLACE Take 100 mg by mouth at bedtime.   mirabegron ER 25 MG Tb24 tablet Commonly known as: MYRBETRIQ Take 1 tablet (25 mg total) by mouth daily. Start taking on: March 24, 2020   multivitamin with minerals Tabs tablet Take 1 tablet by mouth daily.   nystatin 100000 UNIT/ML suspension Commonly known as: MYCOSTATIN Take 5 mLs (500,000 Units total) by mouth 4 (four) times daily for 5 days.   pantoprazole 40 MG tablet Commonly known as: PROTONIX Take 40 mg by mouth daily.   polyethylene glycol 17 g packet Commonly known as: MIRALAX / GLYCOLAX Take 17 g by mouth daily as needed for mild constipation.   pravastatin 20 MG tablet Commonly known as: PRAVACHOL Take 20 mg by mouth at bedtime.   sodium chloride 1 g tablet Take 1 g by mouth daily.   vitamin C 1000 MG tablet Take 1,000 mg by mouth daily.      Allergies  Allergen Reactions  . Biaxin [Clarithromycin]     Reaction unknown  . Maxzide [Hydrochlorothiazide W-Triamterene]     Reaction unknown  . Penicillins Rash  . Sulfa Antibiotics Rash    Follow-up Information    Rusty Aus, MD. Schedule an appointment as soon as possible for a visit in 2 week(s).   Specialty: Internal Medicine Why: f/u in 1-2 weeks. Contact information: [address] [place] [address] [phone]                The results of significant diagnostics from this hospitalization (including imaging, microbiology, ancillary and laboratory) are listed below for reference.    Significant  Diagnostic Studies: DG Chest 1 View  Result Date: 03/13/2020 CLINICAL DATA:  Altered mental status EXAM: CHEST  1 VIEW COMPARISON:  08/21/2016 FINDINGS: There is focal pulmonary infiltrate noted within the right upper lobe, likely infectious in the appropriate clinical setting. No pneumothorax or pleural effusion. Cardiac size within normal limits. Pulmonary vascularity is normal. No acute bone abnormality. IMPRESSION: Focal pulmonary infiltrate in the right upper lobe, likely infectious in the appropriate clinical setting. Electronically Signed   By: Fidela Salisbury MD   On: 03/13/2020 21:10   DG Chest 2 View  Result Date: 03/21/2020 CLINICAL DATA:  Sepsis, weakness, headache, fever, cellulitis, nausea, pneumonia EXAM: CHEST - 2 VIEW COMPARISON:  03/14/2020 FINDINGS: Prior right upper lobe opacity is improved/resolved. No frank interstitial edema. Small bilateral pleural effusions, left greater than right. No pneumothorax. The heart is  normal in size. IMPRESSION: Prior right upper lobe opacity is improved/resolved. Small bilateral pleural effusions, left greater than right. Electronically Signed   By: Julian Hy M.D.   On: 03/21/2020 09:45   DG Thoracic Spine 2 View  Result Date: 02/26/2020 CLINICAL DATA:  T11 kyphoplasty EXAM: DG C-ARM 1-60 MIN; THORACIC SPINE 2 VIEWS FLUOROSCOPY TIME:  Fluoroscopy Time:  1 minutes 36 seconds Radiation Exposure Index (if provided by the fluoroscopic device): 21.241 mGy Number of Acquired Spot Images: 2 COMPARISON:  MR lumbar spine 02/17/2020 FINDINGS: Interval T11 kyphoplasty with mild residual height loss. Diffuse osseous demineralization. Degenerative disc disease changes T9-T10 and T10-T11. IMPRESSION: T11 kyphoplasty. Electronically Signed   By: Lavonia Dana M.D.   On: 02/26/2020 12:43   DG Abdomen 1 View  Result Date: 03/13/2020 CLINICAL DATA:  Ileus EXAM: ABDOMEN - 1 VIEW COMPARISON:  None. FINDINGS: Normal abdominal gas pattern. Moderate stool within the  rectal vault. No gross free intraperitoneal gas. Cholecystectomy clips noted within the right upper quadrant. T11 vertebroplasty has been performed. Moderate lumbar levoscoliosis. No acute bone abnormality. IMPRESSION: Moderate stool within the rectal vault. No evidence of bowel obstruction or free intraperitoneal gas. Electronically Signed   By: Fidela Salisbury MD   On: 03/13/2020 21:12   CT Head Wo Contrast  Result Date: 03/21/2020 CLINICAL DATA:  Altered mental status, weakness EXAM: CT HEAD WITHOUT CONTRAST TECHNIQUE: Contiguous axial images were obtained from the base of the skull through the vertex without intravenous contrast. COMPARISON:  None. FINDINGS: Brain: No evidence of acute infarction, hemorrhage, extra-axial collection, ventriculomegaly, or mass effect. Generalized cerebral atrophy. Periventricular white matter low attenuation likely secondary to microangiopathy. Vascular: Cerebrovascular atherosclerotic calcifications are noted. Skull: Negative for fracture or focal lesion. Sinuses/Orbits: Visualized portions of the orbits are unremarkable. Visualized portions of the paranasal sinuses are unremarkable. Visualized portions of the mastoid air cells are unremarkable. Other: None. IMPRESSION: 1. No acute intracranial pathology. 2. Chronic microvascular disease and cerebral atrophy. Electronically Signed   By: Kathreen Devoid   On: 03/21/2020 11:32   CT Head Wo Contrast  Result Date: 03/13/2020 CLINICAL DATA:  Altered mental status EXAM: CT HEAD WITHOUT CONTRAST TECHNIQUE: Contiguous axial images were obtained from the base of the skull through the vertex without intravenous contrast. COMPARISON:  None. FINDINGS: Brain: There is atrophy and chronic small vessel disease changes. No acute intracranial abnormality. Specifically, no hemorrhage, hydrocephalus, mass lesion, acute infarction, or significant intracranial injury. Vascular: No hyperdense vessel or unexpected calcification. Skull: No acute  calvarial abnormality. Sinuses/Orbits: Visualized paranasal sinuses and mastoids clear. Orbital soft tissues unremarkable. Other: None IMPRESSION: Atrophy, chronic microvascular disease. No acute intracranial abnormality. Electronically Signed   By: Rolm Baptise M.D.   On: 03/13/2020 21:14   CT Angio Chest PE W and/or Wo Contrast  Result Date: 03/14/2020 CLINICAL DATA:  Altered mental status. Vomiting. Distended stomach. High probability for pulmonary embolus. EXAM: CT ANGIOGRAPHY CHEST CT ABDOMEN AND PELVIS WITH CONTRAST TECHNIQUE: Multidetector CT imaging of the chest was performed using the standard protocol during bolus administration of intravenous contrast. Multiplanar CT image reconstructions and MIPs were obtained to evaluate the vascular anatomy. Multidetector CT imaging of the abdomen and pelvis was performed using the standard protocol during bolus administration of intravenous contrast. CONTRAST:  177mL OMNIPAQUE IOHEXOL 350 MG/ML SOLN COMPARISON:  None. FINDINGS: CTA CHEST FINDINGS Cardiovascular: Good opacification of the central and segmental pulmonary arteries. No focal filling defects. No evidence of significant pulmonary embolus. Normal heart size. No pericardial effusions.  Coronary artery and aortic calcification. No aortic aneurysm. Mediastinum/Nodes: Small esophageal hiatal hernia. Esophagus is decompressed. No significant lymphadenopathy. Thyroid gland is unremarkable. Lungs/Pleura: Small bilateral pleural effusions with basilar atelectasis. Airspace disease in the right upper lung and right middle lung could represent pneumonia or asymmetrical edema. Musculoskeletal: Old vertebral compression deformity post kyphoplasty. Old rib fractures. No acute bony changes. Review of the MIP images confirms the above findings. CT ABDOMEN and PELVIS FINDINGS Hepatobiliary: Surgical absence of the gallbladder. No bile duct dilatation. Liver is unremarkable. Pancreas: Fatty infiltration of the pancreas. No  acute changes identified. Spleen: Normal in size without focal abnormality. Adrenals/Urinary Tract: Adrenal glands are unremarkable. Kidneys are normal, without renal calculi, focal lesion, or hydronephrosis. Bladder is unremarkable. Stomach/Bowel: Stomach, small bowel, and colon are mostly decompressed. Colonic diverticulosis without evidence of diverticulitis. Appendix is not identified. Vascular/Lymphatic: Aortic atherosclerosis. No enlarged abdominal or pelvic lymph nodes. Reproductive: Uterus is surgically absent. There is a heterogeneous circumscribed presacral mass measuring 3.8 x 3.9 cm in diameter. The mass is partially fat density. This could represent an ovarian lesion in an abnormal location, an old hematoma or area of fat necrosis, or a mass associated with the sacrum. Other: No free air or free fluid in the abdomen. Musculoskeletal: Lumbar scoliosis convex towards the left. Degenerative changes in the spine and hips. No destructive bone lesions. Review of the MIP images confirms the above findings. IMPRESSION: 1. No evidence of significant pulmonary embolus. 2. Small bilateral pleural effusions with basilar atelectasis. 3. Airspace disease in the right upper lung and right middle lung could represent pneumonia or asymmetrical edema. 4. Small esophageal hiatal hernia. 5. Colonic diverticulosis without evidence of diverticulitis. 6. 3.8 cm heterogeneous circumscribed presacral mass with fat density. This could represent an ovarian lesion, old hematoma or area of fat necrosis, or a mass associated with the sacrum. Aortic atherosclerosis. Aortic Atherosclerosis (ICD10-I70.0). Electronically Signed   By: Lucienne Capers M.D.   On: 03/14/2020 03:29   MR BRAIN WO CONTRAST  Result Date: 03/21/2020 CLINICAL DATA:  86 year old female with altered mental status, weakness. Left side weakness. Confusion. EXAM: MRI HEAD WITHOUT CONTRAST TECHNIQUE: Multiplanar, multiecho pulse sequences of the brain and  surrounding structures were obtained without intravenous contrast. COMPARISON:  Head CT earlier today, and 03/13/2020. FINDINGS: Brain: No restricted diffusion to suggest acute infarction. No midline shift, mass effect, evidence of mass lesion, ventriculomegaly, extra-axial collection or acute intracranial hemorrhage. Cervicomedullary junction and pituitary are within normal limits. Patchy and confluent bilateral cerebral white matter T2 and FLAIR hyperintensity, relatively sparing the deep white matter capsules. No superimposed cortical encephalomalacia or definite chronic cerebral blood products. The deep gray matter nuclei, brainstem and cerebellum are within normal limits for age. Vascular: Major intracranial vascular flow voids are preserved. Skull and upper cervical spine: Mild for age visible cervical spine degeneration. Visualized bone marrow signal is within normal limits. Sinuses/Orbits: Postoperative changes to both globes, otherwise negative orbits. Paranasal sinuses and mastoids are stable and well pneumatized. Other: Visible internal auditory structures appear normal. Scalp and face appear negative. IMPRESSION: 1. No acute intracranial abnormality. 2. Moderately advanced nonspecific cerebral white matter signal changes, most commonly due to chronic small vessel disease. Electronically Signed   By: Genevie Ann M.D.   On: 03/21/2020 19:07   CT ABDOMEN PELVIS W CONTRAST  Result Date: 03/14/2020 CLINICAL DATA:  Altered mental status. Vomiting. Distended stomach. High probability for pulmonary embolus. EXAM: CT ANGIOGRAPHY CHEST CT ABDOMEN AND PELVIS WITH CONTRAST TECHNIQUE: Multidetector CT imaging  of the chest was performed using the standard protocol during bolus administration of intravenous contrast. Multiplanar CT image reconstructions and MIPs were obtained to evaluate the vascular anatomy. Multidetector CT imaging of the abdomen and pelvis was performed using the standard protocol during bolus  administration of intravenous contrast. CONTRAST:  161mL OMNIPAQUE IOHEXOL 350 MG/ML SOLN COMPARISON:  None. FINDINGS: CTA CHEST FINDINGS Cardiovascular: Good opacification of the central and segmental pulmonary arteries. No focal filling defects. No evidence of significant pulmonary embolus. Normal heart size. No pericardial effusions. Coronary artery and aortic calcification. No aortic aneurysm. Mediastinum/Nodes: Small esophageal hiatal hernia. Esophagus is decompressed. No significant lymphadenopathy. Thyroid gland is unremarkable. Lungs/Pleura: Small bilateral pleural effusions with basilar atelectasis. Airspace disease in the right upper lung and right middle lung could represent pneumonia or asymmetrical edema. Musculoskeletal: Old vertebral compression deformity post kyphoplasty. Old rib fractures. No acute bony changes. Review of the MIP images confirms the above findings. CT ABDOMEN and PELVIS FINDINGS Hepatobiliary: Surgical absence of the gallbladder. No bile duct dilatation. Liver is unremarkable. Pancreas: Fatty infiltration of the pancreas. No acute changes identified. Spleen: Normal in size without focal abnormality. Adrenals/Urinary Tract: Adrenal glands are unremarkable. Kidneys are normal, without renal calculi, focal lesion, or hydronephrosis. Bladder is unremarkable. Stomach/Bowel: Stomach, small bowel, and colon are mostly decompressed. Colonic diverticulosis without evidence of diverticulitis. Appendix is not identified. Vascular/Lymphatic: Aortic atherosclerosis. No enlarged abdominal or pelvic lymph nodes. Reproductive: Uterus is surgically absent. There is a heterogeneous circumscribed presacral mass measuring 3.8 x 3.9 cm in diameter. The mass is partially fat density. This could represent an ovarian lesion in an abnormal location, an old hematoma or area of fat necrosis, or a mass associated with the sacrum. Other: No free air or free fluid in the abdomen. Musculoskeletal: Lumbar  scoliosis convex towards the left. Degenerative changes in the spine and hips. No destructive bone lesions. Review of the MIP images confirms the above findings. IMPRESSION: 1. No evidence of significant pulmonary embolus. 2. Small bilateral pleural effusions with basilar atelectasis. 3. Airspace disease in the right upper lung and right middle lung could represent pneumonia or asymmetrical edema. 4. Small esophageal hiatal hernia. 5. Colonic diverticulosis without evidence of diverticulitis. 6. 3.8 cm heterogeneous circumscribed presacral mass with fat density. This could represent an ovarian lesion, old hematoma or area of fat necrosis, or a mass associated with the sacrum. Aortic atherosclerosis. Aortic Atherosclerosis (ICD10-I70.0). Electronically Signed   By: Lucienne Capers M.D.   On: 03/14/2020 03:29   DG C-Arm 1-60 Min  Result Date: 02/26/2020 CLINICAL DATA:  T11 kyphoplasty EXAM: DG C-ARM 1-60 MIN; THORACIC SPINE 2 VIEWS FLUOROSCOPY TIME:  Fluoroscopy Time:  1 minutes 36 seconds Radiation Exposure Index (if provided by the fluoroscopic device): 21.241 mGy Number of Acquired Spot Images: 2 COMPARISON:  MR lumbar spine 02/17/2020 FINDINGS: Interval T11 kyphoplasty with mild residual height loss. Diffuse osseous demineralization. Degenerative disc disease changes T9-T10 and T10-T11. IMPRESSION: T11 kyphoplasty. Electronically Signed   By: Lavonia Dana M.D.   On: 02/26/2020 12:43   UE VENOUS DUPLEX ([facility] & [facility] 7 am - 7 pm)  Result Date: 03/21/2020 UPPER VENOUS STUDY  Indications: Swelling Risk Factors: None identified. Limitations: Poor ultrasound/tissue interface and patient positioning. Comparison Study: No prior studies. Performing Technologist: Oliver Hum RVT  Examination Guidelines: A complete evaluation includes B-mode imaging, spectral Doppler, color Doppler, and power Doppler as needed of all accessible portions of each vessel. Bilateral testing is considered an integral part of a complete  examination. Limited examinations for reoccurring indications may be performed as noted.  Right Findings: +----------+------------+---------+-----------+----------+-------+ RIGHT     CompressiblePhasicitySpontaneousPropertiesSummary +----------+------------+---------+-----------+----------+-------+ Subclavian    Full       Yes       Yes                      +----------+------------+---------+-----------+----------+-------+  Left Findings: +----------+------------+---------+-----------+----------+-----------------+ LEFT      CompressiblePhasicitySpontaneousProperties     Summary      +----------+------------+---------+-----------+----------+-----------------+ IJV           Full       Yes       Yes                                +----------+------------+---------+-----------+----------+-----------------+ Subclavian    Full       Yes       Yes                                +----------+------------+---------+-----------+----------+-----------------+ Axillary      Full       Yes       Yes                                +----------+------------+---------+-----------+----------+-----------------+ Brachial      Full       Yes       Yes                                +----------+------------+---------+-----------+----------+-----------------+ Radial        Full                                                    +----------+------------+---------+-----------+----------+-----------------+ Ulnar         Full                                                    +----------+------------+---------+-----------+----------+-----------------+ Cephalic      None                                  Age Indeterminate +----------+------------+---------+-----------+----------+-----------------+ Basilic       Full                                                    +----------+------------+---------+-----------+----------+-----------------+  Summary:  Right: No evidence  of thrombosis in the subclavian.  Left: No evidence of deep vein thrombosis in the upper extremity. Findings consistent with age indeterminate superficial vein thrombosis involving the left cephalic vein.  *See table(s) above for measurements and observations.  Diagnosing physician: Servando Snare MD Electronically signed by Servando Snare MD on 03/21/2020 at 1:01:01 PM.    Final     Microbiology: Recent Results (from  the past 240 hour(s))  Culture, blood (Routine x 2)     Status: None   Collection Time: 03/13/20  7:55 PM   Specimen: BLOOD  Result Value Ref Range Status   Specimen Description BLOOD BLOOD LEFT FOREARM  Final   Special Requests   Final    BOTTLES DRAWN AEROBIC AND ANAEROBIC Blood Culture adequate volume   Culture   Final    NO GROWTH 5 DAYS Performed at [facility], [address] [place], [place] [address]    Report Status 03/18/2020 FINAL  Final  Culture, blood (Routine x 2)     Status: None   Collection Time: 03/13/20  8:00 PM   Specimen: BLOOD  Result Value Ref Range Status   Specimen Description BLOOD LEFT RADIAL  Final   Special Requests   Final    BOTTLES DRAWN AEROBIC AND ANAEROBIC Blood Culture adequate volume   Culture   Final    NO GROWTH 5 DAYS Performed at [facility], [address] [place], [place] [address]    Report Status 03/18/2020 FINAL  Final  SARS Coronavirus 2 by RT PCR (hospital order, performed in [facility] hospital lab) Nasopharyngeal Nasopharyngeal Swab     Status: None   Collection Time: 03/13/20  8:20 PM   Specimen: Nasopharyngeal Swab  Result Value Ref Range Status   SARS Coronavirus 2 NEGATIVE NEGATIVE Final    Comment: (NOTE) SARS-CoV-2 target nucleic acids are NOT DETECTED.  The SARS-CoV-2 RNA is generally detectable in upper and lower respiratory specimens during the acute phase of infection. The lowest concentration of SARS-CoV-2 viral copies this assay can detect is 250 copies / mL. A negative result does  not preclude SARS-CoV-2 infection and should not be used as the sole basis for treatment or other patient management decisions.  A negative result may occur with improper specimen collection / handling, submission of specimen other than nasopharyngeal swab, presence of viral mutation(s) within the areas targeted by this assay, and inadequate number of viral copies (<250 copies / mL). A negative result must be combined with clinical observations, patient history, and epidemiological information.  Fact Sheet for Patients:   StrictlyIdeas.no  Fact Sheet for Healthcare Providers: BankingDealers.co.za  This test is not yet approved or  cleared by the [place] FDA and has been authorized for detection and/or diagnosis of SARS-CoV-2 by FDA under an Emergency Use Authorization (EUA).  This EUA will remain in effect (meaning this test can be used) for the duration of the COVID-19 declaration under Section 564(b)(1) of the Act, 21 U.S.C. section 360bbb-3(b)(1), unless the authorization is terminated or revoked sooner.  Performed at [facility], [address] [place], [place] [address]   Blood culture (routine single)     Status: None (Preliminary result)   Collection Time: 03/21/20 10:04 AM   Specimen: BLOOD RIGHT FOREARM  Result Value Ref Range Status   Specimen Description   Final    BLOOD RIGHT FOREARM Performed at [place] [address] [place], [place] [address]    Special Requests   Final    BOTTLES DRAWN AEROBIC AND ANAEROBIC Blood Culture results may not be optimal due to an excessive volume of blood received in culture bottles Performed at [facility] [address], [place], [place] [address]    Culture   Final    NO GROWTH 2 DAYS Performed at [place] [address], [place],  [address]  Report Status PENDING  Incomplete  SARS Coronavirus 2 by  RT PCR (hospital order, performed in [facility] hospital lab) Nasopharyngeal Nasopharyngeal Swab     Status: None   Collection Time: 03/21/20 10:07 AM   Specimen: Nasopharyngeal Swab  Result Value Ref Range Status   SARS Coronavirus 2 NEGATIVE NEGATIVE Final    Comment: (NOTE) SARS-CoV-2 target nucleic acids are NOT DETECTED.  The SARS-CoV-2 RNA is generally detectable in upper and lower respiratory specimens during the acute phase of infection. The lowest concentration of SARS-CoV-2 viral copies this assay can detect is 250 copies / mL. A negative result does not preclude SARS-CoV-2 infection and should not be used as the sole basis for treatment or other patient management decisions.  A negative result may occur with improper specimen collection / handling, submission of specimen other than nasopharyngeal swab, presence of viral mutation(s) within the areas targeted by this assay, and inadequate number of viral copies (<250 copies / mL). A negative result must be combined with clinical observations, patient history, and epidemiological information.  Fact Sheet for Patients:   StrictlyIdeas.no  Fact Sheet for Healthcare Providers: BankingDealers.co.za  This test is not yet approved or  cleared by the [place] FDA and has been authorized for detection and/or diagnosis of SARS-CoV-2 by FDA under an Emergency Use Authorization (EUA).  This EUA will remain in effect (meaning this test can be used) for the duration of the COVID-19 declaration under Section 564(b)(1) of the Act, 21 U.S.C. section 360bbb-3(b)(1), unless the authorization is terminated or revoked sooner.  Performed at [facility], [place] [address] [place], [place] [address]   Culture, blood (Routine x 2)     Status: None (Preliminary result)   Collection Time: 03/21/20 12:21 PM   Specimen: BLOOD RIGHT ARM  Result Value Ref Range Status   Specimen  Description BLOOD RIGHT ARM  Final   Special Requests   Final    BOTTLES DRAWN AEROBIC AND ANAEROBIC Blood Culture adequate volume   Culture   Final    NO GROWTH 2 DAYS Performed at [facility], [place] [address] [place], [place] [address]    Report Status PENDING  Incomplete  Urine culture     Status: None   Collection Time: 03/21/20 12:54 PM   Specimen: In/Out Cath Urine  Result Value Ref Range Status   Specimen Description   Final    IN/OUT CATH URINE Performed at [place] [address], [place], [place] [address]    Special Requests   Final    NONE Performed at [facility], [place] [address] [place], [place] [address]    Culture   Final    NO GROWTH Performed at [facility], [place] [address] [place],  [address]    Report Status 03/22/2020 FINAL  Final     Labs: Basic Metabolic Panel: Recent Labs  Lab 03/21/20 1037 03/22/20 0611 03/23/20 0553  NA 131* 127* 132*  K 3.7 3.3* 3.8  CL 94* 96* 103  CO2 25 21* 22  GLUCOSE 132* 102* 104*  BUN 15 14 18   CREATININE 0.65 0.55 0.53  CALCIUM 8.9 7.8* 8.3*  MG  --   --  2.0   Liver Function Tests: Recent Labs  Lab 03/21/20 1037 03/23/20 0553  AST 19 16  ALT 16 15  ALKPHOS 65 44  BILITOT 0.9 1.0  PROT 6.6 5.3*  ALBUMIN 3.8 2.9*   No results for  input(s): LIPASE, AMYLASE in the last 168 hours. No results for input(s): AMMONIA in the last 168 hours. CBC: Recent Labs  Lab 03/21/20 1037 03/22/20 0611 03/23/20 0553  WBC 14.3* 8.8 8.2  NEUTROABS 12.4*  --  6.4  HGB 14.7 12.0 12.5  HCT 44.3 36.0 37.2  MCV 89.3 89.3 90.3  PLT 225 166 163   Cardiac Enzymes: No results for input(s): CKTOTAL, CKMB, CKMBINDEX, TROPONINI in the last 168 hours. BNP: BNP (last 3 results) No results for input(s): BNP in the last 8760 hours.  ProBNP (last 3 results) No results for input(s): PROBNP in the last 8760 hours.  CBG: Recent Labs  Lab 03/22/20 1215  03/22/20 1618 03/22/20 2106 03/23/20 0806 03/23/20 1259  GLUCAP 94 110* 102* 98 167*       Signed:  Irine Seal MD.  [facility] 03/23/2020, 4:25 PM

## 2020-03-23 NOTE — Progress Notes (Signed)
Received verbal order from MD to discontinue foley catheter.

## 2020-03-23 NOTE — TOC Initial Note (Signed)
Transition of Care [facility]) - Initial/Assessment Note    Patient Details  Name: Mary Rice MRN: [id number] Date of Birth: 07-15-1932  Transition [facility] CM/SW Contact:    Lynnell Catalan, RN Phone Number: 03/23/2020, 2:11 PM  Clinical Narrative:                 Pt was active with [facility] for HHPT prior to admission. MD has placed home health orders and [facility] liaison alerted of order. Pt is choosing to stay with [facility] knowing that there will be a delay in the bath aide service.  Expected Discharge Plan: [place] Barriers to Discharge: No Barriers Identified   Patient Goals and CMS Choice        Expected Discharge Plan and Services Expected Discharge Plan: [place]   Discharge Planning Services: CM Consult      HH Arranged: PT, OT, Nurse's Aide [place] Agency: [place] ([facility]) Date HH Agency Contacted: 03/23/20 Time [place]: 5284 Representative spoke with at [place]: Santiago Glad  Prior Living Arrangements/Services              Need for Family Participation in Patient Care: Yes (Comment) Care giver support system in place?: Yes (comment) Current home services: Home PT    Activities of Daily Living Home Assistive Devices/Equipment: Environmental consultant (specify type) ADL Screening (condition at time of admission) Patient's cognitive ability adequate to safely complete daily activities?: No Is the patient deaf or have difficulty hearing?: No Does the patient have difficulty seeing, even when wearing glasses/contacts?: No Does the patient have difficulty concentrating, remembering, or making decisions?: No Patient able to express need for assistance with ADLs?: No Does the patient have difficulty dressing or bathing?: Yes Independently performs ADLs?:  (not now) Communication: Needs assistance Is this a change from baseline?: Change from baseline, expected to last <3 days Dressing (OT): Needs assistance Is this a change from  baseline?: Change from baseline, expected to last <3days Grooming: Needs assistance Is this a change from baseline?: Change from baseline, expected to last <3 days Feeding: Independent Bathing: Needs assistance Is this a change from baseline?: Change from baseline, expected to last <3 days Toileting: Needs assistance Is this a change from baseline?: Change from baseline, expected to last <3 days In/Out Bed: Needs assistance Is this a change from baseline?: Change from baseline, expected to last <3 days Walks in Home: Needs assistance Is this a change from baseline?: Change from baseline, expected to last <3 days Does the patient have difficulty walking or climbing stairs?: Yes (pt  feeling weak) Weakness of Legs: Both Weakness of Arms/Hands: Both  Permission Sought/Granted                  Emotional Assessment              Admission diagnosis:  Fever [R50.9] Cellulitis of left upper extremity [L03.114] Superficial thrombophlebitis of left upper extremity [I80.8] Diarrhea, unspecified type [R19.7] Sepsis without acute organ dysfunction, due to unspecified organism ([place]) [A41.9] AMS (altered mental status) [R41.82] Patient Active Problem List   Diagnosis Date Noted  . Fever 03/21/2020  . AMS (altered mental status) 03/21/2020  . Generalized weakness 03/21/2020  . Thrombophlebitis arm 03/21/2020  . Pneumonia involving right lung 03/14/2020  . Hypoxia 03/14/2020  . Nausea and vomiting 03/14/2020  . Delirium 03/14/2020  . Atrial fibrillation ([place]) 10/10/2015  . SOB (shortness of breath) 10/10/2015  . Obesity 10/10/2015  .  Hyperlipidemia 10/10/2015  . Essential hypertension 10/10/2015  . Food impaction of esophagus    PCP:  Rusty Aus, MD Pharmacy:   [facility], [place] [address] [place] [address] Phone: [phone] Fax: [phone]  [facility] [place], [place] [address] Phone: [phone] Fax: [place] #[id number] [place], [place] [address] [place] [address] Phone: [phone] Fax: [phone]     Social Determinants of Health (SDOH) Interventions    Readmission Risk Interventions No flowsheet data found.

## 2020-03-23 NOTE — Progress Notes (Addendum)
All discharge instructions were given to Pt and her daughter. All questions were answered. Pt is on Dys 3 upon discharge also she is on fluid restriction of 1500cc. Daughter verbalized understanding. Pt will be discharged home after voiding.

## 2020-03-23 NOTE — Progress Notes (Signed)
Assisted Pt to [facility]. Pt had 1 occurrence of urine and stool. Post void bladder scan shows 15ml. Pt is discharged home along with her daughter.

## 2020-03-26 LAB — CULTURE, BLOOD (SINGLE): Culture: NO GROWTH

## 2020-03-26 LAB — CULTURE, BLOOD (ROUTINE X 2)
Culture: NO GROWTH
Special Requests: ADEQUATE

## 2020-04-03 ENCOUNTER — Inpatient Hospital Stay (HOSPITAL_COMMUNITY)
Admission: EM | Admit: 2020-04-03 | Discharge: 2020-04-06 | DRG: 183 | Disposition: A | Discharge status: Home Health | Payer: Medicare Other | Attending: Family Medicine | Admitting: Family Medicine

## 2020-04-03 ENCOUNTER — Emergency Department (HOSPITAL_COMMUNITY): Payer: Medicare Other

## 2020-04-03 ENCOUNTER — Other Ambulatory Visit: Payer: Self-pay

## 2020-04-03 ENCOUNTER — Encounter (HOSPITAL_COMMUNITY): Payer: Self-pay | Admitting: Emergency Medicine

## 2020-04-03 DIAGNOSIS — Z888 Allergy status to other drugs, medicaments and biological substances status: Secondary | ICD-10-CM | POA: Diagnosis not present

## 2020-04-03 DIAGNOSIS — W19XXXA Unspecified fall, initial encounter: Secondary | ICD-10-CM

## 2020-04-03 DIAGNOSIS — E785 Hyperlipidemia, unspecified: Secondary | ICD-10-CM | POA: Diagnosis present

## 2020-04-03 DIAGNOSIS — Z880 Allergy status to penicillin: Secondary | ICD-10-CM | POA: Diagnosis not present

## 2020-04-03 DIAGNOSIS — W1812XA Fall from or off toilet with subsequent striking against object, initial encounter: Secondary | ICD-10-CM | POA: Diagnosis present

## 2020-04-03 DIAGNOSIS — I482 Chronic atrial fibrillation, unspecified: Secondary | ICD-10-CM | POA: Diagnosis not present

## 2020-04-03 DIAGNOSIS — D1779 Benign lipomatous neoplasm of other sites: Secondary | ICD-10-CM | POA: Diagnosis present

## 2020-04-03 DIAGNOSIS — Z86718 Personal history of other venous thrombosis and embolism: Secondary | ICD-10-CM | POA: Diagnosis not present

## 2020-04-03 DIAGNOSIS — Z86711 Personal history of pulmonary embolism: Secondary | ICD-10-CM

## 2020-04-03 DIAGNOSIS — E876 Hypokalemia: Secondary | ICD-10-CM | POA: Diagnosis present

## 2020-04-03 DIAGNOSIS — I4821 Permanent atrial fibrillation: Secondary | ICD-10-CM | POA: Diagnosis present

## 2020-04-03 DIAGNOSIS — Z20822 Contact with and (suspected) exposure to covid-19: Secondary | ICD-10-CM | POA: Diagnosis present

## 2020-04-03 DIAGNOSIS — I2699 Other pulmonary embolism without acute cor pulmonale: Secondary | ICD-10-CM | POA: Diagnosis present

## 2020-04-03 DIAGNOSIS — I513 Intracardiac thrombosis, not elsewhere classified: Secondary | ICD-10-CM | POA: Diagnosis present

## 2020-04-03 DIAGNOSIS — F419 Anxiety disorder, unspecified: Secondary | ICD-10-CM | POA: Diagnosis present

## 2020-04-03 DIAGNOSIS — E871 Hypo-osmolality and hyponatremia: Secondary | ICD-10-CM | POA: Diagnosis present

## 2020-04-03 DIAGNOSIS — S2249XA Multiple fractures of ribs, unspecified side, initial encounter for closed fracture: Secondary | ICD-10-CM | POA: Diagnosis present

## 2020-04-03 DIAGNOSIS — I2782 Chronic pulmonary embolism: Secondary | ICD-10-CM | POA: Diagnosis not present

## 2020-04-03 DIAGNOSIS — Y92013 Bedroom of single-family (private) house as the place of occurrence of the external cause: Secondary | ICD-10-CM | POA: Diagnosis not present

## 2020-04-03 DIAGNOSIS — S2242XA Multiple fractures of ribs, left side, initial encounter for closed fracture: Principal | ICD-10-CM | POA: Diagnosis present

## 2020-04-03 DIAGNOSIS — Z881 Allergy status to other antibiotic agents status: Secondary | ICD-10-CM | POA: Diagnosis not present

## 2020-04-03 DIAGNOSIS — I4891 Unspecified atrial fibrillation: Secondary | ICD-10-CM | POA: Diagnosis not present

## 2020-04-03 DIAGNOSIS — I10 Essential (primary) hypertension: Secondary | ICD-10-CM | POA: Diagnosis present

## 2020-04-03 DIAGNOSIS — Z882 Allergy status to sulfonamides status: Secondary | ICD-10-CM | POA: Diagnosis not present

## 2020-04-03 DIAGNOSIS — Z79899 Other long term (current) drug therapy: Secondary | ICD-10-CM | POA: Diagnosis not present

## 2020-04-03 DIAGNOSIS — R296 Repeated falls: Secondary | ICD-10-CM | POA: Diagnosis present

## 2020-04-03 DIAGNOSIS — Z7982 Long term (current) use of aspirin: Secondary | ICD-10-CM

## 2020-04-03 DIAGNOSIS — Z8249 Family history of ischemic heart disease and other diseases of the circulatory system: Secondary | ICD-10-CM | POA: Diagnosis not present

## 2020-04-03 DIAGNOSIS — S2239XA Fracture of one rib, unspecified side, initial encounter for closed fracture: Secondary | ICD-10-CM | POA: Diagnosis present

## 2020-04-03 LAB — CBC WITH DIFFERENTIAL/PLATELET
Abs Immature Granulocytes: 0.04 10*3/uL (ref 0.00–0.07)
Basophils Absolute: 0.1 10*3/uL (ref 0.0–0.1)
Basophils Relative: 1 %
Eosinophils Absolute: 0.1 10*3/uL (ref 0.0–0.5)
Eosinophils Relative: 1 %
HCT: 40.8 % (ref 36.0–46.0)
Hemoglobin: 13.5 g/dL (ref 12.0–15.0)
Immature Granulocytes: 0 %
Lymphocytes Relative: 5 %
Lymphs Abs: 0.5 10*3/uL — ABNORMAL LOW (ref 0.7–4.0)
MCH: 30.1 pg (ref 26.0–34.0)
MCHC: 33.1 g/dL (ref 30.0–36.0)
MCV: 90.9 fL (ref 80.0–100.0)
Monocytes Absolute: 1 10*3/uL (ref 0.1–1.0)
Monocytes Relative: 11 %
Neutro Abs: 7.5 10*3/uL (ref 1.7–7.7)
Neutrophils Relative %: 82 %
Platelets: 150 10*3/uL (ref 150–400)
RBC: 4.49 MIL/uL (ref 3.87–5.11)
RDW: 17 % — ABNORMAL HIGH (ref 11.5–15.5)
WBC: 9.1 10*3/uL (ref 4.0–10.5)
nRBC: 0 % (ref 0.0–0.2)

## 2020-04-03 LAB — RESPIRATORY PANEL BY RT PCR (FLU A&B, COVID)
Influenza A by PCR: NEGATIVE
Influenza B by PCR: NEGATIVE
SARS Coronavirus 2 by RT PCR: NEGATIVE

## 2020-04-03 LAB — URINALYSIS, ROUTINE W REFLEX MICROSCOPIC
Bacteria, UA: NONE SEEN
Bilirubin Urine: NEGATIVE
Glucose, UA: NEGATIVE mg/dL
Hgb urine dipstick: NEGATIVE
Ketones, ur: NEGATIVE mg/dL
Nitrite: NEGATIVE
Protein, ur: 100 mg/dL — AB
Specific Gravity, Urine: 1.029 (ref 1.005–1.030)
pH: 5 (ref 5.0–8.0)

## 2020-04-03 LAB — MAGNESIUM: Magnesium: 2 mg/dL (ref 1.7–2.4)

## 2020-04-03 LAB — BASIC METABOLIC PANEL
Anion gap: 8 (ref 5–15)
BUN: 13 mg/dL (ref 8–23)
CO2: 27 mmol/L (ref 22–32)
Calcium: 9 mg/dL (ref 8.9–10.3)
Chloride: 98 mmol/L (ref 98–111)
Creatinine, Ser: 0.72 mg/dL (ref 0.44–1.00)
GFR calc Af Amer: >60 mL/min (ref >60)
GFR calc non Af Amer: >60 mL/min (ref >60)
Glucose, Bld: 125 mg/dL — ABNORMAL HIGH (ref 70–99)
Potassium: 3.4 mmol/L — ABNORMAL LOW (ref 3.5–5.1)
Sodium: 133 mmol/L — ABNORMAL LOW (ref 135–145)

## 2020-04-03 LAB — APTT: aPTT: 32 seconds (ref 24–36)

## 2020-04-03 LAB — HEPARIN LEVEL (UNFRACTIONATED)
Heparin Unfractionated: <0.1 IU/mL — ABNORMAL LOW (ref 0.30–0.70)
Heparin Unfractionated: 0.74 IU/mL — ABNORMAL HIGH (ref 0.30–0.70)

## 2020-04-03 MED ORDER — OXYCODONE HCL 5 MG PO TABS: 5.0000 mg | ORAL_TABLET | ORAL | Status: DC | PRN

## 2020-04-03 MED ORDER — METHOCARBAMOL 500 MG PO TABS: 500.0000 mg | ORAL_TABLET | Freq: Four times a day (QID) | ORAL | Status: DC | PRN

## 2020-04-03 MED ORDER — CALCIUM CARBONATE ANTACID 500 MG PO CHEW: 1.0000 | CHEWABLE_TABLET | Freq: Every day | ORAL | Status: DC | PRN

## 2020-04-03 MED ORDER — PANTOPRAZOLE SODIUM 40 MG PO TBEC
40.0000 mg | DELAYED_RELEASE_TABLET | Freq: Every day | ORAL | Status: DC
  Administered 2020-04-04 – 2020-04-06 (×3): 40 mg via ORAL
  Filled 2020-04-03 (×3): qty 1

## 2020-04-03 MED ORDER — HEPARIN BOLUS VIA INFUSION
2000.0000 [IU] | Freq: Once | INTRAVENOUS | Status: AC
  Administered 2020-04-03: 2000 [IU] via INTRAVENOUS
  Filled 2020-04-03: qty 2000

## 2020-04-03 MED ORDER — SODIUM CHLORIDE 0.9% FLUSH
3.0000 mL | Freq: Two times a day (BID) | INTRAVENOUS | Status: DC
  Administered 2020-04-04 – 2020-04-06 (×5): 3 mL via INTRAVENOUS

## 2020-04-03 MED ORDER — ACETAMINOPHEN 325 MG PO TABS: 650.0000 mg | ORAL_TABLET | Freq: Four times a day (QID) | ORAL | Status: DC | PRN

## 2020-04-03 MED ORDER — SODIUM CHLORIDE 1 G PO TABS
1.0000 g | ORAL_TABLET | Freq: Two times a day (BID) | ORAL | Status: DC
  Administered 2020-04-04 – 2020-04-06 (×5): 1 g via ORAL
  Filled 2020-04-03 (×5): qty 1

## 2020-04-03 MED ORDER — MORPHINE SULFATE (PF) 4 MG/ML IV SOLN
4.0000 mg | Freq: Once | INTRAVENOUS | Status: AC
  Administered 2020-04-03: 2 mg via INTRAVENOUS
  Filled 2020-04-03: qty 1

## 2020-04-03 MED ORDER — BUPROPION HCL ER (XL) 150 MG PO TB24
150.0000 mg | ORAL_TABLET | Freq: Every day | ORAL | Status: DC
  Administered 2020-04-04 – 2020-04-06 (×3): 150 mg via ORAL
  Filled 2020-04-03 (×3): qty 1

## 2020-04-03 MED ORDER — POLYETHYLENE GLYCOL 3350 17 G PO PACK: 17.0000 g | PACK | Freq: Every day | ORAL | Status: DC | PRN

## 2020-04-03 MED ORDER — HEPARIN (PORCINE) 25000 UT/250ML-% IV SOLN
1100.0000 [IU]/h | INTRAVENOUS | Status: DC
  Administered 2020-04-03: 1200 [IU]/h via INTRAVENOUS
  Administered 2020-04-04: 1100 [IU]/h via INTRAVENOUS
  Filled 2020-04-03 (×2): qty 250

## 2020-04-03 MED ORDER — ONDANSETRON HCL 4 MG/2ML IJ SOLN: 4.0000 mg | Freq: Four times a day (QID) | INTRAMUSCULAR | Status: DC | PRN

## 2020-04-03 MED ORDER — GABAPENTIN 100 MG PO CAPS
100.0000 mg | ORAL_CAPSULE | Freq: Three times a day (TID) | ORAL | Status: DC
  Administered 2020-04-03 – 2020-04-06 (×9): 100 mg via ORAL
  Filled 2020-04-03 (×9): qty 1

## 2020-04-03 MED ORDER — SODIUM CHLORIDE (PF) 0.9 % IJ SOLN
INTRAMUSCULAR | Status: AC
  Filled 2020-04-03: qty 50

## 2020-04-03 MED ORDER — ONDANSETRON HCL 4 MG PO TABS: 4.0000 mg | ORAL_TABLET | Freq: Four times a day (QID) | ORAL | Status: DC | PRN

## 2020-04-03 MED ORDER — DOCUSATE SODIUM 100 MG PO CAPS
100.0000 mg | ORAL_CAPSULE | Freq: Every day | ORAL | Status: DC
  Administered 2020-04-03 – 2020-04-05 (×3): 100 mg via ORAL
  Filled 2020-04-03 (×3): qty 1

## 2020-04-03 MED ORDER — HYDROMORPHONE HCL 1 MG/ML IJ SOLN
0.5000 mg | INTRAMUSCULAR | Status: DC | PRN
  Administered 2020-04-03: 0.5 mg via INTRAVENOUS
  Filled 2020-04-03: qty 1

## 2020-04-03 MED ORDER — PRAVASTATIN SODIUM 10 MG PO TABS
20.0000 mg | ORAL_TABLET | Freq: Every day | ORAL | Status: DC
  Administered 2020-04-03 – 2020-04-05 (×3): 20 mg via ORAL
  Filled 2020-04-03 (×3): qty 2

## 2020-04-03 MED ORDER — CHLORHEXIDINE GLUCONATE CLOTH 2 % EX PADS
6.0000 | MEDICATED_PAD | Freq: Every day | CUTANEOUS | Status: DC
  Administered 2020-04-04 – 2020-04-05 (×2): 6 via TOPICAL

## 2020-04-03 MED ORDER — IOHEXOL 300 MG/ML  SOLN
100.0000 mL | Freq: Once | INTRAMUSCULAR | Status: AC | PRN
  Administered 2020-04-03: 100 mL via INTRAVENOUS

## 2020-04-03 MED ORDER — HYDROCODONE-ACETAMINOPHEN 5-325 MG PO TABS: 1.0000 | ORAL_TABLET | ORAL | Status: DC | PRN

## 2020-04-03 MED ORDER — ACETAMINOPHEN 650 MG RE SUPP: 650.0000 mg | Freq: Four times a day (QID) | RECTAL | Status: DC | PRN

## 2020-04-03 MED ORDER — ATENOLOL 25 MG PO TABS
25.0000 mg | ORAL_TABLET | Freq: Every day | ORAL | Status: DC
  Administered 2020-04-04 – 2020-04-05 (×2): 25 mg via ORAL
  Filled 2020-04-03 (×2): qty 1

## 2020-04-03 MED ORDER — ACETAMINOPHEN 325 MG PO TABS
650.0000 mg | ORAL_TABLET | Freq: Four times a day (QID) | ORAL | Status: DC
  Administered 2020-04-03 – 2020-04-06 (×12): 650 mg via ORAL
  Filled 2020-04-03 (×12): qty 2

## 2020-04-03 MED ORDER — POTASSIUM CHLORIDE CRYS ER 20 MEQ PO TBCR
40.0000 meq | EXTENDED_RELEASE_TABLET | Freq: Once | ORAL | Status: AC
  Administered 2020-04-03: 40 meq via ORAL
  Filled 2020-04-03: qty 2

## 2020-04-03 MED ORDER — IBUPROFEN 200 MG PO TABS
800.0000 mg | ORAL_TABLET | Freq: Three times a day (TID) | ORAL | Status: DC
  Administered 2020-04-03 (×2): 800 mg via ORAL
  Filled 2020-04-03: qty 4
  Filled 2020-04-03: qty 1

## 2020-04-03 MED ORDER — ONDANSETRON HCL 4 MG/2ML IJ SOLN
4.0000 mg | Freq: Once | INTRAMUSCULAR | Status: AC
  Administered 2020-04-03: 4 mg via INTRAVENOUS
  Filled 2020-04-03: qty 2

## 2020-04-03 NOTE — ED Notes (Signed)
Report called to Gerarda Fraction, Therapist, sports at [facility]

## 2020-04-03 NOTE — Progress Notes (Signed)
ANTICOAGULATION CONSULT NOTE  Pharmacy Consult for heparin Indication: acute pulmonary embolus and left atrial thrombus  Allergies  Allergen Reactions  . Biaxin [Clarithromycin]     Reaction unknown  . Maxzide [Hydrochlorothiazide W-Triamterene]     Reaction unknown  . Penicillins Rash  . Sulfa Antibiotics Rash    Patient Measurements: Height: 5\' 8"  (172.7 cm) Weight: 89.8 kg (198 lb) IBW/kg (Calculated) : 63.9 Heparin Dosing Weight: 83 kg  Vital Signs: Temp: 98.4 F (36.9 C) (09/26 0141) Temp Source: Oral (09/26 0141) BP: 161/63 (09/26 0900) Pulse Rate: 63 (09/26 0900)  Labs: Recent Labs    04/03/20 0526  HGB 13.5  HCT 40.8  PLT 150  CREATININE 0.72    Estimated Creatinine Clearance: 60.3 mL/min (by C-G formula based on SCr of 0.72 mg/dL).  Assessment: Patient is an 86 y.o F with hx DVT/PE and afib with discharged summary on 03/15/20 indicating she was taken off Eliquis with this admission due to fall risk.  She presented to the ED on 9/26 s/p fall on 9/24 with c/o chest pain.  Pharmacy is consulted to start heparin drip for acute PE and atrial thrombus.  - 9/26 chest CTA: Large thrombus in the left atrial appendage. Lobar and segmental right lower lobe pulmonary emboli new from CTA 3 weeks ago. Acute left fifth, sixth, and seventh rib fractures - 9/26 head CT: no acute findings  Goal of Therapy:  Heparin level 0.3-0.7 units/ml Monitor platelets by anticoagulation protocol: Yes   Plan:  - heparin 2000 units IV bolus, then drip at 1200 units/hr - check 8 hr heparin level - monitor for s/sx bleeding  Dyllen Menning P 04/03/2020,9:52 AM

## 2020-04-03 NOTE — Progress Notes (Signed)
Pharmacy called to decrease pts heparin drip from 12 to 11 due to heparin level being slightly elevated.

## 2020-04-03 NOTE — Progress Notes (Signed)
ANTICOAGULATION CONSULT NOTE  Pharmacy Consult for heparin Indication: acute pulmonary embolus and left atrial thrombus  Allergies  Allergen Reactions  . Biaxin [Clarithromycin] Other (See Comments)    Reaction unknown  . Maxzide [Hydrochlorothiazide W-Triamterene] Other (See Comments)    Reaction unknown  . Penicillins Rash  . Sulfa Antibiotics Rash    Patient Measurements: Height: 5\' 8"  (172.7 cm) Weight: 89.8 kg (198 lb) IBW/kg (Calculated) : 63.9 Heparin Dosing Weight: 83 kg  Vital Signs: Temp: 98.5 F (36.9 C) (09/26 2033) Temp Source: Oral (09/26 2033) BP: 163/103 (09/26 2033) Pulse Rate: 92 (09/26 2033)  Labs: Recent Labs    04/03/20 0526 04/03/20 0956 04/03/20 2000  HGB 13.5  --   --   HCT 40.8  --   --   PLT 150  --   --   APTT  --  32  --   HEPARINUNFRC  --  <0.10* 0.74*  CREATININE 0.72  --   --     Estimated Creatinine Clearance: 60.3 mL/min (by C-G formula based on SCr of 0.72 mg/dL).  Assessment: Patient is an 86 y.o F with hx DVT/PE and afib with discharged summary on 03/15/20 indicating she was taken off Eliquis with this admission due to fall risk.  She presented to the ED on 9/26 s/p fall on 9/24 with c/o chest pain.  Pharmacy is consulted to start heparin drip for acute PE and atrial thrombus.  - 9/26 chest CTA: Large thrombus in the left atrial appendage. Lobar and segmental right lower lobe pulmonary emboli new from CTA 3 weeks ago. Acute left fifth, sixth, and seventh rib fractures - 9/26 head CT: no acute findings  Initial heparin level came back slightly supratherapeutic at 0.74, on 1200 units/hr. Drawn correctly, no infusion issues. No s/sx of bleeding per nursing.   Goal of Therapy:  Heparin level 0.3-0.7 units/ml Monitor platelets by anticoagulation protocol: Yes   Plan:  - Reduce heparin infusion to 1100 units/hr - Check 8 hr heparin level - Monitor for s/sx bleeding  Antonietta Jewel, PharmD, [place] Pharmacist  Phone:  [phone] 04/03/2020 9:41 PM  Please check AMION for all [place] phone numbers After 10:00 PM, call [place] [phone]

## 2020-04-03 NOTE — Progress Notes (Signed)
Full consult note to follow when patient arrives at [facility]-  Chart reviewed. Anticoagulated 86yo woman s/p ground level fall 2 days ago. Ct Head, chest, abdomen and pelvis performed with findings of: 1. Large thrombus in the left atrial appendage. 2. Lobar and segmental right lower lobe pulmonary emboli new from CTA 3 weeks ago. 3. Acute left fifth, sixth, and seventh rib fractures. Remote rib fractures on both sides. There is atelectasis and trace left pleural fluid. No pneumothorax. 4. 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 Recommend imaging follow-up to confirm Stability.  Recommend aggressive pulmonary toilet, multimodal pain control, and repeat CXR in AM. Trauma will follow after admission.

## 2020-04-03 NOTE — ED Notes (Signed)
Unsuccessful attempt to draw labs x3.

## 2020-04-03 NOTE — ED Notes (Signed)
[facility] present to get pt

## 2020-04-03 NOTE — ED Notes (Signed)
Went to go put patient in lobby when she mentioned that she has not been able to urinate. Patient did not mention this during triage. Patient states she hasn't been able to urinate since dinner.

## 2020-04-03 NOTE — ED Triage Notes (Signed)
Patient states she fell the day before yesterday and is complaining of left thoracic pain that radiates into her back. Patient denies any other complaints.

## 2020-04-03 NOTE — ED Notes (Signed)
[facility] dispatch notified for need of transport.  

## 2020-04-03 NOTE — ED Notes (Signed)
Report given to Rich ([facility])

## 2020-04-03 NOTE — ED Notes (Signed)
Patient transported to X-ray 

## 2020-04-03 NOTE — H&P (Signed)
History and Physical    LOURIE RETZ MVH:[id number] DOB: [birth year] DOA: 04/03/2020  PCP: Rusty Aus, MD  Patient coming from: Home  Chief Complaint: Left flank/chest pain after fall  HPI: Mary Rice is a 86 y.o. female with medical history significant of a fib.  Presents with left flank/chest pain after fall. She reports 2 days ago she was leaning over the edge of her bed to get to the [facility] and she fell forward into the radiator and hit her left side. She denies LOC or head injury. She remembers the entire fall. She called for help and was assisted to her bed. She was sore and took APAP, but it didn't help. As the pain worsened, she tried ultram, but that gave her hallucinations. The pain has continue worsen. Family became concerned, so she was brought to the ED. She denies any other alleviating or aggravating factors.   ED Course: CT imaging was acquired that showed acute fractures of the left 5th, 6th and 7th ribs. Imaging also showed new emboli in the RLL of the lung and a large left atrial appendage thrombus. EDP spoke with trauma who requested transfer to [place] spoke with cardiology who recommended echo exam and anticoagulation. TRH was called for admission.   Review of Systems: Reports palpitations. Denies CP, dyspnea, N, V, HA, syncopal episodes. Review of systems is otherwise negative for all not mentioned in HPI.   Past Medical History:  Diagnosis Date  . DVT (deep venous thrombosis) ([place])   . Dysrhythmia   . Hiatal hernia   . Hyperlipemia   . Hypertension   . Pulmonary embolism [facility])     Past Surgical History:  Procedure Laterality Date  . ABDOMINAL HYSTERECTOMY    . CATARACT EXTRACTION    . CHOLECYSTECTOMY    . ESOPHAGOGASTRODUODENOSCOPY N/A 08/19/2015   Procedure: ESOPHAGOGASTRODUODENOSCOPY (EGD);  Surgeon: Mauri Pole, MD;  Location: [facility] ENDOSCOPY;  Service: Endoscopy;  Laterality: N/A;  . KYPHOPLASTY N/A 02/26/2020   Procedure: KYPHOPLASTY T11;  Surgeon:  Hessie Knows, MD;  Location: [facility] ORS;  Service: Orthopedics;  Laterality: N/A;     reports that she has never smoked. She has never used smokeless tobacco. She reports that she does not drink alcohol and does not use drugs.  Allergies  Allergen Reactions  . Biaxin [Clarithromycin]     Reaction unknown  . Maxzide [Hydrochlorothiazide W-Triamterene]     Reaction unknown  . Penicillins Rash  . Sulfa Antibiotics Rash    Family History  Problem Relation Age of Onset  . Heart failure Mother     Prior to Admission medications   Medication Sig Start Date End Date Taking? Authorizing Provider  acetaminophen (TYLENOL) 650 MG CR tablet Take 1,300 mg by mouth every 8 (eight) hours as needed for pain.   Yes [provider]  apixaban (ELIQUIS) 5 MG TABS tablet Take 5 mg by mouth 2 (two) times daily.   Yes [provider]  aspirin EC 325 MG tablet Take 325 mg by mouth daily.   Yes [provider]  atenolol (TENORMIN) 25 MG tablet Take 25 mg by mouth daily. 08/09/15  Yes [provider]  buPROPion (WELLBUTRIN XL) 150 MG 24 hr tablet Take 150 mg by mouth daily.   Yes [provider]  Calcium Carb-Cholecalciferol (CALCIUM 600 + D PO) Take 1 tablet by mouth daily.   Yes [provider]  docusate sodium (COLACE) 100 MG capsule Take 100 mg by mouth at bedtime.  Yes [provider]  Multiple Vitamin (MULTIVITAMIN WITH MINERALS) TABS tablet Take 1 tablet by mouth daily.   Yes [provider]  pantoprazole (PROTONIX) 40 MG tablet Take 40 mg by mouth daily.   Yes [provider]  polyethylene glycol (MIRALAX / GLYCOLAX) 17 g packet Take 17 g by mouth daily as needed for mild constipation.   Yes [provider]  pravastatin (PRAVACHOL) 20 MG tablet Take 20 mg by mouth at bedtime.    Yes [provider]  sodium chloride 1 g tablet Take 1 g by mouth daily.   Yes [provider]  Ascorbic Acid (VITAMIN  C) 1000 MG tablet Take 1,000 mg by mouth daily.    [provider]  calcium carbonate (TUMS - DOSED IN MG ELEMENTAL CALCIUM) 500 MG chewable tablet Chew 1 tablet by mouth daily as needed for indigestion or heartburn.    [provider]  Cholecalciferol 50 MCG (2000 UT) TABS Take 2,000 Units by mouth daily.    [provider]  mirabegron ER (MYRBETRIQ) 25 MG TB24 tablet Take 1 tablet (25 mg total) by mouth daily. 03/24/20   Eugenie Filler, MD    Physical Exam: Vitals:   04/03/20 0800 04/03/20 0900 04/03/20 1000 04/03/20 1111  BP: (!) 166/70 (!) 161/63 (!) 171/78 (!) 156/90  Pulse: 78 63 74 70  Resp: 16 16 16 18   Temp:      TempSrc:      SpO2: 97% 98% 97% 94%  Weight:      Height:        General: 86 y.o. female resting in bed in NAD Eyes: PERRL, normal sclera ENMT: Nares patent w/o discharge, orophaynx clear, dentition normal, ears w/o discharge/lesions/ulcers Neck: Supple, trachea midline Cardiovascular: RRR, +S1, S2, no m/g/r, equal pulses throughout Respiratory: CTABL, no w/r/r, normal WOB GI: BS+, NDNT, no masses noted, no organomegaly noted MSK: No e/c/c Skin: No rashes, bruises, ulcerations noted Neuro: A&O x 3, no focal deficits Psyc: Appropriate interaction and affect, calm/cooperative  Labs on Admission: I have personally reviewed following labs and imaging studies  CBC: Recent Labs  Lab 04/03/20 0526  WBC 9.1  NEUTROABS 7.5  HGB 13.5  HCT 40.8  MCV 90.9  PLT 235   Basic Metabolic Panel: Recent Labs  Lab 04/03/20 0526  NA 133*  K 3.4*  CL 98  CO2 27  GLUCOSE 125*  BUN 13  CREATININE 0.72  CALCIUM 9.0   GFR: Estimated Creatinine Clearance: 60.3 mL/min (by C-G formula based on SCr of 0.72 mg/dL). Liver Function Tests: No results for input(s): AST, ALT, ALKPHOS, BILITOT, PROT, ALBUMIN in the last 168 hours. No results for input(s): LIPASE, AMYLASE in the last 168 hours. No results for input(s): AMMONIA in the last 168  hours. Coagulation Profile: No results for input(s): INR, PROTIME in the last 168 hours. Cardiac Enzymes: No results for input(s): CKTOTAL, CKMB, CKMBINDEX, TROPONINI in the last 168 hours. BNP (last 3 results) No results for input(s): PROBNP in the last 8760 hours. HbA1C: No results for input(s): HGBA1C in the last 72 hours. CBG: No results for input(s): GLUCAP in the last 168 hours. Lipid Profile: No results for input(s): CHOL, HDL, LDLCALC, TRIG, CHOLHDL, LDLDIRECT in the last 72 hours. Thyroid Function Tests: No results for input(s): TSH, T4TOTAL, FREET4, T3FREE, THYROIDAB in the last 72 hours. Anemia Panel: No results for input(s): VITAMINB12, FOLATE, FERRITIN, TIBC, IRON, RETICCTPCT in the last 72 hours. Urine analysis:    Component  Value Date/Time   COLORURINE YELLOW 04/03/2020 0210   APPEARANCEUR HAZY (A) 04/03/2020 0210   LABSPEC 1.029 04/03/2020 0210   PHURINE 5.0 04/03/2020 0210   GLUCOSEU NEGATIVE 04/03/2020 0210   HGBUR NEGATIVE 04/03/2020 0210   BILIRUBINUR NEGATIVE 04/03/2020 0210   KETONESUR NEGATIVE 04/03/2020 0210   PROTEINUR 100 (A) 04/03/2020 0210   NITRITE NEGATIVE 04/03/2020 0210   LEUKOCYTESUR MODERATE (A) 04/03/2020 0210    Radiological Exams on Admission: DG Ribs Unilateral W/Chest Left  Result Date: 04/03/2020 CLINICAL DATA:  Initial evaluation for acute left-sided thoracic and back pain status post recent fall. EXAM: LEFT RIBS AND CHEST - 3+ VIEW COMPARISON:  Prior radiograph from 03/21/2020. FINDINGS: Mild cardiomegaly, stable. Mediastinal silhouette within normal limits. Aortic atherosclerosis. Lungs are hypoinflated. Small left pleural effusion. Associated patchy bibasilar opacities favored to reflect atelectasis. No visible pneumothorax. Dedicated views of the left ribs were performed. There are acute mildly displaced fractures of the left fifth, 6, seventh, and likely eighth ribs. Thoracolumbar scoliosis with sequelae of prior thoracic augmentation  noted. IMPRESSION: 1. Acute mildly displaced fractures of the left fifth, sixth, seventh, and likely eighth ribs. 2. Small left pleural effusion with associated bibasilar atelectasis. 3.  Aortic Atherosclerosis (ICD10-I70.0). Electronically Signed   By: Jeannine Boga M.D.   On: 04/03/2020 04:39   CT Head Wo Contrast  Result Date: 04/03/2020 CLINICAL DATA:  Head trauma. EXAM: CT HEAD WITHOUT CONTRAST TECHNIQUE: Contiguous axial images were obtained from the base of the skull through the vertex without intravenous contrast. COMPARISON:  03/21/2020 FINDINGS: Brain: No evidence of acute large vascular territory infarction, hemorrhage, hydrocephalus, extra-axial collection or mass lesion/mass effect. Similar patchy white matter hypoattenuation, compatible with the sequela of chronic microvascular ischemic disease. Similar mild diffuse cerebral atrophy with ex vacuo ventricular dilation. Vascular: Calcific atherosclerosis. Skull: Normal. Negative for fracture or focal lesion. Sinuses/Orbits: No acute finding. Other: No mastoid effusions. IMPRESSION: No evidence of acute intracranial abnormality. Electronically Signed   By: Margaretha Sheffield MD   On: 04/03/2020 08:33   CT Chest W Contrast  Result Date: 04/03/2020 CLINICAL DATA:  Abdominal trauma.  Fall with back pain EXAM: CT CHEST, ABDOMEN, AND PELVIS WITH CONTRAST TECHNIQUE: Multidetector CT imaging of the chest, abdomen and pelvis was performed following the standard protocol during bolus administration of intravenous contrast. CONTRAST:  152mL OMNIPAQUE IOHEXOL 300 MG/ML  SOLN COMPARISON:  03/14/2020 FINDINGS: CT CHEST FINDINGS Cardiovascular: Prominent by biatrial size. Low-density filling defect is seen at the left atrial appendage measuring 2.6 cm. Newly seen right lower lobe emboli involving the lobar to segmental vessels. No pericardial effusion. Mediastinum/Nodes: No hematoma or pneumomediastinum. Lungs/Pleura: Atelectasis at the lung bases. Trace  left pleural effusion which is low-density appearing Musculoskeletal: Acute appearing lateral left fifth, sixth, seventh rib fractures. Remote anterior left fourth, fifth, and sixth rib fractures with callus. Remote anterior right rib fractures. CT ABDOMEN PELVIS FINDINGS Hepatobiliary: No hepatic injury or perihepatic hematoma. Gallbladder is absent Pancreas: Prominent fatty atrophy. Single calcification at the head which appears separate from the course of the distal CBD Spleen: No splenic injury or perisplenic hematoma. Adrenals/Urinary Tract: No adrenal hemorrhage or renal injury identified. Bladder is collapsed around a Foley catheter Stomach/Bowel: No evidence of injury. Distal colonic diverticulosis. Vascular/Lymphatic: Atheromatous calcifications. No acute vascular finding. Reproductive: Hysterectomy Other: Mixed density presacral mass which measures 4 cm in diameter. No visible involvement of the sacrum or adjacent nerve roots. There are areas of fatty density within the mass, suggesting myelolipoma - which occur  in this area. Musculoskeletal: Scoliosis and diffuse degenerative disease. Prior T11 compression fracture and cement augmentation. Critical Value/emergent results were called by telephone at the time of interpretation on 04/03/2020 at 8:43 am to Dr Gustavus Messing , who verbally acknowledged these results. IMPRESSION: 1. Large thrombus in the left atrial appendage. 2. Lobar and segmental right lower lobe pulmonary emboli new from CTA 3 weeks ago. 3. Acute left fifth, sixth, and seventh rib fractures. Remote rib fractures on both sides. There is atelectasis and trace left pleural fluid. No pneumothorax. 4. 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 Recommend imaging follow-up to confirm stability. Electronically Signed   By: Monte Fantasia M.D.   On: 04/03/2020 08:47   CT ABDOMEN PELVIS W CONTRAST  Result Date: 04/03/2020 CLINICAL DATA:  Abdominal trauma.  Fall  with back pain EXAM: CT CHEST, ABDOMEN, AND PELVIS WITH CONTRAST TECHNIQUE: Multidetector CT imaging of the chest, abdomen and pelvis was performed following the standard protocol during bolus administration of intravenous contrast. CONTRAST:  129mL OMNIPAQUE IOHEXOL 300 MG/ML  SOLN COMPARISON:  03/14/2020 FINDINGS: CT CHEST FINDINGS Cardiovascular: Prominent by biatrial size. Low-density filling defect is seen at the left atrial appendage measuring 2.6 cm. Newly seen right lower lobe emboli involving the lobar to segmental vessels. No pericardial effusion. Mediastinum/Nodes: No hematoma or pneumomediastinum. Lungs/Pleura: Atelectasis at the lung bases. Trace left pleural effusion which is low-density appearing Musculoskeletal: Acute appearing lateral left fifth, sixth, seventh rib fractures. Remote anterior left fourth, fifth, and sixth rib fractures with callus. Remote anterior right rib fractures. CT ABDOMEN PELVIS FINDINGS Hepatobiliary: No hepatic injury or perihepatic hematoma. Gallbladder is absent Pancreas: Prominent fatty atrophy. Single calcification at the head which appears separate from the course of the distal CBD Spleen: No splenic injury or perisplenic hematoma. Adrenals/Urinary Tract: No adrenal hemorrhage or renal injury identified. Bladder is collapsed around a Foley catheter Stomach/Bowel: No evidence of injury. Distal colonic diverticulosis. Vascular/Lymphatic: Atheromatous calcifications. No acute vascular finding. Reproductive: Hysterectomy Other: Mixed density presacral mass which measures 4 cm in diameter. No visible involvement of the sacrum or adjacent nerve roots. There are areas of fatty density within the mass, suggesting myelolipoma - which occur in this area. Musculoskeletal: Scoliosis and diffuse degenerative disease. Prior T11 compression fracture and cement augmentation. Critical Value/emergent results were called by telephone at the time of interpretation on 04/03/2020 at 8:43 am  to Dr Gustavus Messing , who verbally acknowledged these results. IMPRESSION: 1. Large thrombus in the left atrial appendage. 2. Lobar and segmental right lower lobe pulmonary emboli new from CTA 3 weeks ago. 3. Acute left fifth, sixth, and seventh rib fractures. Remote rib fractures on both sides. There is atelectasis and trace left pleural fluid. No pneumothorax. 4. 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 Recommend imaging follow-up to confirm stability. Electronically Signed   By: Monte Fantasia M.D.   On: 04/03/2020 08:47   Assessment/Plan Acute left rib fractures (5th, 6th, 7th) after fall Multiple falls     - admit to inpt at [facility] (trauma team request to transfer to [facility])     - PT/OT eval     - pulm toileting     - appreciate trauma team assistance  Acute RLL PE Left Atrial Appendage Thrombus     - Hx of afib not on full anticoagulation     - currently on heparin gtt; continue for now (pt/family have confirmed that they would like to go  to eliquis once transition to oral AC is made)     - check echo; dependent results, consult cardiology     - O2 as necessary  A fib     - continue atenolol     - pt/family are choosing eliquis for [facility] when transition is made from heparin gtt to [facility]  Chronic hyponatremia     - Na+ 133     - continue salt tablets  Hypokalemia     - replace, follow     - check Mg2+  HLD     - continue pravastatin  Anxiety     - continue welbutrin  DVT prophylaxis: heparin gtt  Code Status: DNI (confirmed with pt and dtr)  Family Communication: With dtr by phone and DIL at bedside  Consults called: Trauma and cardiology called by EDP  Admission status: Inpatient d/t need for heparin gtt and risk of cardiac collapse. Ongoing diagnostics not appropriate for outpt administration.  Status is: Inpatient  Remains inpatient appropriate because:Ongoing diagnostic testing needed not appropriate for outpatient work up   Dispo: The  patient is from: Home              Anticipated d/c is to: Home              Anticipated d/c date is: 3 days              Patient currently is not medically stable to d/c.  Jonnie Finner DO [facility]  If 7PM-7AM, please contact night-coverage [email or web address]  04/03/2020, 11:47 AM

## 2020-04-03 NOTE — Progress Notes (Signed)
Patient arrived to unit via [place] from [facility]. Patient is A&Ox4 but reported to have sundowners. Vitals WNL. Pt came with foley and foley care performed. Focused assessment also perform.   Pt came with personal belongings (phone, charger and clothes).Patient was oriented to room, bed in lowest position and bed alarm on.   Daughter Levada Dy was at bedside and wanted to set up a password for patient.    Sharin Mons, RN

## 2020-04-03 NOTE — ED Provider Notes (Signed)
Care assumed from Dr. Dina Rich.  At time of transfer of care, patient is awaiting results of CT imaging to look for extensive traumatic injuries after x-ray of the chest from fall 2 days ago showed 4 rib fractures.  CT is returned showing the rib fractures in question but no evidence of pneumothorax or pulmonary contusion, more concerning was bilateral pulmonary emboli new over the last few weeks as well as a large left atrial thrombus which is very concerning to the radiologist to call me.  I called Dr. Kae Heller with trauma surgery and due to the [facility] emboli and the atrial thrombus in the heart, she feels the patient would be best served with a medicine admission with likely cardiology involvement and they will see her in consultation for the rib fractures.  Dr. Windle Guard requested she be admitted to [facility] for their involvement as well.  We will call cardiology first and then likely medicine for admission to [facility].   9:48 AM Spoke with Dr. Debara Pickett with cardiology who reviewed the images.  He agrees that there is likely a left atrial thrombus however he feels that treatment with anticoagulation for the pulmonary emboli would be the best at this point.  He agreed that admission is reasonable so she can also get an echo.  We will call medicine service for admission to [facility] for further evaluation and management of multiple rib fractures, bilateral pulmonary emboli, and a left atrial thrombus requiring anticoagulation and further evaluation/work-up.      Dameer Speiser, Gwenyth Allegra, MD 04/03/20 1455

## 2020-04-03 NOTE — ED Provider Notes (Signed)
[facility] DEPT Provider Note   CSN: [id number] Arrival date & time: 04/03/20  0114     History Chief Complaint  Patient presents with  . Fall    Mary Rice is a 86 y.o. female.  HPI     This is an 86 year old female with a history of PE, hypertension, hyperlipidemia, recent pneumonia who presents with left flank and chest pain.  Patient reports that she had a fall on Friday.  She denies syncope.  She reports that she got up to use the bedside commode when she fell between the bed and the wall.  She hit a heating return.  Since that time she has had increasing pain over the left chest.  She denies shortness of breath.  She took Tylenol with minimal relief.  She was given a tramadol with some relief; however, she felt "loopy."  Patient had been on Eliquis but daughter held Eliquis since fall on Friday.  Caregiver at the bedside reports that she has recurrent issues with hyponatremia.  She has slept most of the day and has not had much urine output.  She denies abdominal pain, nausea, vomiting.  She denies hitting her head or losing consciousness.  Past Medical History:  Diagnosis Date  . DVT (deep venous thrombosis) ([place])   . Dysrhythmia   . Hiatal hernia   . Hyperlipemia   . Hypertension   . Pulmonary embolism [facility])     Patient Active Problem List   Diagnosis Date Noted  . Superficial thrombophlebitis of left upper extremity   . Acute metabolic encephalopathy   . Hypokalemia   . Hyponatremia   . Acute urinary retention   . Fever 03/21/2020  . AMS (altered mental status) 03/21/2020  . Generalized weakness 03/21/2020  . Thrombophlebitis arm 03/21/2020  . Pneumonia involving right lung 03/14/2020  . Hypoxia 03/14/2020  . Nausea and vomiting 03/14/2020  . Delirium 03/14/2020  . Atrial fibrillation ([place]) 10/10/2015  . SOB (shortness of breath) 10/10/2015  . Obesity 10/10/2015  . Hyperlipidemia 10/10/2015  . Essential hypertension  10/10/2015  . Food impaction of esophagus     Past Surgical History:  Procedure Laterality Date  . ABDOMINAL HYSTERECTOMY    . CATARACT EXTRACTION    . CHOLECYSTECTOMY    . ESOPHAGOGASTRODUODENOSCOPY N/A 08/19/2015   Procedure: ESOPHAGOGASTRODUODENOSCOPY (EGD);  Surgeon: Mauri Pole, MD;  Location: [facility] ENDOSCOPY;  Service: Endoscopy;  Laterality: N/A;  . KYPHOPLASTY N/A 02/26/2020   Procedure: KYPHOPLASTY T11;  Surgeon: Hessie Knows, MD;  Location: [facility] ORS;  Service: Orthopedics;  Laterality: N/A;     OB History   No obstetric history on file.     Family History  Problem Relation Age of Onset  . Heart failure Mother     Social History   Tobacco Use  . Smoking status: Never Smoker  . Smokeless tobacco: Never Used  Substance Use Topics  . Alcohol use: No  . Drug use: No    Home Medications Prior to Admission medications   Medication Sig Start Date End Date Taking? Authorizing Provider  acetaminophen (TYLENOL) 650 MG CR tablet Take 1,300 mg by mouth every 8 (eight) hours as needed for pain.   Yes [provider]  apixaban (ELIQUIS) 5 MG TABS tablet Take 5 mg by mouth 2 (two) times daily.   Yes [provider]  aspirin EC 325 MG tablet Take 325 mg by mouth daily.   Yes [provider]  atenolol (TENORMIN) 25 MG tablet  Take 25 mg by mouth daily. 08/09/15  Yes [provider]  buPROPion (WELLBUTRIN XL) 150 MG 24 hr tablet Take 150 mg by mouth daily.   Yes [provider]  Calcium Carb-Cholecalciferol (CALCIUM 600 + D PO) Take 1 tablet by mouth daily.   Yes [provider]  docusate sodium (COLACE) 100 MG capsule Take 100 mg by mouth at bedtime.   Yes [provider]  Multiple Vitamin (MULTIVITAMIN WITH MINERALS) TABS tablet Take 1 tablet by mouth daily.   Yes [provider]  pantoprazole (PROTONIX) 40 MG tablet Take 40 mg by mouth daily.   Yes [provider]  polyethylene glycol (MIRALAX /  GLYCOLAX) 17 g packet Take 17 g by mouth daily as needed for mild constipation.   Yes [provider]  pravastatin (PRAVACHOL) 20 MG tablet Take 20 mg by mouth at bedtime.    Yes [provider]  sodium chloride 1 g tablet Take 1 g by mouth daily.   Yes [provider]  Ascorbic Acid (VITAMIN C) 1000 MG tablet Take 1,000 mg by mouth daily.    [provider]  calcium carbonate (TUMS - DOSED IN MG ELEMENTAL CALCIUM) 500 MG chewable tablet Chew 1 tablet by mouth daily as needed for indigestion or heartburn.    [provider]  Cholecalciferol 50 MCG (2000 UT) TABS Take 2,000 Units by mouth daily.    [provider]  mirabegron ER (MYRBETRIQ) 25 MG TB24 tablet Take 1 tablet (25 mg total) by mouth daily. 03/24/20   Eugenie Filler, MD    Allergies    Biaxin [clarithromycin], Maxzide [hydrochlorothiazide w-triamterene], Penicillins, and Sulfa antibiotics  Review of Systems   Review of Systems  Constitutional: Negative for fever.  Respiratory: Negative for shortness of breath.   Cardiovascular: Positive for chest pain.  Gastrointestinal: Negative for abdominal pain, nausea and vomiting.  Genitourinary: Positive for difficulty urinating and flank pain.  Neurological: Negative for dizziness, weakness and light-headedness.  All other systems reviewed and are negative.   Physical Exam Updated Vital Signs BP (!) 169/69 (BP Location: Right Arm)   Pulse 68   Temp 98.4 F (36.9 C) (Oral)   Resp 16   Ht 1.727 m (5\' 8" )   Wt 89.8 kg   SpO2 97%   BMI 30.11 kg/m   Physical Exam Vitals and nursing note reviewed.  Constitutional:      Appearance: She is well-developed.     Comments: Overweight, nontoxic-appearing, no acute distress  HENT:     Head: Normocephalic and atraumatic.     Mouth/Throat:     Mouth: Mucous membranes are moist.  Eyes:     Pupils: Pupils are equal, round, and reactive to light.  Cardiovascular:     Rate and  Rhythm: Normal rate and regular rhythm.     Heart sounds: Normal heart sounds.  Pulmonary:     Effort: Pulmonary effort is normal. No respiratory distress.     Breath sounds: No wheezing.     Comments: Left chest wall tenderness palpation just under the left breast, no crepitus or overlying skin changes noted Chest:     Chest wall: Tenderness present.  Abdominal:     General: Bowel sounds are normal.     Palpations: Abdomen is soft.  Musculoskeletal:     Cervical back: Neck supple.     Right lower leg: No edema.     Left lower leg: No edema.     Comments: Normal range  of motion bilateral hips and knees  Skin:    General: Skin is warm and dry.     Comments: Bruising noted left upper extremity  Neurological:     Mental Status: She is alert and oriented to person, place, and time.  Psychiatric:        Mood and Affect: Mood normal.     ED Results / Procedures / Treatments   Labs (all labs ordered are listed, but only abnormal results are displayed) Labs Reviewed  URINALYSIS, ROUTINE W REFLEX MICROSCOPIC - Abnormal; Notable for the following components:      Result Value   APPearance HAZY (*)    Protein, ur 100 (*)    Leukocytes,Ua MODERATE (*)    All other components within normal limits  CBC WITH DIFFERENTIAL/PLATELET - Abnormal; Notable for the following components:   RDW 17.0 (*)    Lymphs Abs 0.5 (*)    All other components within normal limits  BASIC METABOLIC PANEL - Abnormal; Notable for the following components:   Sodium 133 (*)    Potassium 3.4 (*)    Glucose, Bld 125 (*)    All other components within normal limits  URINE CULTURE  RESPIRATORY PANEL BY RT PCR (FLU A&B, COVID)  URINE CULTURE    EKG None  Radiology DG Ribs Unilateral W/Chest Left  Result Date: 04/03/2020 CLINICAL DATA:  Initial evaluation for acute left-sided thoracic and back pain status post recent fall. EXAM: LEFT RIBS AND CHEST - 3+ VIEW COMPARISON:  Prior radiograph from 03/21/2020.  FINDINGS: Mild cardiomegaly, stable. Mediastinal silhouette within normal limits. Aortic atherosclerosis. Lungs are hypoinflated. Small left pleural effusion. Associated patchy bibasilar opacities favored to reflect atelectasis. No visible pneumothorax. Dedicated views of the left ribs were performed. There are acute mildly displaced fractures of the left fifth, 6, seventh, and likely eighth ribs. Thoracolumbar scoliosis with sequelae of prior thoracic augmentation noted. IMPRESSION: 1. Acute mildly displaced fractures of the left fifth, sixth, seventh, and likely eighth ribs. 2. Small left pleural effusion with associated bibasilar atelectasis. 3.  Aortic Atherosclerosis (ICD10-I70.0). Electronically Signed   By: Jeannine Boga M.D.   On: 04/03/2020 04:39    Procedures Procedures (including critical care time)  Medications Ordered in ED Medications  morphine 4 MG/ML injection 4 mg (2 mg Intravenous Given 04/03/20 0617)  ondansetron (ZOFRAN) injection 4 mg (4 mg Intravenous Given 04/03/20 2355)    ED Course  I have reviewed the triage vital signs and the nursing notes.  Pertinent labs & imaging results that were available during my care of the patient were reviewed by me and considered in my medical decision making (see chart for details).  Clinical Course as of Apr 04 651  Sun Apr 03, 2020  0630 Spoke with Dr. Marcello Moores regarding evaluation for admission.  Feel this patient meets criteria for pain control and pulmonary toilet given her age and number of rib fractures in the presence of a pleural effusion.  ?need for CT given new effusion and eliquis.  Dr. Marcello Moores indicates that she can place orders and admit the patient but requests consultation with trauma.  Will discuss with trauma surgeon at [facility].   [CH]  [id number] Spoke with Dr. Barry Dienes.  Given that the patient was on Eliquis, she recommends full trauma scan of head, chest and abdomen given high risk for bleed even though she is 2 days  out.  Will order scans and plan to touch base with trauma surgery.  If isolated rib fractures,  patient may be best served on medicine.  However, if other traumatic injuries are present, she would be admitted to the trauma service at [facility].   [CH]  [id number] Scans ordered.  Patient updated with plan.  Dr. Marcello Moores at the bedside for evaluation.   [CH]    Clinical Course User Index [CH] Callee Rohrig, Barbette Hair, MD   MDM Rules/Calculators/A&P                           Patient presents following a fall on Friday.  Reports that she lost her balance.  She is overall nontoxic and vital signs are notable for elevated blood pressure.  This may be related to pain.  Pulse rate is 68.  She is satting 97% on room air.  She has point tenderness palpation over the left lower rib cage.  No overlying skin changes.  She fell greater than 24 hours ago and is awake and alert.  She is hemodynamically stable.  Will obtain lab work and plain films to start.  X-rays notable for likely 4 rib fractures with a small pleural effusion.  The effusion was not present on last chest x-ray when she was admitted to the hospital.  Question whether this may be blood versus residual infection.  Patient given pain control.  Concern for need for aggressive pain control and pulmonary toilet.  See clinical course above.  Plan for full trauma evaluation given that the patient was on Eliquis at the request of trauma surgery.  She will need admission to the hospital.  Depending on scans, may need trauma versus hospitalist admission.  Final Clinical Impression(s) / ED Diagnoses Final diagnoses:  Fall, initial encounter  Closed fracture of multiple ribs of left side, initial encounter    Rx / DC Orders ED Discharge Orders    None       Saben Donigan, Barbette Hair, MD 04/03/20 [phone]

## 2020-04-04 ENCOUNTER — Inpatient Hospital Stay (HOSPITAL_COMMUNITY): Payer: Medicare Other

## 2020-04-04 DIAGNOSIS — I482 Chronic atrial fibrillation, unspecified: Secondary | ICD-10-CM

## 2020-04-04 DIAGNOSIS — I4891 Unspecified atrial fibrillation: Secondary | ICD-10-CM

## 2020-04-04 DIAGNOSIS — I513 Intracardiac thrombosis, not elsewhere classified: Secondary | ICD-10-CM

## 2020-04-04 DIAGNOSIS — W19XXXA Unspecified fall, initial encounter: Secondary | ICD-10-CM

## 2020-04-04 DIAGNOSIS — I2699 Other pulmonary embolism without acute cor pulmonale: Secondary | ICD-10-CM

## 2020-04-04 LAB — CBC
HCT: 41 % (ref 36.0–46.0)
Hemoglobin: 13.2 g/dL (ref 12.0–15.0)
MCH: 29.7 pg (ref 26.0–34.0)
MCHC: 32.2 g/dL (ref 30.0–36.0)
MCV: 92.3 fL (ref 80.0–100.0)
Platelets: 185 10*3/uL (ref 150–400)
RBC: 4.44 MIL/uL (ref 3.87–5.11)
RDW: 17.1 % — ABNORMAL HIGH (ref 11.5–15.5)
WBC: 10.3 10*3/uL (ref 4.0–10.5)
nRBC: 0 % (ref 0.0–0.2)

## 2020-04-04 LAB — COMPREHENSIVE METABOLIC PANEL
ALT: 16 U/L (ref 0–44)
AST: 20 U/L (ref 15–41)
Albumin: 3 g/dL — ABNORMAL LOW (ref 3.5–5.0)
Alkaline Phosphatase: 62 U/L (ref 38–126)
Anion gap: 11 (ref 5–15)
BUN: 18 mg/dL (ref 8–23)
CO2: 20 mmol/L — ABNORMAL LOW (ref 22–32)
Calcium: 8.4 mg/dL — ABNORMAL LOW (ref 8.9–10.3)
Chloride: 99 mmol/L (ref 98–111)
Creatinine, Ser: 0.91 mg/dL (ref 0.44–1.00)
GFR calc Af Amer: >60 mL/min (ref >60)
GFR calc non Af Amer: 58 mL/min — ABNORMAL LOW (ref >60)
Glucose, Bld: 112 mg/dL — ABNORMAL HIGH (ref 70–99)
Potassium: 4.5 mmol/L (ref 3.5–5.1)
Sodium: 130 mmol/L — ABNORMAL LOW (ref 135–145)
Total Bilirubin: 0.8 mg/dL (ref 0.3–1.2)
Total Protein: 5.6 g/dL — ABNORMAL LOW (ref 6.5–8.1)

## 2020-04-04 LAB — URINE CULTURE

## 2020-04-04 LAB — ECHOCARDIOGRAM COMPLETE
Height: 68 in
S' Lateral: 3.9 cm
Single Plane A4C EF: 60.7 %
Weight: 3168 oz

## 2020-04-04 LAB — MAGNESIUM: Magnesium: 1.9 mg/dL (ref 1.7–2.4)

## 2020-04-04 LAB — MRSA PCR SCREENING: MRSA by PCR: NEGATIVE

## 2020-04-04 MED ORDER — METHOCARBAMOL 500 MG PO TABS
500.0000 mg | ORAL_TABLET | Freq: Four times a day (QID) | ORAL | Status: DC
  Administered 2020-04-04 – 2020-04-06 (×9): 500 mg via ORAL
  Filled 2020-04-04 (×9): qty 1

## 2020-04-04 MED ORDER — POTASSIUM CHLORIDE CRYS ER 20 MEQ PO TBCR
40.0000 meq | EXTENDED_RELEASE_TABLET | Freq: Once | ORAL | Status: AC
  Administered 2020-04-04: 40 meq via ORAL
  Filled 2020-04-04: qty 2

## 2020-04-04 MED ORDER — OXYCODONE HCL 5 MG PO TABS
5.0000 mg | ORAL_TABLET | ORAL | Status: DC | PRN
  Administered 2020-04-05 (×3): 5 mg via ORAL
  Filled 2020-04-04 (×3): qty 1

## 2020-04-04 MED ORDER — ENOXAPARIN SODIUM 100 MG/ML ~~LOC~~ SOLN
1.0000 mg/kg | Freq: Two times a day (BID) | SUBCUTANEOUS | Status: DC
  Administered 2020-04-04: 90 mg via SUBCUTANEOUS
  Filled 2020-04-04: qty 1

## 2020-04-04 MED ORDER — LIDOCAINE 5 % EX PTCH
1.0000 | MEDICATED_PATCH | CUTANEOUS | Status: DC
  Administered 2020-04-04 – 2020-04-06 (×3): 1 via TRANSDERMAL
  Filled 2020-04-04 (×3): qty 1

## 2020-04-04 MED ORDER — PERFLUTREN LIPID MICROSPHERE
1.0000 mL | INTRAVENOUS | Status: AC | PRN
  Administered 2020-04-04: 2 mL via INTRAVENOUS
  Filled 2020-04-04: qty 10

## 2020-04-04 MED ORDER — POLYETHYLENE GLYCOL 3350 17 G PO PACK
17.0000 g | PACK | Freq: Every day | ORAL | Status: DC
  Administered 2020-04-04 – 2020-04-06 (×3): 17 g via ORAL
  Filled 2020-04-04 (×3): qty 1

## 2020-04-04 NOTE — Progress Notes (Signed)
  Echocardiogram 2D Echocardiogram has been performed.  Mary Rice 04/04/2020, 11:57 AM

## 2020-04-04 NOTE — TOC CAGE-AID Note (Signed)
Transition of Care [facility]) - CAGE-AID Screening   Patient Details  Name: Mary Rice MRN: [id number] Date of Birth: 1932-11-08  Transition of [facility]) CM/SW Contact:    Emeterio Reeve, [place] Phone Number: 04/04/2020, 3:41 PM   Clinical Narrative:  CSW met with pt at bedside. CSW introduced self and explained her role at the hospital.  PT denies alcohol use and substance use. Pt did not need any resources at this time.    CAGE-AID Screening:    Have You Ever Felt You Ought to Cut Down on Your Drinking or Drug Use?: No Have People Annoyed You By Critizing Your Drinking Or Drug Use?: No Have You Felt Bad Or Guilty About Your Drinking Or Drug Use?: No Have You Ever Had a Drink or Used Drugs First Thing In The Morning to Steady Your Nerves or to Get Rid of a Hangover?: No CAGE-AID Score: 0  Substance Abuse Education Offered: Yes    Blima Ledger, [place] Social Worker [phone]

## 2020-04-04 NOTE — Consult Note (Addendum)
Cardiology Consultation:   Patient ID: Mary Rice MRN: [id number]; DOB: 06-13-1933  Admit date: 04/03/2020 Date of Consult: 04/04/2020  Primary Care Provider: Rusty Aus, MD [facility] Cardiologist:  Dr. Rockey Situ (2017) [place] Electrophysiologist:  None    Patient Profile:   Mary Rice is a 86 y.o. female with a hx of permanent Afib, PE/DVT, HTN, and HLD who is being seen today for the evaluation of LAA thrombus at the request of Dr. Tyrell Antonio.  History of Present Illness:   [name] followed with Dr. Rockey Situ in 2017 during her initial Afib diagnosis. She was placed on AC at that time and did not want to pursue antiarrhythmic or electrical cardioversion. She had COVID-19 infection 03/2019, did not require hospitalization. She has a history of DVT and bilateral PE following knee surgery (2007). She fell a "few months ago" and was taken off anticoagulation. She fell 03/2019 on her back in a basement. She fell again 01/2020 hitting her back and head. Anticoagulation was stopped. She underwent kyphoplasty for a T11 compression fracture in 02/2020. She presented 03/14/20 with confusion found to have PNA. She was treated and discharged on 03/15/20. She presented 03/21/20 with AMS found to have fever, weakness, and left upper extremity pain and swelling. Negative for DVT, noted to have superficial phlebitis. Underlying dementia and sundowning complicated AMS picture. Discharge 03/23/20.   She presented 04/03/20 after a fall when leaning over her bed to read the bedside commode. She fell forward and hit her head on the radiator. She remembers the fall and denies chest pain, palpitations, and dizziness. In the ER, found to have fractures of the left 5th, 6th, and 7th ribs. Imaging also showed new PE in the RLL and large left atrial appendage. Cardiology was consulted and recommended echocardiogram and anticoagulation.   Echocardiogram revealed preserved EF and moderate TR.   She has no  complaints other than sore ribs. She denies chest pain, shortness of breath, orthopnea, and palpitations. She does have intermittent lower extremity edema, but does not elevate her legs at home.    Past Medical History:  Diagnosis Date  . DVT (deep venous thrombosis) ([place])   . Dysrhythmia   . Hiatal hernia   . Hyperlipemia   . Hypertension   . Pulmonary embolism [facility])     Past Surgical History:  Procedure Laterality Date  . ABDOMINAL HYSTERECTOMY    . CATARACT EXTRACTION    . CHOLECYSTECTOMY    . ESOPHAGOGASTRODUODENOSCOPY N/A 08/19/2015   Procedure: ESOPHAGOGASTRODUODENOSCOPY (EGD);  Surgeon: Mauri Pole, MD;  Location: [facility] ENDOSCOPY;  Service: Endoscopy;  Laterality: N/A;  . KYPHOPLASTY N/A 02/26/2020   Procedure: KYPHOPLASTY T11;  Surgeon: Hessie Knows, MD;  Location: [facility] ORS;  Service: Orthopedics;  Laterality: N/A;     Home Medications:  Prior to Admission medications   Medication Sig Start Date End Date Taking? Authorizing Provider  acetaminophen (TYLENOL) 650 MG CR tablet Take 650 mg by mouth every 4 (four) hours as needed for pain.    Yes [provider]  Ascorbic Acid (VITAMIN C) 250 MG CHEW Chew 750 mg by mouth daily.    Yes [provider]  aspirin EC 325 MG tablet Take 325 mg by mouth daily.   Yes [provider]  atenolol (TENORMIN) 25 MG tablet Take 25 mg by mouth daily. 08/09/15  Yes [provider]  buPROPion (WELLBUTRIN XL) 150 MG 24 hr tablet Take 150 mg by mouth at bedtime.    Yes [provider]  calcium carbonate (TUMS - DOSED IN MG ELEMENTAL CALCIUM) 500 MG chewable tablet Chew 1 tablet by mouth daily as needed for indigestion or heartburn.   Yes [provider]  Calcium-Vitamin D-Vitamin K (CALCIUM + D + K PO) Take 1 tablet by mouth 2 (two) times daily.   Yes [provider]  Cholecalciferol 50 MCG (2000 UT) TABS Take 2,000 Units by mouth daily.   Yes [provider]  docusate sodium  (COLACE) 100 MG capsule Take 300 mg by mouth at bedtime.    Yes [provider]  Multiple Vitamin (MULTIVITAMIN WITH MINERALS) TABS tablet Take 1 tablet by mouth daily.   Yes [provider]  pantoprazole (PROTONIX) 40 MG tablet Take 40 mg by mouth at bedtime.    Yes [provider]  polyethylene glycol (MIRALAX / GLYCOLAX) 17 g packet Take 17 g by mouth daily.    Yes [provider]  pravastatin (PRAVACHOL) 20 MG tablet Take 20 mg by mouth at bedtime.    Yes [provider]  sodium chloride 1 g tablet Take 1 g by mouth 2 (two) times daily with a meal.    Yes [provider]  mirabegron ER (MYRBETRIQ) 25 MG TB24 tablet Take 1 tablet (25 mg total) by mouth daily. Patient not taking: Reported on 04/03/2020 03/24/20   Eugenie Filler, MD    Inpatient Medications: Scheduled Meds: . acetaminophen  650 mg Oral Q6H  . atenolol  25 mg Oral Daily  . buPROPion  150 mg Oral Daily  . Chlorhexidine Gluconate Cloth  6 each Topical Daily  . docusate sodium  100 mg Oral QHS  . gabapentin  100 mg Oral TID  . lidocaine  1 patch Transdermal Q24H  . methocarbamol  500 mg Oral Q6H  . pantoprazole  40 mg Oral Daily  . polyethylene glycol  17 g Oral Daily  . pravastatin  20 mg Oral QHS  . sodium chloride flush  3 mL Intravenous Q12H  . sodium chloride  1 g Oral BID WC   Continuous Infusions: . heparin 1,100 Units/hr (04/04/20 0555)   PRN Meds: acetaminophen **OR** acetaminophen, calcium carbonate, HYDROmorphone (DILAUDID) injection, ondansetron **OR** ondansetron (ZOFRAN) IV, oxyCODONE, perflutren lipid microspheres (DEFINITY) IV suspension  Allergies:    Allergies  Allergen Reactions  . Biaxin [Clarithromycin] Other (See Comments)    Reaction unknown  . Maxzide [Hydrochlorothiazide W-Triamterene] Other (See Comments)    Reaction unknown  . Penicillins Rash  . Sulfa Antibiotics Rash    Social History:   Social History   Socioeconomic  History  . Marital status: Married    Spouse name: Not on file  . Number of children: Not on file  . Years of education: Not on file  . Highest education level: Not on file  Occupational History  . Not on file  Tobacco Use  . Smoking status: Never Smoker  . Smokeless tobacco: Never Used  Substance and Sexual Activity  . Alcohol use: No  . Drug use: No  . Sexual activity: Not on file  Other Topics Concern  . Not on file  Social History Narrative  . Not on file   Social Determinants of Health   Financial Resource Strain:   . Difficulty of Paying Living Expenses: Not on file  Food Insecurity:   . Worried About Charity fundraiser in the Last Year: Not on file  . Ran Out of Food in the Last Year: Not on file  Transportation Needs:   . Film/video editor (Medical): Not on file  . Lack of Transportation (Non-Medical): Not on file  Physical Activity:   . Days of Exercise per Week: Not on file  . Minutes of Exercise per Session: Not on file  Stress:   . Feeling of Stress : Not on file  Social Connections:   . Frequency of Communication with Friends and Family: Not on file  . Frequency of Social Gatherings with Friends and Family: Not on file  . Attends Religious Services: Not on file  . Active Member of Clubs or Organizations: Not on file  . Attends Archivist Meetings: Not on file  . Marital Status: Not on file  Intimate Partner Violence:   . Fear of Current or Ex-Partner: Not on file  . Emotionally Abused: Not on file  . Physically Abused: Not on file  . Sexually Abused: Not on file    Family History:    Family History  Problem Relation Age of Onset  . Heart failure Mother      ROS:  Please see the history of present illness.   All other ROS reviewed and negative.     Physical Exam/Data:   Vitals:   04/03/20 2314 04/04/20 0443 04/04/20 0812 04/04/20 1204  BP: (!) 103/56 (!) 123/48 (!) 134/58 (!) 134/44  Pulse:  (!) 57 66 71  Resp:  17 15 19     Temp: 98.2 F (36.8 C) 97.8 F (36.6 C) 98.2 F (36.8 C) 98.7 F (37.1 C)  TempSrc: Oral Oral Oral Oral  SpO2:  96% 91% 95%  Weight:      Height:        Intake/Output Summary (Last 24 hours) at 04/04/2020 1310 Last data filed at 04/04/2020 0430 Gross per 24 hour  Intake 202.33 ml  Output 150 ml  Net 52.33 ml   Last 3 Weights 04/03/2020 03/21/2020 03/21/2020  Weight (lbs) 198 lb 198 lb 198 lb  Weight (kg) 89.812 kg 89.812 kg 89.812 kg     Body mass index is 30.11 kg/m.  General:  Elderly female in nad Neck: no JVD Endocrine:  No thryomegaly Vascular: No carotid bruits Cardiac:  Irregular rhythm, regular rate Lungs:  clear to auscultation bilaterally, no wheezing, rhonchi or rales  Abd: soft, nontender, no hepatomegaly  Ext: 1+ B LE Musculoskeletal:  No deformities, BUE and BLE strength normal and equal Skin: warm and dry  Neuro:  CNs 2-12 intact, no focal abnormalities noted Psych:  Normal affect   EKG:  The EKG was personally reviewed and demonstrates: N/A Telemetry:  Telemetry was personally reviewed and demonstrates:  Afib in the 60s  Relevant CV Studies:  Echo 04/04/20: 1. Left ventricular ejection fraction, by estimation, is 65 to 70%. The  left ventricle has normal function. The left ventricle has no regional  wall motion abnormalities. Left ventricular diastolic function could not  be evaluated.  2. Right ventricular systolic function is normal. The right ventricular  size is normal. There is mildly elevated pulmonary artery systolic  pressure. The estimated right ventricular systolic pressure is 76.2 mmHg.  3. The mitral valve is normal in structure. No evidence of mitral valve  regurgitation. No evidence of mitral stenosis.  4. Tricuspid valve regurgitation is moderate.  5. The aortic valve is normal in structure. There is moderate  calcification of the aortic valve. There is moderate thickening of the  aortic valve. Aortic valve regurgitation is  trivial. Mild to moderate  aortic valve  sclerosis/calcification is present,  without any evidence of aortic stenosis.  6. The inferior vena cava is normal in size with greater than 50%  respiratory variability, suggesting right atrial pressure of 3 mmHg.   Laboratory Data:  High Sensitivity Troponin:  No results for input(s): TROPONINIHS in the last 720 hours.   Chemistry Recent Labs  Lab 04/03/20 0526  NA 133*  K 3.4*  CL 98  CO2 27  GLUCOSE 125*  BUN 13  CREATININE 0.72  CALCIUM 9.0  GFRNONAA >60  GFRAA >60  ANIONGAP 8    No results for input(s): PROT, ALBUMIN, AST, ALT, ALKPHOS, BILITOT in the last 168 hours. Hematology Recent Labs  Lab 04/03/20 0526  WBC 9.1  RBC 4.49  HGB 13.5  HCT 40.8  MCV 90.9  MCH 30.1  MCHC 33.1  RDW 17.0*  PLT 150   BNPNo results for input(s): BNP, PROBNP in the last 168 hours.  DDimer No results for input(s): DDIMER in the last 168 hours.   Radiology/Studies:  DG Ribs Unilateral W/Chest Left  Result Date: 04/03/2020 CLINICAL DATA:  Initial evaluation for acute left-sided thoracic and back pain status post recent fall. EXAM: LEFT RIBS AND CHEST - 3+ VIEW COMPARISON:  Prior radiograph from 03/21/2020. FINDINGS: Mild cardiomegaly, stable. Mediastinal silhouette within normal limits. Aortic atherosclerosis. Lungs are hypoinflated. Small left pleural effusion. Associated patchy bibasilar opacities favored to reflect atelectasis. No visible pneumothorax. Dedicated views of the left ribs were performed. There are acute mildly displaced fractures of the left fifth, 6, seventh, and likely eighth ribs. Thoracolumbar scoliosis with sequelae of prior thoracic augmentation noted. IMPRESSION: 1. Acute mildly displaced fractures of the left fifth, sixth, seventh, and likely eighth ribs. 2. Small left pleural effusion with associated bibasilar atelectasis. 3.  Aortic Atherosclerosis (ICD10-I70.0). Electronically Signed   By: Jeannine Boga M.D.   On:  04/03/2020 04:39   CT Head Wo Contrast  Result Date: 04/03/2020 CLINICAL DATA:  Head trauma. EXAM: CT HEAD WITHOUT CONTRAST TECHNIQUE: Contiguous axial images were obtained from the base of the skull through the vertex without intravenous contrast. COMPARISON:  03/21/2020 FINDINGS: Brain: No evidence of acute large vascular territory infarction, hemorrhage, hydrocephalus, extra-axial collection or mass lesion/mass effect. Similar patchy white matter hypoattenuation, compatible with the sequela of chronic microvascular ischemic disease. Similar mild diffuse cerebral atrophy with ex vacuo ventricular dilation. Vascular: Calcific atherosclerosis. Skull: Normal. Negative for fracture or focal lesion. Sinuses/Orbits: No acute finding. Other: No mastoid effusions. IMPRESSION: No evidence of acute intracranial abnormality. Electronically Signed   By: Margaretha Sheffield MD   On: 04/03/2020 08:33   CT Chest W Contrast  Result Date: 04/03/2020 CLINICAL DATA:  Abdominal trauma.  Fall with back pain EXAM: CT CHEST, ABDOMEN, AND PELVIS WITH CONTRAST TECHNIQUE: Multidetector CT imaging of the chest, abdomen and pelvis was performed following the standard protocol during bolus administration of intravenous contrast. CONTRAST:  13mL OMNIPAQUE IOHEXOL 300 MG/ML  SOLN COMPARISON:  03/14/2020 FINDINGS: CT CHEST FINDINGS Cardiovascular: Prominent by biatrial size. Low-density filling defect is seen at the left atrial appendage measuring 2.6 cm. Newly seen right lower lobe emboli involving the lobar to segmental vessels. No pericardial effusion. Mediastinum/Nodes: No hematoma or pneumomediastinum. Lungs/Pleura: Atelectasis at the lung bases. Trace left pleural effusion which is low-density appearing Musculoskeletal: Acute appearing lateral left fifth, sixth, seventh rib fractures. Remote anterior left fourth, fifth, and sixth rib fractures with callus. Remote anterior right rib fractures. CT ABDOMEN PELVIS FINDINGS  Hepatobiliary: No hepatic injury or perihepatic  hematoma. Gallbladder is absent Pancreas: Prominent fatty atrophy. Single calcification at the head which appears separate from the course of the distal CBD Spleen: No splenic injury or perisplenic hematoma. Adrenals/Urinary Tract: No adrenal hemorrhage or renal injury identified. Bladder is collapsed around a Foley catheter Stomach/Bowel: No evidence of injury. Distal colonic diverticulosis. Vascular/Lymphatic: Atheromatous calcifications. No acute vascular finding. Reproductive: Hysterectomy Other: Mixed density presacral mass which measures 4 cm in diameter. No visible involvement of the sacrum or adjacent nerve roots. There are areas of fatty density within the mass, suggesting myelolipoma - which occur in this area. Musculoskeletal: Scoliosis and diffuse degenerative disease. Prior T11 compression fracture and cement augmentation. Critical Value/emergent results were called by telephone at the time of interpretation on 04/03/2020 at 8:43 am to Dr Gustavus Messing , who verbally acknowledged these results. IMPRESSION: 1. Large thrombus in the left atrial appendage. 2. Lobar and segmental right lower lobe pulmonary emboli new from CTA 3 weeks ago. 3. Acute left fifth, sixth, and seventh rib fractures. Remote rib fractures on both sides. There is atelectasis and trace left pleural fluid. No pneumothorax. 4. 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 Recommend imaging follow-up to confirm stability. Electronically Signed   By: Monte Fantasia M.D.   On: 04/03/2020 08:47   CT ABDOMEN PELVIS W CONTRAST  Result Date: 04/03/2020 CLINICAL DATA:  Abdominal trauma.  Fall with back pain EXAM: CT CHEST, ABDOMEN, AND PELVIS WITH CONTRAST TECHNIQUE: Multidetector CT imaging of the chest, abdomen and pelvis was performed following the standard protocol during bolus administration of intravenous contrast. CONTRAST:  128mL OMNIPAQUE IOHEXOL 300  MG/ML  SOLN COMPARISON:  03/14/2020 FINDINGS: CT CHEST FINDINGS Cardiovascular: Prominent by biatrial size. Low-density filling defect is seen at the left atrial appendage measuring 2.6 cm. Newly seen right lower lobe emboli involving the lobar to segmental vessels. No pericardial effusion. Mediastinum/Nodes: No hematoma or pneumomediastinum. Lungs/Pleura: Atelectasis at the lung bases. Trace left pleural effusion which is low-density appearing Musculoskeletal: Acute appearing lateral left fifth, sixth, seventh rib fractures. Remote anterior left fourth, fifth, and sixth rib fractures with callus. Remote anterior right rib fractures. CT ABDOMEN PELVIS FINDINGS Hepatobiliary: No hepatic injury or perihepatic hematoma. Gallbladder is absent Pancreas: Prominent fatty atrophy. Single calcification at the head which appears separate from the course of the distal CBD Spleen: No splenic injury or perisplenic hematoma. Adrenals/Urinary Tract: No adrenal hemorrhage or renal injury identified. Bladder is collapsed around a Foley catheter Stomach/Bowel: No evidence of injury. Distal colonic diverticulosis. Vascular/Lymphatic: Atheromatous calcifications. No acute vascular finding. Reproductive: Hysterectomy Other: Mixed density presacral mass which measures 4 cm in diameter. No visible involvement of the sacrum or adjacent nerve roots. There are areas of fatty density within the mass, suggesting myelolipoma - which occur in this area. Musculoskeletal: Scoliosis and diffuse degenerative disease. Prior T11 compression fracture and cement augmentation. Critical Value/emergent results were called by telephone at the time of interpretation on 04/03/2020 at 8:43 am to Dr Gustavus Messing , who verbally acknowledged these results. IMPRESSION: 1. Large thrombus in the left atrial appendage. 2. Lobar and segmental right lower lobe pulmonary emboli new from CTA 3 weeks ago. 3. Acute left fifth, sixth, and seventh rib fractures. Remote rib  fractures on both sides. There is atelectasis and trace left pleural fluid. No pneumothorax. 4. 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 Recommend imaging follow-up to confirm stability. Electronically Signed   By: Neva Seat.D.  On: 04/03/2020 08:47   DG CHEST PORT 1 VIEW  Result Date: 04/04/2020 CLINICAL DATA:  Rib fractures. EXAM: PORTABLE CHEST 1 VIEW COMPARISON:  CT 04/03/2020.  Chest x-ray 03/21/2020. FINDINGS: Mediastinum hilar structures normal. Heart size stable. Persistent left base atelectasis/infiltrate small left pleural effusion. No pneumothorax. Multiple rib fractures best identified by prior CT. IMPRESSION: 1. Persistent left base atelectasis/infiltrate and small left pleural effusion. No interim change. No pneumothorax. 2. Multiple rib fractures best identified by prior CT. Electronically Signed   By: Marcello Moores  Register   On: 04/04/2020 05:55   ECHOCARDIOGRAM COMPLETE  Result Date: 04/04/2020    ECHOCARDIOGRAM REPORT   Patient Name:   Mary Rice Date of Exam: 04/04/2020 Medical Rec #:  [id number]     Height:       68.0 in Accession #:    [id number]    Weight:       198.0 lb Date of Birth:  10/10/1932      BSA:          2.035 m Patient Age:    32 years      BP:           134/58 mmHg Patient Gender: F             HR:           69 bpm. Exam Location:  Inpatient Procedure: 2D Echo, Color Doppler, Cardiac Doppler and Intracardiac            Opacification Agent Indications:    Atrial Fibrillation 427.31 / I48.91  History:        Patient has prior history of Echocardiogram examinations, most                 recent 10/27/2015. COPD, Arrythmias:Atrial Fibrillation; Risk                 Factors:Hypertension, Diabetes and Dyslipidemia. Pulmonary                 embolism.  Sonographer:    Vickie Epley RDCS Referring Phys: [id number] [place]  1. Left ventricular ejection fraction, by estimation, is 65 to 70%. The left ventricle has normal  function. The left ventricle has no regional wall motion abnormalities. Left ventricular diastolic function could not be evaluated.  2. Right ventricular systolic function is normal. The right ventricular size is normal. There is mildly elevated pulmonary artery systolic pressure. The estimated right ventricular systolic pressure is 83.3 mmHg.  3. The mitral valve is normal in structure. No evidence of mitral valve regurgitation. No evidence of mitral stenosis.  4. Tricuspid valve regurgitation is moderate.  5. The aortic valve is normal in structure. There is moderate calcification of the aortic valve. There is moderate thickening of the aortic valve. Aortic valve regurgitation is trivial. Mild to moderate aortic valve sclerosis/calcification is present, without any evidence of aortic stenosis.  6. The inferior vena cava is normal in size with greater than 50% respiratory variability, suggesting right atrial pressure of 3 mmHg. FINDINGS  Left Ventricle: Left ventricular ejection fraction, by estimation, is 65 to 70%. The left ventricle has normal function. The left ventricle has no regional wall motion abnormalities. Definity contrast agent was given IV to delineate the left ventricular  endocardial borders. The left ventricular internal cavity size was normal in size. There is no left ventricular hypertrophy. Left ventricular diastolic function could not be evaluated due to atrial fibrillation. Left ventricular diastolic function could  not be  evaluated. Right Ventricle: The right ventricular size is normal. No increase in right ventricular wall thickness. Right ventricular systolic function is normal. There is mildly elevated pulmonary artery systolic pressure. The tricuspid regurgitant velocity is 3.02  m/s, and with an assumed right atrial pressure of 3 mmHg, the estimated right ventricular systolic pressure is 45.8 mmHg. Left Atrium: Left atrial size was normal in size. Right Atrium: Right atrial size was  normal in size. Pericardium: There is no evidence of pericardial effusion. Mitral Valve: The mitral valve is normal in structure. There is mild thickening of the mitral valve leaflet(s). Mild mitral annular calcification. No evidence of mitral valve regurgitation. No evidence of mitral valve stenosis. Tricuspid Valve: The tricuspid valve is normal in structure. Tricuspid valve regurgitation is moderate . No evidence of tricuspid stenosis. Aortic Valve: The aortic valve is normal in structure. There is moderate calcification of the aortic valve. There is moderate thickening of the aortic valve. Aortic valve regurgitation is trivial. Mild to moderate aortic valve sclerosis/calcification is present, without any evidence of aortic stenosis. Pulmonic Valve: The pulmonic valve was normal in structure. Pulmonic valve regurgitation is not visualized. No evidence of pulmonic stenosis. Aorta: The aortic root is normal in size and structure. Venous: The inferior vena cava is normal in size with greater than 50% respiratory variability, suggesting right atrial pressure of 3 mmHg. IAS/Shunts: No atrial level shunt detected by color flow Doppler.  LEFT VENTRICLE PLAX 2D LVIDd:         4.70 cm LVIDs:         3.90 cm LV PW:         0.70 cm LV IVS:        0.70 cm LVOT diam:     2.10 cm LV SV:         72 LV SV Index:   35 LVOT Area:     3.46 cm  LV Volumes (MOD) LV vol d, MOD A4C: 69.9 ml LV vol s, MOD A4C: 27.5 ml LV SV MOD A4C:     69.9 ml RIGHT VENTRICLE RV S prime:     7.62 cm/s TAPSE (M-mode): 1.5 cm LEFT ATRIUM           Index       RIGHT ATRIUM           Index LA diam:      3.70 cm 1.82 cm/m  RA Area:     15.70 cm LA Vol (A2C): 41.7 ml 20.49 ml/m RA Volume:   38.60 ml  18.97 ml/m LA Vol (A4C): 36.8 ml 18.08 ml/m  AORTIC VALVE LVOT Vmax:   101.00 cm/s LVOT Vmean:  70.700 cm/s LVOT VTI:    0.208 m  AORTA Ao Root diam: 2.90 cm TRICUSPID VALVE TR Peak grad:   36.5 mmHg TR Vmax:        302.00 cm/s  SHUNTS Systemic VTI:  0.21  m Systemic Diam: 2.10 cm Ena Dawley MD Electronically signed by Ena Dawley MD Signature Date/Time: 04/04/2020/1:09:18 PM    Final    {  Assessment and Plan:   Left atrial thrombus Pulmonary embolus Hx of DVT and bilateral PE (2007) - heparin started --> will transition to eliquis if no additional procedures needed - stopped eliquis following fall in 01/2020 This patients CHA2DS2-VASc Score and unadjusted Ischemic Stroke Rate (% per year) is equal to 9.7 % stroke rate/year from a score of 6 (2age, female, 2PE/DVT, HTN) - HAD-BLED score 2 for age and full  dose ASA - will stop ASA and start treatment dose of eliquis, per pharmacy - had a long discussion regarding fall prevention - she lives at home this her husband   Permanent Afib - rate controlled with atenolol   Hyperlipidemia - continue pravastatin   Hypertension - atenolol     For questions or updates, please contact [place] Please consult [email or web address] for contact info under    Signed, Ledora Bottcher, PA  04/04/2020 1:10 PM   Patient seen and examined.  Agree with above documentation.  Ms. Rollins is an 86 year old female with a history of permanent atrial fibrillation, PE/DVT, hypertension, hyperlipidemia who we are consulted to see by Dr. Tyrell Antonio for evaluation of left atrial appendage thrombus.  She was diagnosed with atrial fibrillation in 2017 and started on anticoagulation at that time.  She also had a history of DVT and bilateral PE after knee surgery in 2007.  She has had several falls recently, and her anticoagulation had been stopped.  She fell 03/2019 on her back.  In 01/2020, she had another fall where she landed on her back and head.  She underwent kyphoplasty for compression fracture in 02/2020.  She was admitted twice earlier this month.  First was on 03/14/2020 with confusion and found to have pneumonia.  She was discharged on 03/15/2020.  She presented again on 03/21/2020 with altered mental status,  and was discharged on 9/15.  For her current admission, she presented on 9/26 after having a fall at home.  She was found to have multiple rib fractures.  CT chest showed right lower lobe PE and left atrial appendage thrombus.  Echocardiogram today shows LVEF 65 to 70%, normal RV function, moderate TR.  Telemetry personally reviewed and shows atrial fibrillation with rate 50s to 70s, pauses up to 2.6 seconds.  On exam, patient is alert and oriented, normal rate, irregular rhythm, no murmurs, lungs CTAB, 1+ LE edema.  For left atrial appendage thrombus, CT personally reviewed.  While no delayed imaging of the chest was done, which typically is needed to diagnose LAA thrombus, the appendage fills to the tip and there is a clear filling defect at the base of the appendage.  In addition, she has a PE, so will need anticoagulation anyways.  Previously it was felt that her risk of anticoagulation outweighed the benefits given falls.  However given her PE and LAA thrombus, she is high risk for stroke and recommend anticoagulation.  Discussed risks and benefits with patient and she was agreeable to starting anticoagulation.  Discussed with patient and emphasized being very cautious when walking due to her fall risk.  We will start Eliquis 5 mg twice daily.  If further issues with falls, would recommend referral for LAA closure device.  Donato Heinz, MD

## 2020-04-04 NOTE — Evaluation (Signed)
Physical Therapy Evaluation Patient Details Name: Mary Rice MRN: [id number] DOB: Oct 03, 1932 Today's Date: 04/04/2020   History of Present Illness  Mary Rice is a 86 y.o. female with medical history significant of a fib, DVT/PE, HTN, dyslipidemia, and recent fall with vertebral compression fx and kypholasty 02/26/20.  Presents with left flank/chest pain after fall and found to have rib fx L 5-7, emboli RLL & L atrial appendage thrombus.  Clinical Impression  Patient presents with decreased mobility due to pain L ribs with mobility, decreased balance, decreased safety awareness, decreased strength and two recent falls.  Reports she has capable 24 hour assist and has done PT at home for couple of months already.  Currently pt needing mod A overall to mobilize due to pain and limited activity tolerance.  She was able to mobilize at home with RW unaided per her report.  PT to continue to follow acutely and recommend resume HHPT at d/c.     Follow Up Recommendations Home health PT;Supervision/Assistance - 24 hour    Equipment Recommendations  None recommended by PT    Recommendations for Other Services       Precautions / Restrictions Precautions Precautions: Fall Precaution Comments: reports L knee buckles at time      Mobility  Bed Mobility Overal bed mobility: Needs Assistance Bed Mobility: Rolling;Sidelying to Sit Rolling: Mod assist Sidelying to sit: Mod assist;HOB elevated       General bed mobility comments: lifting help due to pain in ribs  Transfers Overall transfer level: Needs assistance Equipment used: Rolling walker (2 wheeled) Transfers: Sit to/from Stand Sit to Stand: Mod assist         General transfer comment: lifting help and daughter in law helping too, but cued not to pull up with L arm as pt with rib pain  Ambulation/Gait Ambulation/Gait assistance: Min assist Gait Distance (Feet): 12 Feet Assistive device: Rolling walker (2 wheeled) Gait  Pattern/deviations: Step-to pattern;Step-through pattern;Decreased stride length;Wide base of support     General Gait Details: walked around bed with assist for balance, safety and cues for walker proximity  Stairs            Wheelchair Mobility    Modified Rankin (Stroke Patients Only)       Balance Overall balance assessment: Needs assistance Sitting-balance support: Feet supported Sitting balance-Leahy Scale: Fair Sitting balance - Comments: at times leaning back at EOB when donning gait belt with cues for forward weight shift   Standing balance support: Bilateral upper extremity supported Standing balance-Leahy Scale: Poor Standing balance comment: reliant on UE support and assist                             Pertinent Vitals/Pain Pain Assessment: Faces Faces Pain Scale: Hurts whole lot Pain Location: L side with rolling and side to sit Pain Descriptors / Indicators: Aching;Grimacing;Guarding Pain Intervention(s): Monitored during session;Repositioned    Home Living Family/patient expects to be discharged to:: Private residence Living Arrangements: Spouse/significant other Available Help at Discharge: Family;Available 24 hours/day Type of Home: House Home Access: Ramped entrance     Home Layout: Laundry or work area in basement;Able to live on main level with bedroom/bathroom Home Equipment: Environmental consultant - 2 wheels;Walker - 4 wheels;Bedside commode      Prior Function Level of Independence: Independent with assistive device(s)         Comments: using RW at home     Hand Dominance  Dominant Hand: Right    Extremity/Trunk Assessment   Upper Extremity Assessment Upper Extremity Assessment: LUE deficits/detail LUE Deficits / Details: limited elevation due to rib pain; lifts shoulder antigravity, but NT due to pain, otherwise generalized weakness    Lower Extremity Assessment Lower Extremity Assessment: Generalized weakness    Cervical /  Trunk Assessment Cervical / Trunk Assessment: Kyphotic  Communication   Communication: No difficulties  Cognition Arousal/Alertness: Awake/alert Behavior During Therapy: WFL for tasks assessed/performed Overall Cognitive Status: Impaired/Different from baseline Area of Impairment: Problem solving;Following commands                       Following Commands: Follows one step commands consistently;Follows one step commands with increased time     Problem Solving: Slow processing;Requires verbal cues;Requires tactile cues General Comments: orientation not formally assessed, but daughter in law present and speaking for patient, recent admission with some cognitive deficits noted      General Comments      Exercises     Assessment/Plan    PT Assessment Patient needs continued PT services  PT Problem List Decreased strength;Decreased mobility;Decreased activity tolerance;Decreased balance;Decreased knowledge of use of DME;Decreased safety awareness       PT Treatment Interventions DME instruction;Gait training;Therapeutic activities;Patient/family education;Balance training;Functional mobility training    PT Goals (Current goals can be found in the Care Plan section)  Acute Rehab PT Goals Patient Stated Goal: to go home PT Goal Formulation: With patient/family Time For Goal Achievement: 04/18/20 Potential to Achieve Goals: Good    Frequency Min 3X/week   Barriers to discharge        Co-evaluation               AM-PAC PT "6 Clicks" Mobility  Outcome Measure Help needed turning from your back to your side while in a flat bed without using bedrails?: A Lot Help needed moving from lying on your back to sitting on the side of a flat bed without using bedrails?: A Lot Help needed moving to and from a bed to a chair (including a wheelchair)?: Total Help needed standing up from a chair using your arms (e.g., wheelchair or bedside chair)?: A Lot Help needed to walk  in hospital room?: Total Help needed climbing 3-5 steps with a railing? : A Lot 6 Click Score: 10    End of Session Equipment Utilized During Treatment: Gait belt Activity Tolerance: Patient limited by pain Patient left: in chair;with call bell/phone within reach;with family/visitor present;with chair alarm set   PT Visit Diagnosis: Other abnormalities of gait and mobility (R26.89);Repeated falls (R29.6);Pain;Muscle weakness (generalized) (M62.81) Pain - Right/Left: Left Pain - part of body:  (L side)    Time: 2536-6440 PT Time Calculation (min) (ACUTE ONLY): 38 min   Charges:   PT Evaluation $PT Eval Moderate Complexity: 1 Mod PT Treatments $Gait Training: 8-22 mins        Magda Kiel, PT Acute Rehabilitation Services [id number]:[phone] Office:[phone] 04/04/2020   Mary Rice 04/04/2020, 1:18 PM

## 2020-04-04 NOTE — Progress Notes (Addendum)
PROGRESS NOTE    Mary Rice  NID:[id number] DOB: [birth year] DOA: 04/03/2020 PCP: Rusty Aus, MD   Brief Narrative: 86 year old with past medical history significant for A. fib who presented with left flank chest pain after a fall.  She reports 2 days ago she was leaning over the edge of her bed to get to the [facility] and she fell forward into the radiator and hit her left side.  She denies LOC or head injury.  She remembers the entire fall.  Patient presented to the ED due to progressive and worsening pain.  Evaluation in the ED patient was found to have acute fractures of the left fifth, sixth and seventh ribs.  Imaging also showed new emboli in the right lower lobe of of the lung and a large left anterior appendage thrombus.  Trauma team has been consulted.     Assessment & Plan:   Active Problems:   Rib fractures  1-Multiple falls, acute left rib fractures (fifth sixth seventh) -Continue with incentive spirometry -Pulmonary toileting -Appreciate trauma evaluation  2-Acute right lower lobe PE: -She was a started on heparin, plan to transition to Lovenox.  If vitals continue to be stable plan to transition to Eliquis tomorrow  3-Left atrial appendage thrombus: Cardiology consulted. Plan to transition to Eliquis, if no further procedures are planned  4-A. fib: Continue with atenolol Plan to transition to Eliquis for treatment of PE and A. Fib  Presacral mass;  CT: Nearly 4 cm presacral mass which has areas of fatty density, favoring myelolipoma. Recommend imaging follow-up to confirm Stability.  Hyponatremia; Monitor  Hypokalemia; Replete orally.  Hyperlipidemia: Continue with pravastatin  Anxiety: Continue with Wellbutrin   Estimated body mass index is 30.11 kg/m as calculated from the following:   Height as of this encounter: 5\' 8"  (1.727 m).   Weight as of this encounter: 89.8 kg.   DVT prophylaxis: Heparin transition to Lovenox Code Status: Partial  code Family Communication: daughter in law at bedside/ Disposition Plan:  Status is: Inpatient  Remains inpatient appropriate because:IV treatments appropriate due to intensity of illness or inability to take PO   Dispo: The patient is from: Home              Anticipated d/c is to: To be determined              Anticipated d/c date is: 2 days              Patient currently is not medically stable to d/c.        Consultants:   Cardiology  Procedures:   Echo  Antimicrobials:    Subjective: She denies chest pain  Objective: Vitals:   04/03/20 2033 04/03/20 2123 04/03/20 2314 04/04/20 0443  BP: (!) 163/103 (!) 166/86 (!) 103/56 (!) 123/48  Pulse: 92 98  (!) 57  Resp: 18 19  17   Temp: 98.5 F (36.9 C)  98.2 F (36.8 C) 97.8 F (36.6 C)  TempSrc: Oral  Oral Oral  SpO2: 96% 92%  96%  Weight:      Height:        Intake/Output Summary (Last 24 hours) at 04/04/2020 0803 Last data filed at 04/04/2020 0430 Gross per 24 hour  Intake 202.33 ml  Output 150 ml  Net 52.33 ml   Filed Weights   04/03/20 0141  Weight: 89.8 kg    Examination:  General exam: Appears calm and comfortable  Respiratory system: Clear to auscultation. Respiratory effort normal. Cardiovascular  system: S1 & S2 heard, RRR. No JVD, murmurs, rubs, gallops or clicks. No pedal edema. Gastrointestinal system: Abdomen is nondistended, soft and nontender. No organomegaly or masses felt. Normal bowel sounds heard. Central nervous system: Alert and oriented. No focal neurological deficits. Extremities: Symmetric 5 x 5 power.   Data Reviewed: I have personally reviewed following labs and imaging studies  CBC: Recent Labs  Lab 04/03/20 0526  WBC 9.1  NEUTROABS 7.5  HGB 13.5  HCT 40.8  MCV 90.9  PLT 811   Basic Metabolic Panel: Recent Labs  Lab 04/03/20 0526  NA 133*  K 3.4*  CL 98  CO2 27  GLUCOSE 125*  BUN 13  CREATININE 0.72  CALCIUM 9.0  MG 2.0   GFR: Estimated Creatinine  Clearance: 60.3 mL/min (by C-G formula based on SCr of 0.72 mg/dL). Liver Function Tests: No results for input(s): AST, ALT, ALKPHOS, BILITOT, PROT, ALBUMIN in the last 168 hours. No results for input(s): LIPASE, AMYLASE in the last 168 hours. No results for input(s): AMMONIA in the last 168 hours. Coagulation Profile: No results for input(s): INR, PROTIME in the last 168 hours. Cardiac Enzymes: No results for input(s): CKTOTAL, CKMB, CKMBINDEX, TROPONINI in the last 168 hours. BNP (last 3 results) No results for input(s): PROBNP in the last 8760 hours. HbA1C: No results for input(s): HGBA1C in the last 72 hours. CBG: No results for input(s): GLUCAP in the last 168 hours. Lipid Profile: No results for input(s): CHOL, HDL, LDLCALC, TRIG, CHOLHDL, LDLDIRECT in the last 72 hours. Thyroid Function Tests: No results for input(s): TSH, T4TOTAL, FREET4, T3FREE, THYROIDAB in the last 72 hours. Anemia Panel: No results for input(s): VITAMINB12, FOLATE, FERRITIN, TIBC, IRON, RETICCTPCT in the last 72 hours. Sepsis Labs: No results for input(s): PROCALCITON, LATICACIDVEN in the last 168 hours.  Recent Results (from the past 240 hour(s))  Respiratory Panel by RT PCR (Flu A&B, Covid) - Nasopharyngeal Swab     Status: None   Collection Time: 04/03/20  6:55 AM   Specimen: Nasopharyngeal Swab  Result Value Ref Range Status   SARS Coronavirus 2 by RT PCR NEGATIVE NEGATIVE Final    Comment: (NOTE) SARS-CoV-2 target nucleic acids are NOT DETECTED.  The SARS-CoV-2 RNA is generally detectable in upper respiratoy specimens during the acute phase of infection. The lowest concentration of SARS-CoV-2 viral copies this assay can detect is 131 copies/mL. A negative result does not preclude SARS-Cov-2 infection and should not be used as the sole basis for treatment or other patient management decisions. A negative result may occur with  improper specimen collection/handling, submission of specimen  other than nasopharyngeal swab, presence of viral mutation(s) within the areas targeted by this assay, and inadequate number of viral copies (<131 copies/mL). A negative result must be combined with clinical observations, patient history, and epidemiological information. The expected result is Negative.  Fact Sheet for Patients:  PinkCheek.be  Fact Sheet for Healthcare Providers:  GravelBags.it  This test is no t yet approved or cleared by the [place] FDA and  has been authorized for detection and/or diagnosis of SARS-CoV-2 by FDA under an Emergency Use Authorization (EUA). This EUA will remain  in effect (meaning this test can be used) for the duration of the COVID-19 declaration under Section 564(b)(1) of the Act, 21 U.S.C. section 360bbb-3(b)(1), unless the authorization is terminated or revoked sooner.     Influenza A by PCR NEGATIVE NEGATIVE Final   Influenza B by PCR NEGATIVE NEGATIVE Final  Comment: (NOTE) The Xpert Xpress SARS-CoV-2/FLU/RSV assay is intended as an aid in  the diagnosis of influenza from Nasopharyngeal swab specimens and  should not be used as a sole basis for treatment. Nasal washings and  aspirates are unacceptable for Xpert Xpress SARS-CoV-2/FLU/RSV  testing.  Fact Sheet for Patients: PinkCheek.be  Fact Sheet for Healthcare Providers: GravelBags.it  This test is not yet approved or cleared by the [place] FDA and  has been authorized for detection and/or diagnosis of SARS-CoV-2 by  FDA under an Emergency Use Authorization (EUA). This EUA will remain  in effect (meaning this test can be used) for the duration of the  Covid-19 declaration under Section 564(b)(1) of the Act, 21  U.S.C. section 360bbb-3(b)(1), unless the authorization is  terminated or revoked. Performed at [facility], [place] [address] [place], [place] [address]   MRSA PCR Screening     Status: None   Collection Time: 04/03/20  9:30 PM   Specimen: Nasal Mucosa; Nasopharyngeal  Result Value Ref Range Status   MRSA by PCR NEGATIVE NEGATIVE Final    Comment:        The GeneXpert MRSA Assay (FDA approved for NASAL specimens only), is one component of a comprehensive MRSA colonization surveillance program. It is not intended to diagnose MRSA infection nor to guide or monitor treatment for MRSA infections. Performed at [facility], [place] [address] [place],  [address]          Radiology Studies: DG Ribs Unilateral W/Chest Left  Result Date: 04/03/2020 CLINICAL DATA:  Initial evaluation for acute left-sided thoracic and back pain status post recent fall. EXAM: LEFT RIBS AND CHEST - 3+ VIEW COMPARISON:  Prior radiograph from 03/21/2020. FINDINGS: Mild cardiomegaly, stable. Mediastinal silhouette within normal limits. Aortic atherosclerosis. Lungs are hypoinflated. Small left pleural effusion. Associated patchy bibasilar opacities favored to reflect atelectasis. No visible pneumothorax. Dedicated views of the left ribs were performed. There are acute mildly displaced fractures of the left fifth, 6, seventh, and likely eighth ribs. Thoracolumbar scoliosis with sequelae of prior thoracic augmentation noted. IMPRESSION: 1. Acute mildly displaced fractures of the left fifth, sixth, seventh, and likely eighth ribs. 2. Small left pleural effusion with associated bibasilar atelectasis. 3.  Aortic Atherosclerosis (ICD10-I70.0). Electronically Signed   By: Jeannine Boga M.D.   On: 04/03/2020 04:39   CT Head Wo Contrast  Result Date: 04/03/2020 CLINICAL DATA:  Head trauma. EXAM: CT HEAD WITHOUT CONTRAST TECHNIQUE: Contiguous axial images were obtained from the base of the skull through the vertex without intravenous contrast. COMPARISON:  03/21/2020 FINDINGS: Brain: No evidence of acute large vascular territory  infarction, hemorrhage, hydrocephalus, extra-axial collection or mass lesion/mass effect. Similar patchy white matter hypoattenuation, compatible with the sequela of chronic microvascular ischemic disease. Similar mild diffuse cerebral atrophy with ex vacuo ventricular dilation. Vascular: Calcific atherosclerosis. Skull: Normal. Negative for fracture or focal lesion. Sinuses/Orbits: No acute finding. Other: No mastoid effusions. IMPRESSION: No evidence of acute intracranial abnormality. Electronically Signed   By: Margaretha Sheffield MD   On: 04/03/2020 08:33   CT Chest W Contrast  Result Date: 04/03/2020 CLINICAL DATA:  Abdominal trauma.  Fall with back pain EXAM: CT CHEST, ABDOMEN, AND PELVIS WITH CONTRAST TECHNIQUE: Multidetector CT imaging of the chest, abdomen and pelvis was performed following the standard protocol during bolus administration of intravenous contrast. CONTRAST:  126mL OMNIPAQUE IOHEXOL 300 MG/ML  SOLN COMPARISON:  03/14/2020 FINDINGS: CT CHEST FINDINGS Cardiovascular: Prominent by biatrial size. Low-density  filling defect is seen at the left atrial appendage measuring 2.6 cm. Newly seen right lower lobe emboli involving the lobar to segmental vessels. No pericardial effusion. Mediastinum/Nodes: No hematoma or pneumomediastinum. Lungs/Pleura: Atelectasis at the lung bases. Trace left pleural effusion which is low-density appearing Musculoskeletal: Acute appearing lateral left fifth, sixth, seventh rib fractures. Remote anterior left fourth, fifth, and sixth rib fractures with callus. Remote anterior right rib fractures. CT ABDOMEN PELVIS FINDINGS Hepatobiliary: No hepatic injury or perihepatic hematoma. Gallbladder is absent Pancreas: Prominent fatty atrophy. Single calcification at the head which appears separate from the course of the distal CBD Spleen: No splenic injury or perisplenic hematoma. Adrenals/Urinary Tract: No adrenal hemorrhage or renal injury identified. Bladder is collapsed  around a Foley catheter Stomach/Bowel: No evidence of injury. Distal colonic diverticulosis. Vascular/Lymphatic: Atheromatous calcifications. No acute vascular finding. Reproductive: Hysterectomy Other: Mixed density presacral mass which measures 4 cm in diameter. No visible involvement of the sacrum or adjacent nerve roots. There are areas of fatty density within the mass, suggesting myelolipoma - which occur in this area. Musculoskeletal: Scoliosis and diffuse degenerative disease. Prior T11 compression fracture and cement augmentation. Critical Value/emergent results were called by telephone at the time of interpretation on 04/03/2020 at 8:43 am to Dr Gustavus Messing , who verbally acknowledged these results. IMPRESSION: 1. Large thrombus in the left atrial appendage. 2. Lobar and segmental right lower lobe pulmonary emboli new from CTA 3 weeks ago. 3. Acute left fifth, sixth, and seventh rib fractures. Remote rib fractures on both sides. There is atelectasis and trace left pleural fluid. No pneumothorax. 4. 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 Recommend imaging follow-up to confirm stability. Electronically Signed   By: Monte Fantasia M.D.   On: 04/03/2020 08:47   CT ABDOMEN PELVIS W CONTRAST  Result Date: 04/03/2020 CLINICAL DATA:  Abdominal trauma.  Fall with back pain EXAM: CT CHEST, ABDOMEN, AND PELVIS WITH CONTRAST TECHNIQUE: Multidetector CT imaging of the chest, abdomen and pelvis was performed following the standard protocol during bolus administration of intravenous contrast. CONTRAST:  151mL OMNIPAQUE IOHEXOL 300 MG/ML  SOLN COMPARISON:  03/14/2020 FINDINGS: CT CHEST FINDINGS Cardiovascular: Prominent by biatrial size. Low-density filling defect is seen at the left atrial appendage measuring 2.6 cm. Newly seen right lower lobe emboli involving the lobar to segmental vessels. No pericardial effusion. Mediastinum/Nodes: No hematoma or pneumomediastinum.  Lungs/Pleura: Atelectasis at the lung bases. Trace left pleural effusion which is low-density appearing Musculoskeletal: Acute appearing lateral left fifth, sixth, seventh rib fractures. Remote anterior left fourth, fifth, and sixth rib fractures with callus. Remote anterior right rib fractures. CT ABDOMEN PELVIS FINDINGS Hepatobiliary: No hepatic injury or perihepatic hematoma. Gallbladder is absent Pancreas: Prominent fatty atrophy. Single calcification at the head which appears separate from the course of the distal CBD Spleen: No splenic injury or perisplenic hematoma. Adrenals/Urinary Tract: No adrenal hemorrhage or renal injury identified. Bladder is collapsed around a Foley catheter Stomach/Bowel: No evidence of injury. Distal colonic diverticulosis. Vascular/Lymphatic: Atheromatous calcifications. No acute vascular finding. Reproductive: Hysterectomy Other: Mixed density presacral mass which measures 4 cm in diameter. No visible involvement of the sacrum or adjacent nerve roots. There are areas of fatty density within the mass, suggesting myelolipoma - which occur in this area. Musculoskeletal: Scoliosis and diffuse degenerative disease. Prior T11 compression fracture and cement augmentation. Critical Value/emergent results were called by telephone at the time of interpretation on 04/03/2020 at 8:43 am to Dr Gustavus Messing , who verbally acknowledged  these results. IMPRESSION: 1. Large thrombus in the left atrial appendage. 2. Lobar and segmental right lower lobe pulmonary emboli new from CTA 3 weeks ago. 3. Acute left fifth, sixth, and seventh rib fractures. Remote rib fractures on both sides. There is atelectasis and trace left pleural fluid. No pneumothorax. 4. 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 Recommend imaging follow-up to confirm stability. Electronically Signed   By: Monte Fantasia M.D.   On: 04/03/2020 08:47   DG CHEST PORT 1 VIEW  Result Date:  04/04/2020 CLINICAL DATA:  Rib fractures. EXAM: PORTABLE CHEST 1 VIEW COMPARISON:  CT 04/03/2020.  Chest x-ray 03/21/2020. FINDINGS: Mediastinum hilar structures normal. Heart size stable. Persistent left base atelectasis/infiltrate small left pleural effusion. No pneumothorax. Multiple rib fractures best identified by prior CT. IMPRESSION: 1. Persistent left base atelectasis/infiltrate and small left pleural effusion. No interim change. No pneumothorax. 2. Multiple rib fractures best identified by prior CT. Electronically Signed   By: Marcello Moores  Register   On: 04/04/2020 05:55        Scheduled Meds: . acetaminophen  650 mg Oral Q6H  . atenolol  25 mg Oral Daily  . buPROPion  150 mg Oral Daily  . Chlorhexidine Gluconate Cloth  6 each Topical Daily  . docusate sodium  100 mg Oral QHS  . gabapentin  100 mg Oral TID  . ibuprofen  800 mg Oral TID  . pantoprazole  40 mg Oral Daily  . pravastatin  20 mg Oral QHS  . sodium chloride flush  3 mL Intravenous Q12H  . sodium chloride  1 g Oral BID WC   Continuous Infusions: . heparin 1,100 Units/hr (04/04/20 0555)     LOS: 1 day    Time spent: 35 minutes.     Elmarie Shiley, MD [facility]   If 7PM-7AM, please contact night-coverage [email or web address]  04/04/2020, 8:03 AM

## 2020-04-04 NOTE — Progress Notes (Signed)
[place] for heparin>>lovenox Indication: acute pulmonary embolus and left atrial thrombus  Allergies  Allergen Reactions  . Biaxin [Clarithromycin] Other (See Comments)    Reaction unknown  . Maxzide [Hydrochlorothiazide W-Triamterene] Other (See Comments)    Reaction unknown  . Penicillins Rash  . Sulfa Antibiotics Rash    Patient Measurements: Height: 5\' 8"  (172.7 cm) Weight: 89.8 kg (198 lb) IBW/kg (Calculated) : 63.9 Heparin Dosing Weight: 83 kg  Vital Signs: Temp: 98 F (36.7 C) (09/27 1601) Temp Source: Oral (09/27 1601) BP: 100/82 (09/27 1601) Pulse Rate: 67 (09/27 1601)  Labs: Recent Labs    04/03/20 0526 04/03/20 0956 04/03/20 2000  HGB 13.5  --   --   HCT 40.8  --   --   PLT 150  --   --   APTT  --  32  --   HEPARINUNFRC  --  <0.10* 0.74*  CREATININE 0.72  --   --     Estimated Creatinine Clearance: 60.3 mL/min (by C-G formula based on SCr of 0.72 mg/dL).  Assessment: Patient is an 86 y.o F with hx DVT/PE and afib with discharged summary on 03/15/20 indicating she was taken off Eliquis with this admission due to fall risk.  She presented to the ED on 9/26 s/p fall on 9/24 with c/o chest pain.  Pharmacy is consulted to start heparin drip for acute PE and atrial thrombus.  - 9/26 chest CTA: Large thrombus in the left atrial appendage. Lobar and segmental right lower lobe pulmonary emboli new from CTA 3 weeks ago. Acute left fifth, sixth, and seventh rib fractures - 9/26 head CT: no acute findings  Unable to obtain heparin labs today, discussed with attending physician and will transition to lovenox tonight and if stable overnight will consider transitioning patient to [place] in am.    Goal of Therapy:  Heparin level 0.6-1.2 Monitor platelets by anticoagulation protocol: Yes   Plan:  Stop heparin after lovenox is given Lovenox 1mg /kg q12 hours until stable enough to transition to oral DOAC.   Erin Hearing PharmD.,  BCPS Clinical Pharmacist 04/04/2020 4:30 PM

## 2020-04-04 NOTE — Consult Note (Signed)
[facility] Surgery Consult Note  Mary Rice 05-03-1933  [id number].    Requesting MD: Carney Harder Chief Complaint/Reason for Consult: fall  HPI:  Mary Rice is an 86yo female PMH HTN, HLD, atrial fibrillation, hx PE/DVT on Eliquis, and recent admission for pneumonia who was admitted to [facility] 9/26 after suffering a fall at home on Friday 04/01/20. Denies syncope. States that she got up to use the bedside commode when she leaned forward too far and fell striking the heating vent. Denies hitting her head or LOC. Denies SOB. Complains only of left sided chest wall pain. Family was called to to help get her up. Due to persistent pain she was brought to the ED. Worked up by EDP and found to have acute left rib fractures 5-7, new emboli in the RLL of the lung and a large left atrial appendage thrombus. CT head negative for acute injury. Trauma asked to see for assistance with management of rib fractures.  Nonsmoker Lives at home with her husband, daughter lives close by Uses walker for ambulation at times  Review of Systems  Constitutional: Negative.   Respiratory: Negative for cough, shortness of breath and wheezing.   Cardiovascular: Positive for chest pain and leg swelling.  Gastrointestinal: Negative.   Genitourinary: Negative.   Musculoskeletal: Positive for falls. Negative for back pain and neck pain.   All systems reviewed and otherwise negative except for as above  Family History  Problem Relation Age of Onset  . Heart failure Mother     Past Medical History:  Diagnosis Date  . DVT (deep venous thrombosis) ([place])   . Dysrhythmia   . Hiatal hernia   . Hyperlipemia   . Hypertension   . Pulmonary embolism [facility])     Past Surgical History:  Procedure Laterality Date  . ABDOMINAL HYSTERECTOMY    . CATARACT EXTRACTION    . CHOLECYSTECTOMY    . ESOPHAGOGASTRODUODENOSCOPY N/A 08/19/2015   Procedure: ESOPHAGOGASTRODUODENOSCOPY (EGD);  Surgeon: Mauri Pole, MD;   Location: [facility] ENDOSCOPY;  Service: Endoscopy;  Laterality: N/A;  . KYPHOPLASTY N/A 02/26/2020   Procedure: KYPHOPLASTY T11;  Surgeon: Hessie Knows, MD;  Location: [facility] ORS;  Service: Orthopedics;  Laterality: N/A;    Social History:  reports that she has never smoked. She has never used smokeless tobacco. She reports that she does not drink alcohol and does not use drugs.  Allergies:  Allergies  Allergen Reactions  . Biaxin [Clarithromycin] Other (See Comments)    Reaction unknown  . Maxzide [Hydrochlorothiazide W-Triamterene] Other (See Comments)    Reaction unknown  . Penicillins Rash  . Sulfa Antibiotics Rash    Medications Prior to Admission  Medication Sig Dispense Refill  . acetaminophen (TYLENOL) 650 MG CR tablet Take 650 mg by mouth every 4 (four) hours as needed for pain.     . Ascorbic Acid (VITAMIN C) 250 MG CHEW Chew 750 mg by mouth daily.     Marland Kitchen aspirin EC 325 MG tablet Take 325 mg by mouth daily.    Marland Kitchen atenolol (TENORMIN) 25 MG tablet Take 25 mg by mouth daily.    Marland Kitchen buPROPion (WELLBUTRIN XL) 150 MG 24 hr tablet Take 150 mg by mouth at bedtime.     . calcium carbonate (TUMS - DOSED IN MG ELEMENTAL CALCIUM) 500 MG chewable tablet Chew 1 tablet by mouth daily as needed for indigestion or heartburn.    . Calcium-Vitamin D-Vitamin K (CALCIUM + D + K PO) Take 1 tablet by mouth  2 (two) times daily.    . Cholecalciferol 50 MCG (2000 UT) TABS Take 2,000 Units by mouth daily.    Marland Kitchen docusate sodium (COLACE) 100 MG capsule Take 300 mg by mouth at bedtime.     . Multiple Vitamin (MULTIVITAMIN WITH MINERALS) TABS tablet Take 1 tablet by mouth daily.    . pantoprazole (PROTONIX) 40 MG tablet Take 40 mg by mouth at bedtime.     . polyethylene glycol (MIRALAX / GLYCOLAX) 17 g packet Take 17 g by mouth daily.     . pravastatin (PRAVACHOL) 20 MG tablet Take 20 mg by mouth at bedtime.     . sodium chloride 1 g tablet Take 1 g by mouth 2 (two) times daily with a meal.     . mirabegron ER  (MYRBETRIQ) 25 MG TB24 tablet Take 1 tablet (25 mg total) by mouth daily. (Patient not taking: Reported on 04/03/2020) 30 tablet 1    Prior to Admission medications   Medication Sig Start Date End Date Taking? Authorizing Provider  acetaminophen (TYLENOL) 650 MG CR tablet Take 650 mg by mouth every 4 (four) hours as needed for pain.    Yes [provider]  Ascorbic Acid (VITAMIN C) 250 MG CHEW Chew 750 mg by mouth daily.    Yes [provider]  aspirin EC 325 MG tablet Take 325 mg by mouth daily.   Yes [provider]  atenolol (TENORMIN) 25 MG tablet Take 25 mg by mouth daily. 08/09/15  Yes [provider]  buPROPion (WELLBUTRIN XL) 150 MG 24 hr tablet Take 150 mg by mouth at bedtime.    Yes [provider]  calcium carbonate (TUMS - DOSED IN MG ELEMENTAL CALCIUM) 500 MG chewable tablet Chew 1 tablet by mouth daily as needed for indigestion or heartburn.   Yes [provider]  Calcium-Vitamin D-Vitamin K (CALCIUM + D + K PO) Take 1 tablet by mouth 2 (two) times daily.   Yes [provider]  Cholecalciferol 50 MCG (2000 UT) TABS Take 2,000 Units by mouth daily.   Yes [provider]  docusate sodium (COLACE) 100 MG capsule Take 300 mg by mouth at bedtime.    Yes [provider]  Multiple Vitamin (MULTIVITAMIN WITH MINERALS) TABS tablet Take 1 tablet by mouth daily.   Yes [provider]  pantoprazole (PROTONIX) 40 MG tablet Take 40 mg by mouth at bedtime.    Yes [provider]  polyethylene glycol (MIRALAX / GLYCOLAX) 17 g packet Take 17 g by mouth daily.    Yes [provider]  pravastatin (PRAVACHOL) 20 MG tablet Take 20 mg by mouth at bedtime.    Yes [provider]  sodium chloride 1 g tablet Take 1 g by mouth 2 (two) times daily with a meal.    Yes [provider]  mirabegron ER (MYRBETRIQ) 25 MG TB24 tablet Take 1 tablet (25 mg total) by mouth daily. Patient not  taking: Reported on 04/03/2020 03/24/20   Eugenie Filler, MD    Blood pressure (!) 123/48, pulse (!) 57, temperature 97.8 F (36.6 C), temperature source Oral, resp. rate 17, height 5\' 8"  (1.727 m), weight 89.8 kg, SpO2 96 %. Physical Exam: General: pleasant, elderly female who is laying in bed in NAD HEENT: head is normocephalic, atraumatic.  Sclera are noninjected.  PERRL.  Ears and nose without any masses or lesions.  Mouth is pink and moist. Dentition fair Heart: irregular. No obvious murmurs, gallops, or  rubs noted.  Palpable pedal pulses bilaterally  Lungs: CTAB, no wheezes, rhonchi, or rales noted.  Respiratory effort nonlabored on room air, oxygen sats upper 90's. Pulling 600 on IS Abd: soft, NT/ND, +BS, no masses, hernias, or organomegaly MS: calves soft and nontender, trace BLE edema Skin: warm and dry with no masses, lesions, or rashes Psych: A&Ox3 with an appropriate affect Neuro: cranial nerves grossly intact, equal strength in BUE/BLE bilaterally, normal speech, thought process intact  Results for orders placed or performed during the hospital encounter of 04/03/20 (from the past 48 hour(s))  Urinalysis, Routine w reflex microscopic Urine, Catheterized     Status: Abnormal   Collection Time: 04/03/20  2:10 AM  Result Value Ref Range   Color, Urine YELLOW YELLOW   APPearance HAZY (A) CLEAR   Specific Gravity, Urine 1.029 1.005 - 1.030   pH 5.0 5.0 - 8.0   Glucose, UA NEGATIVE NEGATIVE mg/dL   Hgb urine dipstick NEGATIVE NEGATIVE   Bilirubin Urine NEGATIVE NEGATIVE   Ketones, ur NEGATIVE NEGATIVE mg/dL   Protein, ur 100 (A) NEGATIVE mg/dL   Nitrite NEGATIVE NEGATIVE   Leukocytes,Ua MODERATE (A) NEGATIVE   RBC / HPF 11-20 0 - 5 RBC/hpf   WBC, UA 11-20 0 - 5 WBC/hpf   Bacteria, UA NONE SEEN NONE SEEN   Squamous Epithelial / LPF 0-5 0 - 5   Mucus PRESENT    Hyaline Casts, UA PRESENT    Ca Oxalate Crys, UA PRESENT     Comment: Performed at [facility], [place] [address] [place], [place] [address]  CBC with Differential     Status: Abnormal   Collection Time: 04/03/20  5:26 AM  Result Value Ref Range   WBC 9.1 4.0 - 10.5 K/uL   RBC 4.49 3.87 - 5.11 MIL/uL   Hemoglobin 13.5 12.0 - 15.0 g/dL   HCT 40.8 36 - 46 %   MCV 90.9 80.0 - 100.0 fL   MCH 30.1 26.0 - 34.0 pg   MCHC 33.1 30.0 - 36.0 g/dL   RDW 17.0 (H) 11.5 - 15.5 %   Platelets 150 150 - 400 K/uL   nRBC 0.0 0.0 - 0.2 %   Neutrophils Relative % 82 %   Neutro Abs 7.5 1.7 - 7.7 K/uL   Lymphocytes Relative 5 %   Lymphs Abs 0.5 (L) 0.7 - 4.0 K/uL   Monocytes Relative 11 %   Monocytes Absolute 1.0 0 - 1 K/uL   Eosinophils Relative 1 %   Eosinophils Absolute 0.1 0 - 0 K/uL   Basophils Relative 1 %   Basophils Absolute 0.1 0 - 0 K/uL   Immature Granulocytes 0 %   Abs Immature Granulocytes 0.04 0.00 - 0.07 K/uL    Comment: Performed at [facility], [place] [address], [place], [place] [address]  Basic metabolic panel     Status: Abnormal   Collection Time: 04/03/20  5:26 AM  Result Value Ref Range   Sodium 133 (L) 135 - 145 mmol/L   Potassium 3.4 (L) 3.5 - 5.1 mmol/L   Chloride 98 98 - 111 mmol/L   CO2 27 22 - 32 mmol/L   Glucose, Bld 125 (H) 70 - 99 mg/dL    Comment: Glucose reference range applies only to samples taken after fasting for at least 8 hours.   BUN 13 8 - 23 mg/dL   Creatinine, Ser 0.72 0.44 - 1.00 mg/dL   Calcium 9.0 8.9 - 10.3 mg/dL  GFR calc non Af Amer >60 >60 mL/min   GFR calc Af Amer >60 >60 mL/min   Anion gap 8 5 - 15    Comment: Performed at [facility], [place] [address] [place], [place] [address]  Magnesium     Status: None   Collection Time: 04/03/20  5:26 AM  Result Value Ref Range   Magnesium 2.0 1.7 - 2.4 mg/dL    Comment: Performed at [facility], [place] [address] [place], [place] [address]  Respiratory Panel by RT PCR (Flu A&B, Covid) - Nasopharyngeal Swab     Status: None    Collection Time: 04/03/20  6:55 AM   Specimen: Nasopharyngeal Swab  Result Value Ref Range   SARS Coronavirus 2 by RT PCR NEGATIVE NEGATIVE    Comment: (NOTE) SARS-CoV-2 target nucleic acids are NOT DETECTED.  The SARS-CoV-2 RNA is generally detectable in upper respiratoy specimens during the acute phase of infection. The lowest concentration of SARS-CoV-2 viral copies this assay can detect is 131 copies/mL. A negative result does not preclude SARS-Cov-2 infection and should not be used as the sole basis for treatment or other patient management decisions. A negative result may occur with  improper specimen collection/handling, submission of specimen other than nasopharyngeal swab, presence of viral mutation(s) within the areas targeted by this assay, and inadequate number of viral copies (<131 copies/mL). A negative result must be combined with clinical observations, patient history, and epidemiological information. The expected result is Negative.  Fact Sheet for Patients:  PinkCheek.be  Fact Sheet for Healthcare Providers:  GravelBags.it  This test is no t yet approved or cleared by the [place] FDA and  has been authorized for detection and/or diagnosis of SARS-CoV-2 by FDA under an Emergency Use Authorization (EUA). This EUA will remain  in effect (meaning this test can be used) for the duration of the COVID-19 declaration under Section 564(b)(1) of the Act, 21 U.S.C. section 360bbb-3(b)(1), unless the authorization is terminated or revoked sooner.     Influenza A by PCR NEGATIVE NEGATIVE   Influenza B by PCR NEGATIVE NEGATIVE    Comment: (NOTE) The Xpert Xpress SARS-CoV-2/FLU/RSV assay is intended as an aid in  the diagnosis of influenza from Nasopharyngeal swab specimens and  should not be used as a sole basis for treatment. Nasal washings and  aspirates are unacceptable for Xpert Xpress SARS-CoV-2/FLU/RSV   testing.  Fact Sheet for Patients: PinkCheek.be  Fact Sheet for Healthcare Providers: GravelBags.it  This test is not yet approved or cleared by the [place] FDA and  has been authorized for detection and/or diagnosis of SARS-CoV-2 by  FDA under an Emergency Use Authorization (EUA). This EUA will remain  in effect (meaning this test can be used) for the duration of the  Covid-19 declaration under Section 564(b)(1) of the Act, 21  U.S.C. section 360bbb-3(b)(1), unless the authorization is  terminated or revoked. Performed at [facility], [place] [address], [place], [place] [address]   Heparin level (unfractionated)     Status: Abnormal   Collection Time: 04/03/20  9:56 AM  Result Value Ref Range   Heparin Unfractionated <0.10 (L) 0.30 - 0.70 IU/mL    Comment: (NOTE) If heparin results are below expected values, and patient dosage has  been confirmed, suggest follow up testing of antithrombin III levels. Performed at [facility], [place] [address] [place], [place] [address]   APTT     Status: None   Collection Time:  04/03/20  9:56 AM  Result Value Ref Range   aPTT 32 24 - 36 seconds    Comment: Performed at [facility], [place] [address], [place], [place] [address]  Heparin level (unfractionated)     Status: Abnormal   Collection Time: 04/03/20  8:00 PM  Result Value Ref Range   Heparin Unfractionated 0.74 (H) 0.30 - 0.70 IU/mL    Comment: (NOTE) If heparin results are below expected values, and patient dosage has  been confirmed, suggest follow up testing of antithrombin III levels. Performed at [facility], [place] [address], [place], [place] [address]   MRSA PCR Screening     Status: None   Collection Time: 04/03/20  9:30 PM   Specimen: Nasal Mucosa; Nasopharyngeal  Result Value Ref Range   MRSA by PCR NEGATIVE NEGATIVE    Comment:         The GeneXpert MRSA Assay (FDA approved for NASAL specimens only), is one component of a comprehensive MRSA colonization surveillance program. It is not intended to diagnose MRSA infection nor to guide or monitor treatment for MRSA infections. Performed at [facility], [place] [address], [place], [place] [address]    DG Ribs Unilateral W/Chest Left  Result Date: 04/03/2020 CLINICAL DATA:  Initial evaluation for acute left-sided thoracic and back pain status post recent fall. EXAM: LEFT RIBS AND CHEST - 3+ VIEW COMPARISON:  Prior radiograph from 03/21/2020. FINDINGS: Mild cardiomegaly, stable. Mediastinal silhouette within normal limits. Aortic atherosclerosis. Lungs are hypoinflated. Small left pleural effusion. Associated patchy bibasilar opacities favored to reflect atelectasis. No visible pneumothorax. Dedicated views of the left ribs were performed. There are acute mildly displaced fractures of the left fifth, 6, seventh, and likely eighth ribs. Thoracolumbar scoliosis with sequelae of prior thoracic augmentation noted. IMPRESSION: 1. Acute mildly displaced fractures of the left fifth, sixth, seventh, and likely eighth ribs. 2. Small left pleural effusion with associated bibasilar atelectasis. 3.  Aortic Atherosclerosis (ICD10-I70.0). Electronically Signed   By: Jeannine Boga M.D.   On: 04/03/2020 04:39   CT Head Wo Contrast  Result Date: 04/03/2020 CLINICAL DATA:  Head trauma. EXAM: CT HEAD WITHOUT CONTRAST TECHNIQUE: Contiguous axial images were obtained from the base of the skull through the vertex without intravenous contrast. COMPARISON:  03/21/2020 FINDINGS: Brain: No evidence of acute large vascular territory infarction, hemorrhage, hydrocephalus, extra-axial collection or mass lesion/mass effect. Similar patchy white matter hypoattenuation, compatible with the sequela of chronic microvascular ischemic disease. Similar mild diffuse cerebral atrophy with ex vacuo ventricular  dilation. Vascular: Calcific atherosclerosis. Skull: Normal. Negative for fracture or focal lesion. Sinuses/Orbits: No acute finding. Other: No mastoid effusions. IMPRESSION: No evidence of acute intracranial abnormality. Electronically Signed   By: Margaretha Sheffield MD   On: 04/03/2020 08:33   CT Chest W Contrast  Result Date: 04/03/2020 CLINICAL DATA:  Abdominal trauma.  Fall with back pain EXAM: CT CHEST, ABDOMEN, AND PELVIS WITH CONTRAST TECHNIQUE: Multidetector CT imaging of the chest, abdomen and pelvis was performed following the standard protocol during bolus administration of intravenous contrast. CONTRAST:  129mL OMNIPAQUE IOHEXOL 300 MG/ML  SOLN COMPARISON:  03/14/2020 FINDINGS: CT CHEST FINDINGS Cardiovascular: Prominent by biatrial size. Low-density filling defect is seen at the left atrial appendage measuring 2.6 cm. Newly seen right lower lobe emboli involving the lobar to segmental vessels. No pericardial effusion. Mediastinum/Nodes: No hematoma or pneumomediastinum. Lungs/Pleura: Atelectasis at the lung bases. Trace left pleural effusion which is low-density appearing Musculoskeletal: Acute appearing lateral  left fifth, sixth, seventh rib fractures. Remote anterior left fourth, fifth, and sixth rib fractures with callus. Remote anterior right rib fractures. CT ABDOMEN PELVIS FINDINGS Hepatobiliary: No hepatic injury or perihepatic hematoma. Gallbladder is absent Pancreas: Prominent fatty atrophy. Single calcification at the head which appears separate from the course of the distal CBD Spleen: No splenic injury or perisplenic hematoma. Adrenals/Urinary Tract: No adrenal hemorrhage or renal injury identified. Bladder is collapsed around a Foley catheter Stomach/Bowel: No evidence of injury. Distal colonic diverticulosis. Vascular/Lymphatic: Atheromatous calcifications. No acute vascular finding. Reproductive: Hysterectomy Other: Mixed density presacral mass which measures 4 cm in diameter. No  visible involvement of the sacrum or adjacent nerve roots. There are areas of fatty density within the mass, suggesting myelolipoma - which occur in this area. Musculoskeletal: Scoliosis and diffuse degenerative disease. Prior T11 compression fracture and cement augmentation. Critical Value/emergent results were called by telephone at the time of interpretation on 04/03/2020 at 8:43 am to Dr Gustavus Messing , who verbally acknowledged these results. IMPRESSION: 1. Large thrombus in the left atrial appendage. 2. Lobar and segmental right lower lobe pulmonary emboli new from CTA 3 weeks ago. 3. Acute left fifth, sixth, and seventh rib fractures. Remote rib fractures on both sides. There is atelectasis and trace left pleural fluid. No pneumothorax. 4. 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 Recommend imaging follow-up to confirm stability. Electronically Signed   By: Monte Fantasia M.D.   On: 04/03/2020 08:47   CT ABDOMEN PELVIS W CONTRAST  Result Date: 04/03/2020 CLINICAL DATA:  Abdominal trauma.  Fall with back pain EXAM: CT CHEST, ABDOMEN, AND PELVIS WITH CONTRAST TECHNIQUE: Multidetector CT imaging of the chest, abdomen and pelvis was performed following the standard protocol during bolus administration of intravenous contrast. CONTRAST:  148mL OMNIPAQUE IOHEXOL 300 MG/ML  SOLN COMPARISON:  03/14/2020 FINDINGS: CT CHEST FINDINGS Cardiovascular: Prominent by biatrial size. Low-density filling defect is seen at the left atrial appendage measuring 2.6 cm. Newly seen right lower lobe emboli involving the lobar to segmental vessels. No pericardial effusion. Mediastinum/Nodes: No hematoma or pneumomediastinum. Lungs/Pleura: Atelectasis at the lung bases. Trace left pleural effusion which is low-density appearing Musculoskeletal: Acute appearing lateral left fifth, sixth, seventh rib fractures. Remote anterior left fourth, fifth, and sixth rib fractures with callus. Remote anterior  right rib fractures. CT ABDOMEN PELVIS FINDINGS Hepatobiliary: No hepatic injury or perihepatic hematoma. Gallbladder is absent Pancreas: Prominent fatty atrophy. Single calcification at the head which appears separate from the course of the distal CBD Spleen: No splenic injury or perisplenic hematoma. Adrenals/Urinary Tract: No adrenal hemorrhage or renal injury identified. Bladder is collapsed around a Foley catheter Stomach/Bowel: No evidence of injury. Distal colonic diverticulosis. Vascular/Lymphatic: Atheromatous calcifications. No acute vascular finding. Reproductive: Hysterectomy Other: Mixed density presacral mass which measures 4 cm in diameter. No visible involvement of the sacrum or adjacent nerve roots. There are areas of fatty density within the mass, suggesting myelolipoma - which occur in this area. Musculoskeletal: Scoliosis and diffuse degenerative disease. Prior T11 compression fracture and cement augmentation. Critical Value/emergent results were called by telephone at the time of interpretation on 04/03/2020 at 8:43 am to Dr Gustavus Messing , who verbally acknowledged these results. IMPRESSION: 1. Large thrombus in the left atrial appendage. 2. Lobar and segmental right lower lobe pulmonary emboli new from CTA 3 weeks ago. 3. Acute left fifth, sixth, and seventh rib fractures. Remote rib fractures on both sides. There is atelectasis and trace left pleural fluid. No  pneumothorax. 4. 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 Recommend imaging follow-up to confirm stability. Electronically Signed   By: Monte Fantasia M.D.   On: 04/03/2020 08:47   DG CHEST PORT 1 VIEW  Result Date: 04/04/2020 CLINICAL DATA:  Rib fractures. EXAM: PORTABLE CHEST 1 VIEW COMPARISON:  CT 04/03/2020.  Chest x-ray 03/21/2020. FINDINGS: Mediastinum hilar structures normal. Heart size stable. Persistent left base atelectasis/infiltrate small left pleural effusion. No pneumothorax. Multiple  rib fractures best identified by prior CT. IMPRESSION: 1. Persistent left base atelectasis/infiltrate and small left pleural effusion. No interim change. No pneumothorax. 2. Multiple rib fractures best identified by prior CT. Electronically Signed   By: Marcello Moores  Register   On: 04/04/2020 05:55   Anti-infectives (From admission, onward)   None        Assessment/Plan HTN HLD Anxiety Atrial fibrillation Hx PE/DVT on Eliquis Recent PNA New RLL PE and large LE atrial appendage thrombus - on IV heparin  Fall Left rib fxs 5-7 - Recommend multimodal pain control and pulmonary toilet/IS. Scheduled tylenol, robaxin, gabapentin, and lidoderm patch. PRN oxycodone. PT/OT consults pending.  Further management per primary team. Trauma will sign off, please call with concerns.   ID - none VTE - IV heparin FEN - HH diet Foley - in place Follow up - TBD  [place], [facility] 04/04/2020, 8:10 AM Please see Amion for pager number during day hours 7:00am-4:30pm

## 2020-04-05 DIAGNOSIS — I4821 Permanent atrial fibrillation: Secondary | ICD-10-CM

## 2020-04-05 LAB — GLUCOSE, CAPILLARY
Glucose-Capillary: 114 mg/dL — ABNORMAL HIGH (ref 70–99)
Glucose-Capillary: 127 mg/dL — ABNORMAL HIGH (ref 70–99)
Glucose-Capillary: 117 mg/dL — ABNORMAL HIGH (ref 70–99)
Glucose-Capillary: 136 mg/dL — ABNORMAL HIGH (ref 70–99)

## 2020-04-05 LAB — CBC
HCT: 40.7 % (ref 36.0–46.0)
Hemoglobin: 13.3 g/dL (ref 12.0–15.0)
MCH: 30.5 pg (ref 26.0–34.0)
MCHC: 32.7 g/dL (ref 30.0–36.0)
MCV: 93.3 fL (ref 80.0–100.0)
Platelets: 183 10*3/uL (ref 150–400)
RBC: 4.36 MIL/uL (ref 3.87–5.11)
RDW: 17.2 % — ABNORMAL HIGH (ref 11.5–15.5)
WBC: 7.3 10*3/uL (ref 4.0–10.5)
nRBC: 0 % (ref 0.0–0.2)

## 2020-04-05 MED ORDER — APIXABAN 5 MG PO TABS
10.0000 mg | ORAL_TABLET | Freq: Two times a day (BID) | ORAL | Status: DC
  Administered 2020-04-05 – 2020-04-06 (×3): 10 mg via ORAL
  Filled 2020-04-05 (×3): qty 2

## 2020-04-05 MED ORDER — APIXABAN 5 MG PO TABS: 5.0000 mg | ORAL_TABLET | Freq: Two times a day (BID) | ORAL | Status: DC

## 2020-04-05 MED ORDER — ATENOLOL 25 MG PO TABS
12.5000 mg | ORAL_TABLET | Freq: Every day | ORAL | Status: DC
  Administered 2020-04-06: 12.5 mg via ORAL
  Filled 2020-04-05: qty 1

## 2020-04-05 NOTE — Discharge Instructions (Addendum)
Rib Fracture  A rib fracture is a break or crack in one of the bones of the ribs. The ribs are like a cage that goes around your upper chest. A broken or cracked rib is often painful, but most do not cause other problems. Most rib fractures usually heal on their own in 1-3 months. Follow these instructions at home: Managing pain, stiffness, and swelling  If directed, apply ice to the injured area. ? Put ice in a plastic bag. ? Place a towel between your skin and the bag. ? Leave the ice on for 20 minutes, 2-3 times a day.  Take over-the-counter and prescription medicines only as told by your doctor. Activity  Avoid activities that cause pain to the injured area. Protect your injured area.  Slowly increase activity as told by your doctor. General instructions  Do deep breathing as told by your doctor. You may be told to: ? Take deep breaths many times a day. ? Cough many times a day while hugging a pillow. ? Use a device (incentive spirometer) to do deep breathing many times a day.  Drink enough fluid to keep your pee (urine) clear or pale yellow.  Do not wear a rib belt or binder. These do not allow you to breathe deeply.  Keep all follow-up visits as told by your doctor. This is important. Contact a doctor if:  You have a fever. Get help right away if:  You have trouble breathing.  You are short of breath.  You cannot stop coughing.  You cough up thick or bloody spit (sputum).  You feel sick to your stomach (nauseous), throw up (vomit), or have belly (abdominal) pain.  Your pain gets worse and medicine does not help. Summary  A rib fracture is a break or crack in one of the bones of the ribs.  Apply ice to the injured area and take medicines for pain as told by your doctor.  Take deep breaths and cough many times a day. Hug a pillow every time you cough. This information is not intended to replace advice given to you by your health care provider. Make sure you  discuss any questions you have with your health care provider. Document Revised: 06/07/2017 Document Reviewed: 09/25/2016 Elsevier Patient Education  [place].   Rib Fracture  A rib fracture is a break or crack in one of the bones of the ribs. The ribs are long, curved bones that wrap around your chest and attach to your spine and your breastbone. The ribs protect your heart, lungs, and other organs in the chest. A broken or cracked rib is often painful but is not usually serious. Most rib fractures heal on their own over time. However, rib fractures can be more serious if multiple ribs are broken or if broken ribs move out of place and push against other structures or organs. What are the causes? This condition is caused by:  Repetitive movements with high force, such as pitching a baseball or having severe coughing spells.  A direct blow to the chest, such as a sports injury, a car accident, or a fall.  Cancer that has spread to the bones, which can weaken bones and cause them to break. What are the signs or symptoms? Symptoms of this condition include:  Pain when you breathe in or cough.  Pain when someone presses on the injured area.  Feeling short of breath. How is this diagnosed? This condition is diagnosed with a physical exam and  medical history. Imaging tests may also be done, such as:  Chest X-ray.  CT scan.  MRI.  Bone scan.  Chest ultrasound. How is this treated? Treatment for this condition depends on the severity of the fracture. Most rib fractures usually heal on their own in 1-3 months. Sometimes healing takes longer if there is a cough that does not stop or if there are other activities that make the injury worse (aggravating factors). While you heal, you will be given medicines to control the pain. You will also be taught deep breathing exercises. Severe injuries may require hospitalization or surgery. Follow these instructions at home: Managing  pain, stiffness, and swelling  If directed, apply ice to the injured area. ? Put ice in a plastic bag. ? Place a towel between your skin and the bag. ? Leave the ice on for 20 minutes, 2-3 times a day.  Take over-the-counter and prescription medicines only as told by your health care provider. Activity  Avoid a lot of activity and any activities or movements that cause pain. Be careful during activities and avoid bumping the injured rib.  Slowly increase your activity as told by your health care provider. General instructions  Do deep breathing exercises as told by your health care provider. This helps prevent pneumonia, which is a common complication of a broken rib. Your health care provider may instruct you to: ? Take deep breaths several times a day. ? Try to cough several times a day, holding a pillow against the injured area. ? Use a device called incentive spirometer to practice deep breathing several times a day.  Drink enough fluid to keep your urine pale yellow.  Do not wear a rib belt or binder. These restrict breathing, which can lead to pneumonia.  Keep all follow-up visits as told by your health care provider. This is important. Contact a health care provider if:  You have a fever. Get help right away if:  You have difficulty breathing or you are short of breath.  You develop a cough that does not stop, or you cough up thick or bloody sputum.  You have nausea, vomiting, or pain in your abdomen.  Your pain gets worse and medicine does not help. Summary  A rib fracture is a break or crack in one of the bones of the ribs.  A broken or cracked rib is often painful but is not usually serious.  Most rib fractures heal on their own over time.  Treatment for this condition depends on the severity of the fracture.  Avoid a lot of activity and any activities or movements that cause pain. This information is not intended to replace advice given to you by your health  care provider. Make sure you discuss any questions you have with your health care provider. Document Revised: 06/07/2017 Document Reviewed: 09/24/2016 Elsevier Patient Education  [place] Revised: 06/07/2017 Document Reviewed: 09/25/2016 Elsevier Patient Education  2020 [place] on my medicine - ELIQUIS (apixaban)  This medication education was reviewed with me or my healthcare representative as part of my discharge preparation.    Why was Eliquis prescribed for you? Eliquis was prescribed to treat blood clots that may have been found in the veins of your legs (deep vein thrombosis) or in your lungs (pulmonary embolism) and to reduce the risk of them occurring again.  What do You need to know about Eliquis ? The starting dose is 10 mg (two 5 mg tablets) taken TWICE daily  for the FIRST SEVEN (7) DAYS, then on 04/12/20  the dose is reduced to ONE 5 mg tablet taken TWICE daily.  Eliquis may be taken with or without food.   Try to take the dose about the same time in the morning and in the evening. If you have difficulty swallowing the tablet whole please discuss with your pharmacist how to take the medication safely.  Take Eliquis exactly as prescribed and DO NOT stop taking Eliquis without talking to the doctor who prescribed the medication.  Stopping may increase your risk of developing a new blood clot.  Refill your prescription before you run out.  After discharge, you should have regular check-up appointments with your healthcare provider that is prescribing your Eliquis.    What do you do if you miss a dose? If a dose of ELIQUIS is not taken at the scheduled time, take it as soon as possible on the same day and twice-daily administration should be resumed. The dose should not be doubled to make up for a missed dose.  Important Safety Information A possible side effect of Eliquis is bleeding. You should call your healthcare provider right  away if you experience any of the following: ? Bleeding from an injury or your nose that does not stop. ? Unusual colored urine (red or dark brown) or unusual colored stools (red or black). ? Unusual bruising for unknown reasons. ? A serious fall or if you hit your head (even if there is no bleeding).  Some medicines may interact with Eliquis and might increase your risk of bleeding or clotting while on Eliquis. To help avoid this, consult your healthcare provider or pharmacist prior to using any new prescription or non-prescription medications, including herbals, vitamins, non-steroidal anti-inflammatory drugs (NSAIDs) and supplements.  This website has more information on Eliquis (apixaban): http://www.eliquis.com/eliquis/home

## 2020-04-05 NOTE — TOC Benefit Eligibility Note (Signed)
Transition of Care [facility]) Benefit Eligibility Note    Patient Details  Name: Mary Rice MRN: [id number] Date of Birth: Mar 23, 1933   Medication/Dose: Arne [place]  5 MG BID  Covered?: Yes  Tier:  (NO TIER)  Prescription Coverage Preferred Pharmacy: [facility] with Person/Company/Phone Number:: MIKE  @ [place] [employer] # [phone]  Co-Pay: $33.00  Prior Approval: No  Deductible: Met       Memory Argue Phone Number: 04/05/2020, 4:13 PM

## 2020-04-05 NOTE — Progress Notes (Signed)
ANTICOAGULATION CONSULT NOTE - Follow Up Consult  Pharmacy Consult for Lovenox >> Apixaban Indication: Acute pulmonary embolus and left atrial thrombus  Patient Measurements: Height: 5\' 8"  (172.7 cm) Weight: 89.8 kg (198 lb) IBW/kg (Calculated) : 63.9 Heparin Dosing Weight: 83 kg  Vital Signs: Temp: 98.1 F (36.7 C) (09/28 0724) Temp Source: Oral (09/28 0724) BP: 136/70 (09/28 0724) Pulse Rate: 67 (09/28 0724)  Labs: Recent Labs    04/03/20 0526 04/03/20 0956 04/03/20 2000 04/04/20 1829  HGB 13.5  --   --  13.2  HCT 40.8  --   --  41.0  PLT 150  --   --  185  APTT  --  32  --   --   HEPARINUNFRC  --  <0.10* 0.74*  --   CREATININE 0.72  --   --  0.91    Estimated Creatinine Clearance: 53 mL/min (by C-G formula based on SCr of 0.91 mg/dL).   Assessment: Patient is an 86 y.o F with hx DVT/PE and afib with discharged summary on 03/15/20 indicating she was taken off Eliquis with this admission due to fall risk.  She presented to the ED on 9/26 s/p fall on 9/24 with c/o chest pain.  Pharmacy was initially consulted to start heparin drip for acute PE and atrial thrombus >> now to transition to Apixaban.  - 9/26 chest CTA: Large thrombus in the left atrial appendage. Lobar and segmental right lower lobe pulmonary emboli new from CTA 3 weeks ago. Acute left fifth, sixth, and seventh rib fractures - 9/26 head CT: no acute findings  The patient last received Lovenox 1 mg/kg at 1630 on 9/27 - will do full treatment dosing due to new acute PE. Will plan to re-educate prior to discharge.  Goal of Therapy:  Appropriate anticoagulation for indication and hepatic/renal function    Plan:  - D/c Lovenox - Start Apixaban 10 mg bid x 7 days followed by 5 mg bid - Will monitor for bleeding and plan to re-educate prior to discharge  Thank you for allowing pharmacy to be a part of this patient's care.  Alycia Rossetti, PharmD, BCPS Clinical Pharmacist Clinical phone for 04/05/2020:  [phone] 04/05/2020 9:11 AM   **Pharmacist phone directory can now be found on [email or web address] ([email or web address]).  Listed under [place].

## 2020-04-05 NOTE — Progress Notes (Addendum)
Progress Note  Patient Name: Mary Rice Date of Encounter: 04/05/2020  [facility] Cardiologist: Ida Rogue, MD (2017)  Subjective   Pt tired this morning. No signs of bleeding. No dyspnea or dizziness.   Inpatient Medications    Scheduled Meds: . acetaminophen  650 mg Oral Q6H  . apixaban  10 mg Oral BID   Followed by  . [START ON 04/12/2020] apixaban  5 mg Oral BID  . atenolol  25 mg Oral Daily  . buPROPion  150 mg Oral Daily  . Chlorhexidine Gluconate Cloth  6 each Topical Daily  . docusate sodium  100 mg Oral QHS  . gabapentin  100 mg Oral TID  . lidocaine  1 patch Transdermal Q24H  . methocarbamol  500 mg Oral Q6H  . pantoprazole  40 mg Oral Daily  . polyethylene glycol  17 g Oral Daily  . pravastatin  20 mg Oral QHS  . sodium chloride flush  3 mL Intravenous Q12H  . sodium chloride  1 g Oral BID WC   Continuous Infusions:  PRN Meds: acetaminophen **OR** acetaminophen, calcium carbonate, HYDROmorphone (DILAUDID) injection, ondansetron **OR** ondansetron (ZOFRAN) IV, oxyCODONE   Vital Signs    Vitals:   04/05/20 0100 04/05/20 0338 04/05/20 0400 04/05/20 0724  BP:  (!) 119/52  136/70  Pulse: 70 62 64 67  Resp: 17 20 18 19   Temp:  98 F (36.7 C)  98.1 F (36.7 C)  TempSrc:  Oral  Oral  SpO2: 93%  95% 95%  Weight:      Height:        Intake/Output Summary (Last 24 hours) at 04/05/2020 0936 Last data filed at 04/05/2020 8756 Gross per 24 hour  Intake 703 ml  Output 1800 ml  Net -1097 ml   Last 3 Weights 04/03/2020 03/21/2020 03/21/2020  Weight (lbs) 198 lb 198 lb 198 lb  Weight (kg) 89.812 kg 89.812 kg 89.812 kg      Telemetry    Afib with rates in the 60s, pauses of 2.0-2.2 seconds (asymptomatic) - Personally Reviewed  ECG    No new tracings - Personally Reviewed  Physical Exam   GEN: elderly female in no acute distress.   Neck: No JVD Cardiac: RRR, no murmurs, rubs, or gallops.  Respiratory: Clear to auscultation bilaterally. GI:  Soft, nontender, non-distended  MS: at least 1+ B LE edema; No deformity. Neuro:  Nonfocal  Psych: Normal affect   Labs    High Sensitivity Troponin:  No results for input(s): TROPONINIHS in the last 720 hours.    Chemistry Recent Labs  Lab 04/03/20 0526 04/04/20 1829  NA 133* 130*  K 3.4* 4.5  CL 98 99  CO2 27 20*  GLUCOSE 125* 112*  BUN 13 18  CREATININE 0.72 0.91  CALCIUM 9.0 8.4*  PROT  --  5.6*  ALBUMIN  --  3.0*  AST  --  20  ALT  --  16  ALKPHOS  --  62  BILITOT  --  0.8  GFRNONAA >60 58*  GFRAA >60 >60  ANIONGAP 8 11     Hematology Recent Labs  Lab 04/03/20 0526 04/04/20 1829  WBC 9.1 10.3  RBC 4.49 4.44  HGB 13.5 13.2  HCT 40.8 41.0  MCV 90.9 92.3  MCH 30.1 29.7  MCHC 33.1 32.2  RDW 17.0* 17.1*  PLT 150 185    BNPNo results for input(s): BNP, PROBNP in the last 168 hours.   DDimer No results for  input(s): DDIMER in the last 168 hours.   Radiology    DG CHEST PORT 1 VIEW  Result Date: 04/04/2020 CLINICAL DATA:  Rib fractures. EXAM: PORTABLE CHEST 1 VIEW COMPARISON:  CT 04/03/2020.  Chest x-ray 03/21/2020. FINDINGS: Mediastinum hilar structures normal. Heart size stable. Persistent left base atelectasis/infiltrate small left pleural effusion. No pneumothorax. Multiple rib fractures best identified by prior CT. IMPRESSION: 1. Persistent left base atelectasis/infiltrate and small left pleural effusion. No interim change. No pneumothorax. 2. Multiple rib fractures best identified by prior CT. Electronically Signed   By: Marcello Moores  Register   On: 04/04/2020 05:55   ECHOCARDIOGRAM COMPLETE  Result Date: 04/04/2020    ECHOCARDIOGRAM REPORT   Patient Name:   Mary Rice Date of Exam: 04/04/2020 Medical Rec #:  [id number]     Height:       68.0 in Accession #:    [id number]    Weight:       198.0 lb Date of Birth:  1933-04-30      BSA:          2.035 m Patient Age:    86 years      BP:           134/58 mmHg Patient Gender: F             HR:           69 bpm.  Exam Location:  Inpatient Procedure: 2D Echo, Color Doppler, Cardiac Doppler and Intracardiac            Opacification Agent Indications:    Atrial Fibrillation 427.31 / I48.91  History:        Patient has prior history of Echocardiogram examinations, most                 recent 10/27/2015. COPD, Arrythmias:Atrial Fibrillation; Risk                 Factors:Hypertension, Diabetes and Dyslipidemia. Pulmonary                 embolism.  Sonographer:    Vickie Epley RDCS Referring Phys: [id number] [place]  1. Left ventricular ejection fraction, by estimation, is 65 to 70%. The left ventricle has normal function. The left ventricle has no regional wall motion abnormalities. Left ventricular diastolic function could not be evaluated.  2. Right ventricular systolic function is normal. The right ventricular size is normal. There is mildly elevated pulmonary artery systolic pressure. The estimated right ventricular systolic pressure is 03.4 mmHg.  3. The mitral valve is normal in structure. No evidence of mitral valve regurgitation. No evidence of mitral stenosis.  4. Tricuspid valve regurgitation is moderate.  5. The aortic valve is normal in structure. There is moderate calcification of the aortic valve. There is moderate thickening of the aortic valve. Aortic valve regurgitation is trivial. Mild to moderate aortic valve sclerosis/calcification is present, without any evidence of aortic stenosis.  6. The inferior vena cava is normal in size with greater than 50% respiratory variability, suggesting right atrial pressure of 3 mmHg. FINDINGS  Left Ventricle: Left ventricular ejection fraction, by estimation, is 65 to 70%. The left ventricle has normal function. The left ventricle has no regional wall motion abnormalities. Definity contrast agent was given IV to delineate the left ventricular  endocardial borders. The left ventricular internal cavity size was normal in size. There is no left ventricular  hypertrophy. Left ventricular diastolic function could not be evaluated due to atrial  fibrillation. Left ventricular diastolic function could  not be evaluated. Right Ventricle: The right ventricular size is normal. No increase in right ventricular wall thickness. Right ventricular systolic function is normal. There is mildly elevated pulmonary artery systolic pressure. The tricuspid regurgitant velocity is 3.02  m/s, and with an assumed right atrial pressure of 3 mmHg, the estimated right ventricular systolic pressure is 63.8 mmHg. Left Atrium: Left atrial size was normal in size. Right Atrium: Right atrial size was normal in size. Pericardium: There is no evidence of pericardial effusion. Mitral Valve: The mitral valve is normal in structure. There is mild thickening of the mitral valve leaflet(s). Mild mitral annular calcification. No evidence of mitral valve regurgitation. No evidence of mitral valve stenosis. Tricuspid Valve: The tricuspid valve is normal in structure. Tricuspid valve regurgitation is moderate . No evidence of tricuspid stenosis. Aortic Valve: The aortic valve is normal in structure. There is moderate calcification of the aortic valve. There is moderate thickening of the aortic valve. Aortic valve regurgitation is trivial. Mild to moderate aortic valve sclerosis/calcification is present, without any evidence of aortic stenosis. Pulmonic Valve: The pulmonic valve was normal in structure. Pulmonic valve regurgitation is not visualized. No evidence of pulmonic stenosis. Aorta: The aortic root is normal in size and structure. Venous: The inferior vena cava is normal in size with greater than 50% respiratory variability, suggesting right atrial pressure of 3 mmHg. IAS/Shunts: No atrial level shunt detected by color flow Doppler.  LEFT VENTRICLE PLAX 2D LVIDd:         4.70 cm LVIDs:         3.90 cm LV PW:         0.70 cm LV IVS:        0.70 cm LVOT diam:     2.10 cm LV SV:         72 LV SV Index:    35 LVOT Area:     3.46 cm  LV Volumes (MOD) LV vol d, MOD A4C: 69.9 ml LV vol s, MOD A4C: 27.5 ml LV SV MOD A4C:     69.9 ml RIGHT VENTRICLE RV S prime:     7.62 cm/s TAPSE (M-mode): 1.5 cm LEFT ATRIUM           Index       RIGHT ATRIUM           Index LA diam:      3.70 cm 1.82 cm/m  RA Area:     15.70 cm LA Vol (A2C): 41.7 ml 20.49 ml/m RA Volume:   38.60 ml  18.97 ml/m LA Vol (A4C): 36.8 ml 18.08 ml/m  AORTIC VALVE LVOT Vmax:   101.00 cm/s LVOT Vmean:  70.700 cm/s LVOT VTI:    0.208 m  AORTA Ao Root diam: 2.90 cm TRICUSPID VALVE TR Peak grad:   36.5 mmHg TR Vmax:        302.00 cm/s  SHUNTS Systemic VTI:  0.21 m Systemic Diam: 2.10 cm Ena Dawley MD Electronically signed by Ena Dawley MD Signature Date/Time: 04/04/2020/1:09:18 PM    Final     Cardiac Studies   Echo 04/04/20: 1. Left ventricular ejection fraction, by estimation, is 65 to 70%. The  left ventricle has normal function. The left ventricle has no regional  wall motion abnormalities. Left ventricular diastolic function could not  be evaluated.  2. Right ventricular systolic function is normal. The right ventricular  size is normal. There is mildly elevated pulmonary artery systolic  pressure. The estimated right ventricular systolic pressure is 82.5 mmHg.  3. The mitral valve is normal in structure. No evidence of mitral valve  regurgitation. No evidence of mitral stenosis.  4. Tricuspid valve regurgitation is moderate.  5. The aortic valve is normal in structure. There is moderate  calcification of the aortic valve. There is moderate thickening of the  aortic valve. Aortic valve regurgitation is trivial. Mild to moderate  aortic valve sclerosis/calcification is present,  without any evidence of aortic stenosis.  6. The inferior vena cava is normal in size with greater than 50%  respiratory variability, suggesting right atrial pressure of 3 mmHg.   Patient Profile     86 y.o. female with a hx of permanent Afib,  PE/DVT, HTN, and HLD who is being seen for the evaluation of LAA thrombus and acute PE - found incidentally on CT chest obtained to evaluate rib pain after a mechanical fall.  Assessment & Plan    Left atrial appendage thrombus Pulmonary embolism Hx of DVT and bilateral PE (?2007 after knee surgery) - LAA thrombus noted on CT with no delayed imaging - had a long discussion with patient and family at bedside regarding risks and benefits of anticoagulation - she is high risk for stroke given her CHA2DS2-VASc Score and unadjusted Ischemic Stroke Rate (% per year) of 9.7 % stroke rate/year from a score of 6 (2age, female, 2PE/DVT, HTN) - HAD-BLED score 2 for age and full dose ASA - will stop full dose ASA in the setting of eliquis - pt started on treatment dose lovenox yesterday - H/H not drawn today - will order CBC to make sure stable - if CBC stable, transition to eliquis per primary   Lower extremity edema - states this is her baseline - recommended compression hose while in hospital - sat with her feet down for many hours yesterday, does not elevate her feet at home - suspect a component of diastolic dysfunction contributing - will hold off on lasix - do not want to give her a reason to rush to the bathroom and risk falling, especially if she is not dyspneic    Permanent Afib - rate controlled on atenolol - rates in the 60s, pauses 2.0-2.2 sec.  Reduce atenolol dose to 12.5 mg daily   Hyperlipidemia - continue pravastatin   Hypertension - continue atenolol   I will message our office for cardiology follow up.     For questions or updates, please contact [place] Please consult [email or web address] for contact info under     Signed, Ledora Bottcher, PA  04/05/2020, 9:36 AM    Patient seen and examined.  Agree with above documentation.  On exam, patient is alert and oriented, normal rate, irregular rhythm, no murmurs, lungs CTAB, 1+ lower extremity edema.  She denies any  chest pain or dyspnea.  Tolerating anticoagulation with Lovenox yesterday, hemoglobin stable.  Recommend transition to Eliquis today.  Remains in A. fib, rates controlled on atenolol.  Has had brief pauses/bradycardia.  Will reduce atenolol dose to 12.5 mg daily  Donato Heinz, MD

## 2020-04-05 NOTE — Progress Notes (Signed)
PROGRESS NOTE    Mary HABERKORN  ZJI:[id number] DOB: July 12, 1932 DOA: 04/03/2020 PCP: Rusty Aus, MD   Brief Narrative: 86 year old with past medical history significant for A. fib who presented with left flank chest pain after a fall.  She reports 2 days ago she was leaning over the edge of her bed to get to the [facility] and she fell forward into the radiator and hit her left side.  She denies LOC or head injury.  She remembers the entire fall.  Patient presented to the ED due to progressive and worsening pain.  Evaluation in the ED patient was found to have acute fractures of the left fifth, sixth and seventh ribs.  Imaging also showed new emboli in the right lower lobe of of the lung and a large left anterior appendage thrombus.  Trauma team has been consulted.   Assessment & Plan:   Active Problems:   Rib fractures  1-Multiple falls, acute left rib fractures (fifth sixth seventh) -Continue with incentive spirometry -Pulmonary toileting -Appreciate trauma evaluation  2-Acute right lower lobe PE: -She was a started on heparin, plan to transition to Lovenox.  If vitals continue to be stable plan to transition to Eliquis today.  -Eliquis dose for PE treatment.   3-Left atrial appendage thrombus: Cardiology consulted. Started on Eliquis.   4-A. fib: Continue with atenolol. Transition to Eliquis for treatment of PE and A. Fib.  Presacral mass;  CT: Nearly 4 cm presacral mass which has areas of fatty density, favoring myelolipoma. Recommend imaging follow-up to confirm Stability.  Hyponatremia; Monitor  Hypokalemia; Replete orally.  Hyperlipidemia: Continue with pravastatin  Anxiety: Continue with Wellbutrin   Estimated body mass index is 30.11 kg/m as calculated from the following:   Height as of this encounter: 5\' 8"  (1.727 m).   Weight as of this encounter: 89.8 kg.   DVT prophylaxis: Heparin transition to Lovenox Code Status: Partial code Family Communication:  daughter in law at bedside/9-27. Disposition Plan:  Status is: Inpatient  Remains inpatient appropriate because:IV treatments appropriate due to intensity of illness or inability to take PO   Dispo: The patient is from: Home              Anticipated d/c is to: To be determined              Anticipated d/c date is: 2 days              Patient currently is not medically stable to d/c.        Consultants:   Cardiology  Procedures:   Echo  Antimicrobials:    Subjective: She has not had a bowel movement.  She was having ribs pain. I informed nurse, who was going to get her pain medications.    Objective: Vitals:   04/05/20 0338 04/05/20 0400 04/05/20 0724 04/05/20 1141  BP: (!) 119/52  136/70 (!) 128/55  Pulse: 62 64 67 64  Resp: 20 18 19 14   Temp: 98 F (36.7 C)  98.1 F (36.7 C) 98.1 F (36.7 C)  TempSrc: Oral  Oral Oral  SpO2:  95% 95% 95%  Weight:      Height:        Intake/Output Summary (Last 24 hours) at 04/05/2020 1356 Last data filed at 04/05/2020 1256 Gross per 24 hour  Intake 880 ml  Output 1800 ml  Net -920 ml   Filed Weights   04/03/20 0141  Weight: 89.8 kg    Examination:  General  exam:NAD Respiratory system: CTA Cardiovascular system: S 1, S 2 RRR. Gastrointestinal system: BS present, soft, nt Central nervous system: alert, following command Extremities: Symmetric power   Data Reviewed: I have personally reviewed following labs and imaging studies  CBC: Recent Labs  Lab 04/03/20 0526 04/04/20 1829 04/05/20 1028  WBC 9.1 10.3 7.3  NEUTROABS 7.5  --   --   HGB 13.5 13.2 13.3  HCT 40.8 41.0 40.7  MCV 90.9 92.3 93.3  PLT 150 185 466   Basic Metabolic Panel: Recent Labs  Lab 04/03/20 0526 04/04/20 1829  NA 133* 130*  K 3.4* 4.5  CL 98 99  CO2 27 20*  GLUCOSE 125* 112*  BUN 13 18  CREATININE 0.72 0.91  CALCIUM 9.0 8.4*  MG 2.0 1.9   GFR: Estimated Creatinine Clearance: 53 mL/min (by C-G formula based on SCr of  0.91 mg/dL). Liver Function Tests: Recent Labs  Lab 04/04/20 1829  AST 20  ALT 16  ALKPHOS 62  BILITOT 0.8  PROT 5.6*  ALBUMIN 3.0*   No results for input(s): LIPASE, AMYLASE in the last 168 hours. No results for input(s): AMMONIA in the last 168 hours. Coagulation Profile: No results for input(s): INR, PROTIME in the last 168 hours. Cardiac Enzymes: No results for input(s): CKTOTAL, CKMB, CKMBINDEX, TROPONINI in the last 168 hours. BNP (last 3 results) No results for input(s): PROBNP in the last 8760 hours. HbA1C: No results for input(s): HGBA1C in the last 72 hours. CBG: Recent Labs  Lab 04/05/20 0723 04/05/20 1140  GLUCAP 114* 127*   Lipid Profile: No results for input(s): CHOL, HDL, LDLCALC, TRIG, CHOLHDL, LDLDIRECT in the last 72 hours. Thyroid Function Tests: No results for input(s): TSH, T4TOTAL, FREET4, T3FREE, THYROIDAB in the last 72 hours. Anemia Panel: No results for input(s): VITAMINB12, FOLATE, FERRITIN, TIBC, IRON, RETICCTPCT in the last 72 hours. Sepsis Labs: No results for input(s): PROCALCITON, LATICACIDVEN in the last 168 hours.  Recent Results (from the past 240 hour(s))  Urine C&S     Status: Abnormal   Collection Time: 04/03/20  2:11 AM   Specimen: Urine, Random  Result Value Ref Range Status   Specimen Description   Final    URINE, RANDOM Performed at [facility] [address], [place], [place] [address]    Special Requests   Final    NONE Performed at [facility], [place] [address] [place],  [address]    Culture MULTIPLE SPECIES PRESENT, SUGGEST RECOLLECTION (A)  Final   Report Status 04/04/2020 FINAL  Final  Respiratory Panel by RT PCR (Flu A&B, Covid) - Nasopharyngeal Swab     Status: None   Collection Time: 04/03/20  6:55 AM   Specimen: Nasopharyngeal Swab  Result Value Ref Range Status   SARS Coronavirus 2 by RT PCR NEGATIVE NEGATIVE Final    Comment: (NOTE) SARS-CoV-2 target nucleic  acids are NOT DETECTED.  The SARS-CoV-2 RNA is generally detectable in upper respiratoy specimens during the acute phase of infection. The lowest concentration of SARS-CoV-2 viral copies this assay can detect is 131 copies/mL. A negative result does not preclude SARS-Cov-2 infection and should not be used as the sole basis for treatment or other patient management decisions. A negative result may occur with  improper specimen collection/handling, submission of specimen other than nasopharyngeal swab, presence of viral mutation(s) within the areas targeted by this assay, and inadequate number of viral copies (<131 copies/mL). A negative result must be combined with clinical  observations, patient history, and epidemiological information. The expected result is Negative.  Fact Sheet for Patients:  PinkCheek.be  Fact Sheet for Healthcare Providers:  GravelBags.it  This test is no t yet approved or cleared by the [place] FDA and  has been authorized for detection and/or diagnosis of SARS-CoV-2 by FDA under an Emergency Use Authorization (EUA). This EUA will remain  in effect (meaning this test can be used) for the duration of the COVID-19 declaration under Section 564(b)(1) of the Act, 21 U.S.C. section 360bbb-3(b)(1), unless the authorization is terminated or revoked sooner.     Influenza A by PCR NEGATIVE NEGATIVE Final   Influenza B by PCR NEGATIVE NEGATIVE Final    Comment: (NOTE) The Xpert Xpress SARS-CoV-2/FLU/RSV assay is intended as an aid in  the diagnosis of influenza from Nasopharyngeal swab specimens and  should not be used as a sole basis for treatment. Nasal washings and  aspirates are unacceptable for Xpert Xpress SARS-CoV-2/FLU/RSV  testing.  Fact Sheet for Patients: PinkCheek.be  Fact Sheet for Healthcare Providers: GravelBags.it  This test  is not yet approved or cleared by the [place] FDA and  has been authorized for detection and/or diagnosis of SARS-CoV-2 by  FDA under an Emergency Use Authorization (EUA). This EUA will remain  in effect (meaning this test can be used) for the duration of the  Covid-19 declaration under Section 564(b)(1) of the Act, 21  U.S.C. section 360bbb-3(b)(1), unless the authorization is  terminated or revoked. Performed at [facility], [place] [address] [place], [place] [address]   MRSA PCR Screening     Status: None   Collection Time: 04/03/20  9:30 PM   Specimen: Nasal Mucosa; Nasopharyngeal  Result Value Ref Range Status   MRSA by PCR NEGATIVE NEGATIVE Final    Comment:        The GeneXpert MRSA Assay (FDA approved for NASAL specimens only), is one component of a comprehensive MRSA colonization surveillance program. It is not intended to diagnose MRSA infection nor to guide or monitor treatment for MRSA infections. Performed at [facility], [place] [address] [place], [place] [address]          Radiology Studies: DG CHEST PORT 1 VIEW  Result Date: 04/04/2020 CLINICAL DATA:  Rib fractures. EXAM: PORTABLE CHEST 1 VIEW COMPARISON:  CT 04/03/2020.  Chest x-ray 03/21/2020. FINDINGS: Mediastinum hilar structures normal. Heart size stable. Persistent left base atelectasis/infiltrate small left pleural effusion. No pneumothorax. Multiple rib fractures best identified by prior CT. IMPRESSION: 1. Persistent left base atelectasis/infiltrate and small left pleural effusion. No interim change. No pneumothorax. 2. Multiple rib fractures best identified by prior CT. Electronically Signed   By: Marcello Moores  Register   On: 04/04/2020 05:55   ECHOCARDIOGRAM COMPLETE  Result Date: 04/04/2020    ECHOCARDIOGRAM REPORT   Patient Name:   Mary Rice Date of Exam: 04/04/2020 Medical Rec #:  [id number]     Height:       68.0 in Accession #:    [id number]    Weight:       198.0 lb Date  of Birth:  14-Oct-1932      BSA:          2.035 m Patient Age:    89 years      BP:           134/58 mmHg Patient Gender: F             HR:  69 bpm. Exam Location:  Inpatient Procedure: 2D Echo, Color Doppler, Cardiac Doppler and Intracardiac            Opacification Agent Indications:    Atrial Fibrillation 427.31 / I48.91  History:        Patient has prior history of Echocardiogram examinations, most                 recent 10/27/2015. COPD, Arrythmias:Atrial Fibrillation; Risk                 Factors:Hypertension, Diabetes and Dyslipidemia. Pulmonary                 embolism.  Sonographer:    Vickie Epley RDCS Referring Phys: [id number] [place]  1. Left ventricular ejection fraction, by estimation, is 65 to 70%. The left ventricle has normal function. The left ventricle has no regional wall motion abnormalities. Left ventricular diastolic function could not be evaluated.  2. Right ventricular systolic function is normal. The right ventricular size is normal. There is mildly elevated pulmonary artery systolic pressure. The estimated right ventricular systolic pressure is 37.8 mmHg.  3. The mitral valve is normal in structure. No evidence of mitral valve regurgitation. No evidence of mitral stenosis.  4. Tricuspid valve regurgitation is moderate.  5. The aortic valve is normal in structure. There is moderate calcification of the aortic valve. There is moderate thickening of the aortic valve. Aortic valve regurgitation is trivial. Mild to moderate aortic valve sclerosis/calcification is present, without any evidence of aortic stenosis.  6. The inferior vena cava is normal in size with greater than 50% respiratory variability, suggesting right atrial pressure of 3 mmHg. FINDINGS  Left Ventricle: Left ventricular ejection fraction, by estimation, is 65 to 70%. The left ventricle has normal function. The left ventricle has no regional wall motion abnormalities. Definity contrast agent was given IV to  delineate the left ventricular  endocardial borders. The left ventricular internal cavity size was normal in size. There is no left ventricular hypertrophy. Left ventricular diastolic function could not be evaluated due to atrial fibrillation. Left ventricular diastolic function could  not be evaluated. Right Ventricle: The right ventricular size is normal. No increase in right ventricular wall thickness. Right ventricular systolic function is normal. There is mildly elevated pulmonary artery systolic pressure. The tricuspid regurgitant velocity is 3.02  m/s, and with an assumed right atrial pressure of 3 mmHg, the estimated right ventricular systolic pressure is 58.8 mmHg. Left Atrium: Left atrial size was normal in size. Right Atrium: Right atrial size was normal in size. Pericardium: There is no evidence of pericardial effusion. Mitral Valve: The mitral valve is normal in structure. There is mild thickening of the mitral valve leaflet(s). Mild mitral annular calcification. No evidence of mitral valve regurgitation. No evidence of mitral valve stenosis. Tricuspid Valve: The tricuspid valve is normal in structure. Tricuspid valve regurgitation is moderate . No evidence of tricuspid stenosis. Aortic Valve: The aortic valve is normal in structure. There is moderate calcification of the aortic valve. There is moderate thickening of the aortic valve. Aortic valve regurgitation is trivial. Mild to moderate aortic valve sclerosis/calcification is present, without any evidence of aortic stenosis. Pulmonic Valve: The pulmonic valve was normal in structure. Pulmonic valve regurgitation is not visualized. No evidence of pulmonic stenosis. Aorta: The aortic root is normal in size and structure. Venous: The inferior vena cava is normal in size with greater than 50% respiratory variability, suggesting right atrial pressure of  3 mmHg. IAS/Shunts: No atrial level shunt detected by color flow Doppler.  LEFT VENTRICLE PLAX 2D  LVIDd:         4.70 cm LVIDs:         3.90 cm LV PW:         0.70 cm LV IVS:        0.70 cm LVOT diam:     2.10 cm LV SV:         72 LV SV Index:   35 LVOT Area:     3.46 cm  LV Volumes (MOD) LV vol d, MOD A4C: 69.9 ml LV vol s, MOD A4C: 27.5 ml LV SV MOD A4C:     69.9 ml RIGHT VENTRICLE RV S prime:     7.62 cm/s TAPSE (M-mode): 1.5 cm LEFT ATRIUM           Index       RIGHT ATRIUM           Index LA diam:      3.70 cm 1.82 cm/m  RA Area:     15.70 cm LA Vol (A2C): 41.7 ml 20.49 ml/m RA Volume:   38.60 ml  18.97 ml/m LA Vol (A4C): 36.8 ml 18.08 ml/m  AORTIC VALVE LVOT Vmax:   101.00 cm/s LVOT Vmean:  70.700 cm/s LVOT VTI:    0.208 m  AORTA Ao Root diam: 2.90 cm TRICUSPID VALVE TR Peak grad:   36.5 mmHg TR Vmax:        302.00 cm/s  SHUNTS Systemic VTI:  0.21 m Systemic Diam: 2.10 cm Ena Dawley MD Electronically signed by Ena Dawley MD Signature Date/Time: 04/04/2020/1:09:18 PM    Final         Scheduled Meds:  acetaminophen  650 mg Oral Q6H   apixaban  10 mg Oral BID   Followed by   Derrill Memo ON 04/12/2020] apixaban  5 mg Oral BID   [START ON 04/06/2020] atenolol  12.5 mg Oral Daily   buPROPion  150 mg Oral Daily   Chlorhexidine Gluconate Cloth  6 each Topical Daily   docusate sodium  100 mg Oral QHS   gabapentin  100 mg Oral TID   lidocaine  1 patch Transdermal Q24H   methocarbamol  500 mg Oral Q6H   pantoprazole  40 mg Oral Daily   polyethylene glycol  17 g Oral Daily   pravastatin  20 mg Oral QHS   sodium chloride flush  3 mL Intravenous Q12H   sodium chloride  1 g Oral BID WC   Continuous Infusions:    LOS: 2 days    Time spent: 35 minutes.     Elmarie Shiley, MD [facility]   If 7PM-7AM, please contact night-coverage [email or web address]  04/05/2020, 1:56 PM

## 2020-04-05 NOTE — Evaluation (Signed)
Occupational Therapy Evaluation Patient Details Name: Mary Rice MRN: [id number] DOB: [birth year] Today's Date: 04/05/2020    History of Present Illness Mary Rice is a 86 y.o. female with medical history significant of a fib, DVT/PE, HTN, dyslipidemia, and recent fall with vertebral compression fx and kypholasty 02/26/20.  Presents with left flank/chest pain after fall and found to have rib fx L 5-7, emboli RLL & L atrial appendage thrombus.   Clinical Impression   This 87 y/o female presents with the above. PTA pt reports typically mod independent with ADL and mobility using RW, (assist for shower transfers), lives with spouse; per family pt with hx of falls. Pt currently with limitations mostly due to pain at this time. Pt requiring up to [place] for bed mobility and sit<>stand at RW, once upright able to take few steps in room with minA throughout; requires up to maxA for LB ADL. Pt's daughter in law present and supportive throughout session, reports pt to have 24hr supervision/assist at time of discharge. Pt will benefit from continued acute OT services and currently recommend follow up [facility] services after discharge to maximize her safety and independence with ADL and mobility. Will follow.     Follow Up Recommendations  Home health OT;Supervision/Assistance - 24 hour    Equipment Recommendations  None recommended by OT (pt's DME needs are met)           Precautions / Restrictions Precautions Precautions: Fall Precaution Comments: reports L knee buckles at time Restrictions Weight Bearing Restrictions: No      Mobility Bed Mobility Overal bed mobility: Needs Assistance Bed Mobility: Supine to Sit     Supine to sit: Mod assist     General bed mobility comments: pt able to progress LEs towards EOB with assist to scoot hips and elevate trunk  Transfers Overall transfer level: Needs assistance Equipment used: Rolling walker (2 wheeled) Transfers: Sit to/from Stand Sit to  Stand: Mod assist         General transfer comment: assist to boosting to standing and to steady at RW, VCs for safe hand placement with transition to standing and with controlled descent to sitting     Balance Overall balance assessment: Needs assistance Sitting-balance support: Feet supported Sitting balance-Leahy Scale: Fair     Standing balance support: Bilateral upper extremity supported Standing balance-Leahy Scale: Poor Standing balance comment: reliant on UE support and assist                           ADL either performed or assessed with clinical judgement   ADL Overall ADL's : Needs assistance/impaired Eating/Feeding: Set up;Sitting   Grooming: Set up;Supervision/safety;Sitting   Upper Body Bathing: Minimal assistance;Sitting   Lower Body Bathing: Maximal assistance;Sit to/from stand;Sitting/lateral leans   Upper Body Dressing : Minimal assistance;Sitting   Lower Body Dressing: Maximal assistance;Sit to/from stand Lower Body Dressing Details (indicate cue type and reason): due to pain and pt unable to reach towards her feet at this time, daughter-in-law present and assisting to don her shoes Toilet Transfer: Minimal assistance;Stand-pivot;RW Toilet Transfer Details (indicate cue type and reason): simulated via transfer to [place] and Hygiene: Moderate assistance;Sit to/from stand;Sitting/lateral lean       Functional mobility during ADLs: Minimal assistance;Rolling walker (modA to stand) General ADL Comments: pt mostly with limitations due to pain at this time, pt with hx of falls  Pertinent Vitals/Pain Pain Assessment: Faces Faces Pain Scale: Hurts whole lot Pain Location: L ribcage with certain movements Pain Descriptors / Indicators: Discomfort;Grimacing;Aching Pain Intervention(s): Monitored during session;Repositioned;RN gave pain meds during session     Hand Dominance Right    Extremity/Trunk Assessment Upper Extremity Assessment Upper Extremity Assessment: LUE deficits/detail LUE Deficits / Details: limited ROM due to painful side LUE: Unable to fully assess due to pain LUE Coordination: decreased gross motor   Lower Extremity Assessment Lower Extremity Assessment: Defer to PT evaluation       Communication Communication Communication: No difficulties   Cognition Arousal/Alertness: Awake/alert Behavior During Therapy: WFL for tasks assessed/performed Overall Cognitive Status: Impaired/Different from baseline Area of Impairment: Problem solving;Following commands                       Following Commands: Follows one step commands consistently;Follows one step commands with increased time     Problem Solving: Slow processing;Requires verbal cues;Requires tactile cues     General Comments  VSS on RA    Exercises     Shoulder Instructions      Home Living Family/patient expects to be discharged to:: Private residence Living Arrangements: Spouse/significant other Available Help at Discharge: Family;Available 24 hours/day Type of Home: House Home Access: Ramped entrance     Home Layout: Laundry or work area in basement;Able to live on main level with bedroom/bathroom     Bathroom Shower/Tub: Hospital doctor Toilet: Handicapped height     Home Equipment: Environmental consultant - 2 wheels;Walker - 4 wheels;Bedside commode;Shower seat          Prior Functioning/Environment Level of Independence: Independent with assistive device(s)        Comments: using RW at home, family assists with getting into/out of shower, intermittently assists with some LB ADL         OT Problem List: Decreased strength;Decreased range of motion;Impaired balance (sitting and/or standing);Decreased activity tolerance;Decreased knowledge of use of DME or AE;Pain;Decreased knowledge of precautions;Decreased cognition      OT Treatment/Interventions:  Self-care/ADL training;Therapeutic exercise;Energy conservation;DME and/or AE instruction;Therapeutic activities;Cognitive remediation/compensation;Patient/family education;Balance training    OT Goals(Current goals can be found in the care plan section) Acute Rehab OT Goals Patient Stated Goal: to go home OT Goal Formulation: With patient Time For Goal Achievement: 04/19/20 Potential to Achieve Goals: Good  OT Frequency: Min 2X/week   Barriers to D/C:            Co-evaluation              AM-PAC OT "6 Clicks" Daily Activity     Outcome Measure Help from another person eating meals?: A Little Help from another person taking care of personal grooming?: A Little Help from another person toileting, which includes using toliet, bedpan, or urinal?: A Lot Help from another person bathing (including washing, rinsing, drying)?: A Lot Help from another person to put on and taking off regular upper body clothing?: A Little Help from another person to put on and taking off regular lower body clothing?: A Lot 6 Click Score: 15   End of Session Equipment Utilized During Treatment: Gait belt;Rolling walker Nurse Communication: Mobility status  Activity Tolerance: Patient tolerated treatment well Patient left: in chair;with call bell/phone within reach;with chair alarm set;with family/visitor present  OT Visit Diagnosis: Other abnormalities of gait and mobility (R26.89);Pain;History of falling (Z91.81) Pain - Right/Left: Left Pain - part of body:  (ribcage)  Time: 1440-1511 OT Time Calculation (min): 31 min Charges:  OT General Charges $OT Visit: 1 Visit OT Evaluation $OT Eval Moderate Complexity: 1 Mod OT Treatments $Self Care/Home Management : 8-22 mins  Lou Cal, OT Acute Rehabilitation Services Pager [phone] Office [phone]   Raymondo Band 04/05/2020, 4:51 PM

## 2020-04-05 NOTE — Progress Notes (Signed)
Received pt from [place] [place], female pt for continuity of care

## 2020-04-06 DIAGNOSIS — I2782 Chronic pulmonary embolism: Secondary | ICD-10-CM

## 2020-04-06 DIAGNOSIS — S2242XA Multiple fractures of ribs, left side, initial encounter for closed fracture: Principal | ICD-10-CM

## 2020-04-06 LAB — CBC
HCT: 42.7 % (ref 36.0–46.0)
Hemoglobin: 13.6 g/dL (ref 12.0–15.0)
MCH: 29.6 pg (ref 26.0–34.0)
MCHC: 31.9 g/dL (ref 30.0–36.0)
MCV: 93 fL (ref 80.0–100.0)
Platelets: 192 10*3/uL (ref 150–400)
RBC: 4.59 MIL/uL (ref 3.87–5.11)
RDW: 17.2 % — ABNORMAL HIGH (ref 11.5–15.5)
WBC: 6.8 10*3/uL (ref 4.0–10.5)
nRBC: 0 % (ref 0.0–0.2)

## 2020-04-06 LAB — GLUCOSE, CAPILLARY: Glucose-Capillary: 128 mg/dL — ABNORMAL HIGH (ref 70–99)

## 2020-04-06 MED ORDER — OXYCODONE HCL 5 MG PO TABS
5.0000 mg | ORAL_TABLET | ORAL | 0 refills | Status: DC | PRN
Start: 2020-04-06 — End: 2022-01-23

## 2020-04-06 MED ORDER — ACETAMINOPHEN 500 MG PO TABS: 1000.0000 mg | ORAL_TABLET | Freq: Three times a day (TID) | ORAL | Status: AC

## 2020-04-06 MED ORDER — ATENOLOL 25 MG PO TABS
12.5000 mg | ORAL_TABLET | Freq: Every day | ORAL | 3 refills | Status: DC
Start: 2020-04-07 — End: 2020-12-28

## 2020-04-06 MED ORDER — AMLODIPINE BESYLATE 2.5 MG PO TABS
2.5000 mg | ORAL_TABLET | Freq: Every day | ORAL | 3 refills | Status: DC
Start: 2020-04-06 — End: 2020-12-28

## 2020-04-06 MED ORDER — APIXABAN 5 MG PO TABS: ORAL_TABLET | ORAL | 0 refills | Status: DC

## 2020-04-06 MED ORDER — AMLODIPINE BESYLATE 2.5 MG PO TABS: 2.5000 mg | ORAL_TABLET | Freq: Every day | ORAL | Status: DC

## 2020-04-06 MED FILL — AMLODIPINE BESYLATE 2.5 MG: 2.5 | 30 days supply | Qty: 30 | Fill #0

## 2020-04-06 MED FILL — oxyCODONE HCL 5 MG TABS: 5 | 3 days supply | Qty: 16 | Fill #0

## 2020-04-06 MED FILL — ELIQUIS 5 MG TABLET: 5 | 30 days supply | Qty: 72 | Fill #0

## 2020-04-06 NOTE — Progress Notes (Signed)
Physical Therapy Treatment Patient Details Name: Mary Rice MRN: [id number] DOB: [birth year] Today's Date: 04/06/2020    History of Present Illness Mary Rice is a 86 y.o. female with medical history significant of a fib, DVT/PE, HTN, dyslipidemia, and recent fall with vertebral compression fx and kypholasty 02/26/20.  Presents with left flank/chest pain after fall and found to have rib fx L 5-7, emboli RLL & L atrial appendage thrombus.    PT Comments    Pt remains limited by pain in ribs but does demonstrate improved ambulation tolerance this session. Pt's daughter is present and assists in transfers and bed mobility during session. Pt will continue to benefit from acute PT services to improve transfer technique, strength, balance, and activity tolerance, in the hope of reducing falls risk. PT continues to recommend discharge home with HHPT. PT also recommends the pt receive a wheelchair to aide in community mobility. Pt should have assistance from family for all functional mobility as she remains a high falls risk.   Follow Up Recommendations  Home health PT;Supervision/Assistance - 24 hour     Equipment Recommendations  Wheelchair (measurements PT);Wheelchair cushion (measurements PT)    Recommendations for Other Services       Precautions / Restrictions Precautions Precautions: Fall Precaution Comments: reports L knee buckles at time Restrictions Weight Bearing Restrictions: No    Mobility  Bed Mobility Overal bed mobility: Needs Assistance Bed Mobility: Supine to Sit     Supine to sit: Mod assist;+2 for physical assistance;HOB elevated     General bed mobility comments: pt reports she is unable to roll 2/2 pain, mobilizes LEs to edge of bed and pivots hips prior to raising trunk into sitting with PT support  Transfers Overall transfer level: Needs assistance Equipment used: Rolling walker (2 wheeled) Transfers: Sit to/from Stand Sit to Stand: Min assist          General transfer comment: PT provides cues for hand placement and DME management to reduce falls risk. PT allows family to provide physical assistance during transfer to simulate home environment.  Ambulation/Gait Ambulation/Gait assistance: Min guard Gait Distance (Feet): 25 Feet (additional trial of 15') Assistive device: Rolling walker (2 wheeled) Gait Pattern/deviations: Step-to pattern;Decreased step length - right;Decreased step length - left Gait velocity: reduced Gait velocity interpretation: <1.8 ft/sec, indicate of risk for recurrent falls General Gait Details: pt with short step to gait with reduced step length bilaterally   Stairs             Wheelchair Mobility    Modified Rankin (Stroke Patients Only)       Balance Overall balance assessment: Needs assistance Sitting-balance support: Single extremity supported;Feet supported Sitting balance-Leahy Scale: Poor Sitting balance - Comments: reliant on UE support of bed   Standing balance support: Bilateral upper extremity supported Standing balance-Leahy Scale: Poor Standing balance comment: reliant on UE support and minG                            Cognition Arousal/Alertness: Awake/alert Behavior During Therapy: WFL for tasks assessed/performed Overall Cognitive Status: Within Functional Limits for tasks assessed                                        Exercises      General Comments General comments (skin integrity, edema, etc.): VSS on RA  Pertinent Vitals/Pain Pain Assessment: Faces Faces Pain Scale: Hurts whole lot Pain Location: ribs Pain Descriptors / Indicators: Aching Pain Intervention(s): Monitored during session    Home Living                      Prior Function            PT Goals (current goals can now be found in the care plan section) Acute Rehab PT Goals Patient Stated Goal: to go home Progress towards PT goals: Progressing toward  goals    Frequency    Min 3X/week      PT Plan Current plan remains appropriate    Co-evaluation              AM-PAC PT "6 Clicks" Mobility   Outcome Measure  Help needed turning from your back to your side while in a flat bed without using bedrails?: A Lot Help needed moving from lying on your back to sitting on the side of a flat bed without using bedrails?: A Lot Help needed moving to and from a bed to a chair (including a wheelchair)?: A Little Help needed standing up from a chair using your arms (e.g., wheelchair or bedside chair)?: A Little Help needed to walk in hospital room?: A Little Help needed climbing 3-5 steps with a railing? : A Lot 6 Click Score: 15    End of Session   Activity Tolerance: Patient limited by pain Patient left: in chair;with call bell/phone within reach;with chair alarm set;with family/visitor present Nurse Communication: Mobility status PT Visit Diagnosis: Other abnormalities of gait and mobility (R26.89);Repeated falls (R29.6);Pain;Muscle weakness (generalized) (M62.81) Pain - Right/Left: Left Pain - part of body:  (ribs)     Time: 4356-8616 PT Time Calculation (min) (ACUTE ONLY): 24 min  Charges:  $Gait Training: 8-22 mins $Therapeutic Activity: 8-22 mins                     Zenaida Niece, PT, DPT Acute Rehabilitation Pager: [phone]    Zenaida Niece 04/06/2020, 2:21 PM

## 2020-04-06 NOTE — Discharge Summary (Signed)
Physician Discharge Summary  STARLA DELLER KVQ:[id number] DOB: 1933/03/29 DOA: 04/03/2020  PCP: Rusty Aus, MD  Admit date: 04/03/2020 Discharge date: 04/06/2020  Admitted From: Home Disposition: Home with home health  Recommendations for Outpatient Follow-up:  1. Follow up with PCP in 1-2 weeks 2. Follow-up with cardiology in 4 to 6 weeks 3. Dr. Sabra Heck: Please obtain hemoglobin on new blood thinner and BMP to follow-up potassium and sodium 4. Dr. Sabra Heck: Please reviewed CT imaging of a 4 cm presacral mass and perform interval imaging to confirm stability     Home Health: PT/OT due to mobility limited by pain with ambulation Equipment/Devices: Wheelchair  Discharge Condition: Fair CODE STATUS: Partial code Diet recommendation: Cardiac  Brief/Interim Summary: Mrs. Riederer is an 86 year old female with medical history significant for atrial fibrillation presented with chest pain after a fall.  Several days prior to admission, she was leaning over the edge of the bed when she fell on the radiator and hit her left side.  She had progressive worsening pain in the left ribs for several days and so she came to the ER.  Imaging in the ER showed fractures of the left fifth, sixth, and seventh ribs as well as new lung emboli, and left anterior atrial appendage thrombus.       PRINCIPAL HOSPITAL DIAGNOSIS: Multiple rib fractures complicated by pulmonary emboli and left atrial appendage thrombus    Discharge Diagnoses:   Multiple rib fracture Acute left 5-7th factors Patient was admitted, pain was controlled with acetaminophen and oxycodone, she was given incentive spirometry, did well, and was stable for discharge.  Acute pulmonary embolism History of recurrent unprovoked VTE Noted incidentally.  This occurred in the context of a trial off anticoagulation due to falls.  Here, started on heparin, transitioned to Eliquis.  Left atrial appendage thrombus Cardiology were  consulted.  Recommended nothing other than anticoagulation.  Atrial fibrillation, permanent CHA2DS2-VASc 6, corresponding to a 9.7% stroke risk per year.  Presacral soft tissue mass Fatty density noted incidentally on CT. -Recommend interval imaging to confirm stability  Hypertension  Hyponatremia Hypokalemia Resolved  Anxiety Continue Wellbutrin    Discharge Instructions  Discharge Instructions    Diet - low sodium heart healthy   Complete by: As directed    Discharge instructions   Complete by: As directed    From Dr. Loleta Books: For pain:  Take acetaminophen/Tylenol 1000 mg three times daily on schedule for 1 week (after that, reduce to use as needed) Take oxycodone 5 mg up to every 4 hours as needed for more severe pain Probably this will be too much, and you should aim to use oxycodone 2-3 times per day only over the next 2-3 days, reducing as able See Dr. Sabra Heck as soon as you can See Dr. Rockey Situ your heart specialist as soon as you can  REDUCE your atenolol to 12.5 mg daily (because your heart rate was low here) Had the blood pressure medicine amlodipine 2.5 mg once daily to lower blood pressure   Resume taking Eliquis Take Eliquis at a "loading dose" for a week then reduce to 5 mg (1 tab daily) Take apixaban/Eliquis 10 mg (2 tabs) twice daily until October 5 On October 6, reduce to 5 mg (1 tab) twice daily  Stop taking aspirin   Increase activity slowly   Complete by: As directed      Allergies as of 04/06/2020      Reactions   Biaxin [clarithromycin] Other (See Comments)   Reaction unknown  Maxzide [hydrochlorothiazide W-triamterene] Other (See Comments)   Reaction unknown   Penicillins Rash   Sulfa Antibiotics Rash      Medication List    STOP taking these medications   acetaminophen 650 MG CR tablet Commonly known as: TYLENOL Replaced by: acetaminophen 500 MG tablet   aspirin EC 325 MG tablet   mirabegron ER 25 MG Tb24 tablet Commonly known  as: MYRBETRIQ     TAKE these medications   acetaminophen 500 MG tablet Commonly known as: TYLENOL Take 2 tablets (1,000 mg total) by mouth in the morning, at noon, and at bedtime. Replaces: acetaminophen 650 MG CR tablet   amLODipine 2.5 MG tablet Commonly known as: NORVASC Take 1 tablet (2.5 mg total) by mouth daily.   apixaban 5 MG Tabs tablet Commonly known as: ELIQUIS Take 10 mg (2 tabs) twice daily for 7 days then take 5 mg (1 tab) twice daily   atenolol 25 MG tablet Commonly known as: TENORMIN Take 0.5 tablets (12.5 mg total) by mouth daily. Start taking on: April 07, 2020 What changed: how much to take   buPROPion 150 MG 24 hr tablet Commonly known as: WELLBUTRIN XL Take 150 mg by mouth at bedtime. Notes to patient: LAST DOSE: 9/29 0900   CALCIUM + D + K PO Take 1 tablet by mouth 2 (two) times daily.   calcium carbonate 500 MG chewable tablet Commonly known as: TUMS - dosed in mg elemental calcium Chew 1 tablet by mouth daily as needed for indigestion or heartburn.   Cholecalciferol 50 MCG (2000 UT) Tabs Take 2,000 Units by mouth daily.   docusate sodium 100 MG capsule Commonly known as: COLACE Take 300 mg by mouth at bedtime.   multivitamin with minerals Tabs tablet Take 1 tablet by mouth daily.   oxyCODONE 5 MG immediate release tablet Commonly known as: Oxy IR/ROXICODONE Take 1 tablet (5 mg total) by mouth every 4 (four) hours as needed for moderate pain, severe pain or breakthrough pain.   pantoprazole 40 MG tablet Commonly known as: PROTONIX Take 40 mg by mouth at bedtime.   polyethylene glycol 17 g packet Commonly known as: MIRALAX / GLYCOLAX Take 17 g by mouth daily.   pravastatin 20 MG tablet Commonly known as: PRAVACHOL Take 20 mg by mouth at bedtime.   sodium chloride 1 g tablet Take 1 g by mouth 2 (two) times daily with a meal.   Vitamin C 250 MG Chew Chew 750 mg by mouth daily.            Durable Medical Equipment  (From  admission, onward)         Start     Ordered   04/06/20 1114  DME standard manual wheelchair with seat cushion  Once       Comments: Patient suffers from multiple rib fractures which impairs their ability to perform daily activities like bathing, dressing, grooming, and toileting in the home.  A walker will not resolve issue with performing activities of daily living. A wheelchair will allow patient to safely perform daily activities. Patient can safely propel the wheelchair in the home or has a caregiver who can provide assistance. Length of need 6 months . Accessories: elevating leg rests (ELRs), wheel locks, extensions and anti-tippers.   04/06/20 1113          Follow-up Information    [facility] Follow [facility].   Specialty: Home Health Services Why: HHPT/OT services to resume  Allergies  Allergen Reactions  . Biaxin [Clarithromycin] Other (See Comments)    Reaction unknown  . Maxzide [Hydrochlorothiazide W-Triamterene] Other (See Comments)    Reaction unknown  . Penicillins Rash  . Sulfa Antibiotics Rash    Consultations:  Cardiology  Trauma surgery   Procedures/Studies: DG Chest 1 View  Result Date: 03/13/2020 CLINICAL DATA:  Altered mental status EXAM: CHEST  1 VIEW COMPARISON:  08/21/2016 FINDINGS: There is focal pulmonary infiltrate noted within the right upper lobe, likely infectious in the appropriate clinical setting. No pneumothorax or pleural effusion. Cardiac size within normal limits. Pulmonary vascularity is normal. No acute bone abnormality. IMPRESSION: Focal pulmonary infiltrate in the right upper lobe, likely infectious in the appropriate clinical setting. Electronically Signed   By: Fidela Salisbury MD   On: 03/13/2020 21:10   DG Chest 2 View  Result Date: 03/21/2020 CLINICAL DATA:  Sepsis, weakness, headache, fever, cellulitis, nausea, pneumonia EXAM: CHEST - 2 VIEW COMPARISON:  03/14/2020 FINDINGS: Prior right upper lobe  opacity is improved/resolved. No frank interstitial edema. Small bilateral pleural effusions, left greater than right. No pneumothorax. The heart is normal in size. IMPRESSION: Prior right upper lobe opacity is improved/resolved. Small bilateral pleural effusions, left greater than right. Electronically Signed   By: Julian Hy M.D.   On: 03/21/2020 09:45   DG Ribs Unilateral W/Chest Left  Result Date: 04/03/2020 CLINICAL DATA:  Initial evaluation for acute left-sided thoracic and back pain status post recent fall. EXAM: LEFT RIBS AND CHEST - 3+ VIEW COMPARISON:  Prior radiograph from 03/21/2020. FINDINGS: Mild cardiomegaly, stable. Mediastinal silhouette within normal limits. Aortic atherosclerosis. Lungs are hypoinflated. Small left pleural effusion. Associated patchy bibasilar opacities favored to reflect atelectasis. No visible pneumothorax. Dedicated views of the left ribs were performed. There are acute mildly displaced fractures of the left fifth, 6, seventh, and likely eighth ribs. Thoracolumbar scoliosis with sequelae of prior thoracic augmentation noted. IMPRESSION: 1. Acute mildly displaced fractures of the left fifth, sixth, seventh, and likely eighth ribs. 2. Small left pleural effusion with associated bibasilar atelectasis. 3.  Aortic Atherosclerosis (ICD10-I70.0). Electronically Signed   By: Jeannine Boga M.D.   On: 04/03/2020 04:39   DG Abdomen 1 View  Result Date: 03/13/2020 CLINICAL DATA:  Ileus EXAM: ABDOMEN - 1 VIEW COMPARISON:  None. FINDINGS: Normal abdominal gas pattern. Moderate stool within the rectal vault. No gross free intraperitoneal gas. Cholecystectomy clips noted within the right upper quadrant. T11 vertebroplasty has been performed. Moderate lumbar levoscoliosis. No acute bone abnormality. IMPRESSION: Moderate stool within the rectal vault. No evidence of bowel obstruction or free intraperitoneal gas. Electronically Signed   By: Fidela Salisbury MD   On: 03/13/2020  21:12   CT Head Wo Contrast  Result Date: 04/03/2020 CLINICAL DATA:  Head trauma. EXAM: CT HEAD WITHOUT CONTRAST TECHNIQUE: Contiguous axial images were obtained from the base of the skull through the vertex without intravenous contrast. COMPARISON:  03/21/2020 FINDINGS: Brain: No evidence of acute large vascular territory infarction, hemorrhage, hydrocephalus, extra-axial collection or mass lesion/mass effect. Similar patchy white matter hypoattenuation, compatible with the sequela of chronic microvascular ischemic disease. Similar mild diffuse cerebral atrophy with ex vacuo ventricular dilation. Vascular: Calcific atherosclerosis. Skull: Normal. Negative for fracture or focal lesion. Sinuses/Orbits: No acute finding. Other: No mastoid effusions. IMPRESSION: No evidence of acute intracranial abnormality. Electronically Signed   By: Margaretha Sheffield MD   On: 04/03/2020 08:33   CT Head Wo Contrast  Result Date: 03/21/2020 CLINICAL DATA:  Altered mental  status, weakness EXAM: CT HEAD WITHOUT CONTRAST TECHNIQUE: Contiguous axial images were obtained from the base of the skull through the vertex without intravenous contrast. COMPARISON:  None. FINDINGS: Brain: No evidence of acute infarction, hemorrhage, extra-axial collection, ventriculomegaly, or mass effect. Generalized cerebral atrophy. Periventricular white matter low attenuation likely secondary to microangiopathy. Vascular: Cerebrovascular atherosclerotic calcifications are noted. Skull: Negative for fracture or focal lesion. Sinuses/Orbits: Visualized portions of the orbits are unremarkable. Visualized portions of the paranasal sinuses are unremarkable. Visualized portions of the mastoid air cells are unremarkable. Other: None. IMPRESSION: 1. No acute intracranial pathology. 2. Chronic microvascular disease and cerebral atrophy. Electronically Signed   By: Kathreen Devoid   On: 03/21/2020 11:32   CT Head Wo Contrast  Result Date: 03/13/2020 CLINICAL  DATA:  Altered mental status EXAM: CT HEAD WITHOUT CONTRAST TECHNIQUE: Contiguous axial images were obtained from the base of the skull through the vertex without intravenous contrast. COMPARISON:  None. FINDINGS: Brain: There is atrophy and chronic small vessel disease changes. No acute intracranial abnormality. Specifically, no hemorrhage, hydrocephalus, mass lesion, acute infarction, or significant intracranial injury. Vascular: No hyperdense vessel or unexpected calcification. Skull: No acute calvarial abnormality. Sinuses/Orbits: Visualized paranasal sinuses and mastoids clear. Orbital soft tissues unremarkable. Other: None IMPRESSION: Atrophy, chronic microvascular disease. No acute intracranial abnormality. Electronically Signed   By: Rolm Baptise M.D.   On: 03/13/2020 21:14   CT Chest W Contrast  Result Date: 04/03/2020 CLINICAL DATA:  Abdominal trauma.  Fall with back pain EXAM: CT CHEST, ABDOMEN, AND PELVIS WITH CONTRAST TECHNIQUE: Multidetector CT imaging of the chest, abdomen and pelvis was performed following the standard protocol during bolus administration of intravenous contrast. CONTRAST:  150mL OMNIPAQUE IOHEXOL 300 MG/ML  SOLN COMPARISON:  03/14/2020 FINDINGS: CT CHEST FINDINGS Cardiovascular: Prominent by biatrial size. Low-density filling defect is seen at the left atrial appendage measuring 2.6 cm. Newly seen right lower lobe emboli involving the lobar to segmental vessels. No pericardial effusion. Mediastinum/Nodes: No hematoma or pneumomediastinum. Lungs/Pleura: Atelectasis at the lung bases. Trace left pleural effusion which is low-density appearing Musculoskeletal: Acute appearing lateral left fifth, sixth, seventh rib fractures. Remote anterior left fourth, fifth, and sixth rib fractures with callus. Remote anterior right rib fractures. CT ABDOMEN PELVIS FINDINGS Hepatobiliary: No hepatic injury or perihepatic hematoma. Gallbladder is absent Pancreas: Prominent fatty atrophy. Single  calcification at the head which appears separate from the course of the distal CBD Spleen: No splenic injury or perisplenic hematoma. Adrenals/Urinary Tract: No adrenal hemorrhage or renal injury identified. Bladder is collapsed around a Foley catheter Stomach/Bowel: No evidence of injury. Distal colonic diverticulosis. Vascular/Lymphatic: Atheromatous calcifications. No acute vascular finding. Reproductive: Hysterectomy Other: Mixed density presacral mass which measures 4 cm in diameter. No visible involvement of the sacrum or adjacent nerve roots. There are areas of fatty density within the mass, suggesting myelolipoma - which occur in this area. Musculoskeletal: Scoliosis and diffuse degenerative disease. Prior T11 compression fracture and cement augmentation. Critical Value/emergent results were called by telephone at the time of interpretation on 04/03/2020 at 8:43 am to Dr Gustavus Messing , who verbally acknowledged these results. IMPRESSION: 1. Large thrombus in the left atrial appendage. 2. Lobar and segmental right lower lobe pulmonary emboli new from CTA 3 weeks ago. 3. Acute left fifth, sixth, and seventh rib fractures. Remote rib fractures on both sides. There is atelectasis and trace left pleural fluid. No pneumothorax. 4. 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 Recommend imaging follow-up to  confirm stability. Electronically Signed   By: Monte Fantasia M.D.   On: 04/03/2020 08:47   CT Angio Chest PE W and/or Wo Contrast  Result Date: 03/14/2020 CLINICAL DATA:  Altered mental status. Vomiting. Distended stomach. High probability for pulmonary embolus. EXAM: CT ANGIOGRAPHY CHEST CT ABDOMEN AND PELVIS WITH CONTRAST TECHNIQUE: Multidetector CT imaging of the chest was performed using the standard protocol during bolus administration of intravenous contrast. Multiplanar CT image reconstructions and MIPs were obtained to evaluate the vascular anatomy. Multidetector CT  imaging of the abdomen and pelvis was performed using the standard protocol during bolus administration of intravenous contrast. CONTRAST:  140mL OMNIPAQUE IOHEXOL 350 MG/ML SOLN COMPARISON:  None. FINDINGS: CTA CHEST FINDINGS Cardiovascular: Good opacification of the central and segmental pulmonary arteries. No focal filling defects. No evidence of significant pulmonary embolus. Normal heart size. No pericardial effusions. Coronary artery and aortic calcification. No aortic aneurysm. Mediastinum/Nodes: Small esophageal hiatal hernia. Esophagus is decompressed. No significant lymphadenopathy. Thyroid gland is unremarkable. Lungs/Pleura: Small bilateral pleural effusions with basilar atelectasis. Airspace disease in the right upper lung and right middle lung could represent pneumonia or asymmetrical edema. Musculoskeletal: Old vertebral compression deformity post kyphoplasty. Old rib fractures. No acute bony changes. Review of the MIP images confirms the above findings. CT ABDOMEN and PELVIS FINDINGS Hepatobiliary: Surgical absence of the gallbladder. No bile duct dilatation. Liver is unremarkable. Pancreas: Fatty infiltration of the pancreas. No acute changes identified. Spleen: Normal in size without focal abnormality. Adrenals/Urinary Tract: Adrenal glands are unremarkable. Kidneys are normal, without renal calculi, focal lesion, or hydronephrosis. Bladder is unremarkable. Stomach/Bowel: Stomach, small bowel, and colon are mostly decompressed. Colonic diverticulosis without evidence of diverticulitis. Appendix is not identified. Vascular/Lymphatic: Aortic atherosclerosis. No enlarged abdominal or pelvic lymph nodes. Reproductive: Uterus is surgically absent. There is a heterogeneous circumscribed presacral mass measuring 3.8 x 3.9 cm in diameter. The mass is partially fat density. This could represent an ovarian lesion in an abnormal location, an old hematoma or area of fat necrosis, or a mass associated with the  sacrum. Other: No free air or free fluid in the abdomen. Musculoskeletal: Lumbar scoliosis convex towards the left. Degenerative changes in the spine and hips. No destructive bone lesions. Review of the MIP images confirms the above findings. IMPRESSION: 1. No evidence of significant pulmonary embolus. 2. Small bilateral pleural effusions with basilar atelectasis. 3. Airspace disease in the right upper lung and right middle lung could represent pneumonia or asymmetrical edema. 4. Small esophageal hiatal hernia. 5. Colonic diverticulosis without evidence of diverticulitis. 6. 3.8 cm heterogeneous circumscribed presacral mass with fat density. This could represent an ovarian lesion, old hematoma or area of fat necrosis, or a mass associated with the sacrum. Aortic atherosclerosis. Aortic Atherosclerosis (ICD10-I70.0). Electronically Signed   By: Lucienne Capers M.D.   On: 03/14/2020 03:29   MR BRAIN WO CONTRAST  Result Date: 03/21/2020 CLINICAL DATA:  86 year old female with altered mental status, weakness. Left side weakness. Confusion. EXAM: MRI HEAD WITHOUT CONTRAST TECHNIQUE: Multiplanar, multiecho pulse sequences of the brain and surrounding structures were obtained without intravenous contrast. COMPARISON:  Head CT earlier today, and 03/13/2020. FINDINGS: Brain: No restricted diffusion to suggest acute infarction. No midline shift, mass effect, evidence of mass lesion, ventriculomegaly, extra-axial collection or acute intracranial hemorrhage. Cervicomedullary junction and pituitary are within normal limits. Patchy and confluent bilateral cerebral white matter T2 and FLAIR hyperintensity, relatively sparing the deep white matter capsules. No superimposed cortical encephalomalacia or definite chronic cerebral blood products. The  deep gray matter nuclei, brainstem and cerebellum are within normal limits for age. Vascular: Major intracranial vascular flow voids are preserved. Skull and upper cervical spine:  Mild for age visible cervical spine degeneration. Visualized bone marrow signal is within normal limits. Sinuses/Orbits: Postoperative changes to both globes, otherwise negative orbits. Paranasal sinuses and mastoids are stable and well pneumatized. Other: Visible internal auditory structures appear normal. Scalp and face appear negative. IMPRESSION: 1. No acute intracranial abnormality. 2. Moderately advanced nonspecific cerebral white matter signal changes, most commonly due to chronic small vessel disease. Electronically Signed   By: Genevie Ann M.D.   On: 03/21/2020 19:07   CT ABDOMEN PELVIS W CONTRAST  Result Date: 04/03/2020 CLINICAL DATA:  Abdominal trauma.  Fall with back pain EXAM: CT CHEST, ABDOMEN, AND PELVIS WITH CONTRAST TECHNIQUE: Multidetector CT imaging of the chest, abdomen and pelvis was performed following the standard protocol during bolus administration of intravenous contrast. CONTRAST:  129mL OMNIPAQUE IOHEXOL 300 MG/ML  SOLN COMPARISON:  03/14/2020 FINDINGS: CT CHEST FINDINGS Cardiovascular: Prominent by biatrial size. Low-density filling defect is seen at the left atrial appendage measuring 2.6 cm. Newly seen right lower lobe emboli involving the lobar to segmental vessels. No pericardial effusion. Mediastinum/Nodes: No hematoma or pneumomediastinum. Lungs/Pleura: Atelectasis at the lung bases. Trace left pleural effusion which is low-density appearing Musculoskeletal: Acute appearing lateral left fifth, sixth, seventh rib fractures. Remote anterior left fourth, fifth, and sixth rib fractures with callus. Remote anterior right rib fractures. CT ABDOMEN PELVIS FINDINGS Hepatobiliary: No hepatic injury or perihepatic hematoma. Gallbladder is absent Pancreas: Prominent fatty atrophy. Single calcification at the head which appears separate from the course of the distal CBD Spleen: No splenic injury or perisplenic hematoma. Adrenals/Urinary Tract: No adrenal hemorrhage or renal injury identified.  Bladder is collapsed around a Foley catheter Stomach/Bowel: No evidence of injury. Distal colonic diverticulosis. Vascular/Lymphatic: Atheromatous calcifications. No acute vascular finding. Reproductive: Hysterectomy Other: Mixed density presacral mass which measures 4 cm in diameter. No visible involvement of the sacrum or adjacent nerve roots. There are areas of fatty density within the mass, suggesting myelolipoma - which occur in this area. Musculoskeletal: Scoliosis and diffuse degenerative disease. Prior T11 compression fracture and cement augmentation. Critical Value/emergent results were called by telephone at the time of interpretation on 04/03/2020 at 8:43 am to Dr Gustavus Messing , who verbally acknowledged these results. IMPRESSION: 1. Large thrombus in the left atrial appendage. 2. Lobar and segmental right lower lobe pulmonary emboli new from CTA 3 weeks ago. 3. Acute left fifth, sixth, and seventh rib fractures. Remote rib fractures on both sides. There is atelectasis and trace left pleural fluid. No pneumothorax. 4. 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 Recommend imaging follow-up to confirm stability. Electronically Signed   By: Monte Fantasia M.D.   On: 04/03/2020 08:47   CT ABDOMEN PELVIS W CONTRAST  Result Date: 03/14/2020 CLINICAL DATA:  Altered mental status. Vomiting. Distended stomach. High probability for pulmonary embolus. EXAM: CT ANGIOGRAPHY CHEST CT ABDOMEN AND PELVIS WITH CONTRAST TECHNIQUE: Multidetector CT imaging of the chest was performed using the standard protocol during bolus administration of intravenous contrast. Multiplanar CT image reconstructions and MIPs were obtained to evaluate the vascular anatomy. Multidetector CT imaging of the abdomen and pelvis was performed using the standard protocol during bolus administration of intravenous contrast. CONTRAST:  125mL OMNIPAQUE IOHEXOL 350 MG/ML SOLN COMPARISON:  None. FINDINGS: CTA CHEST  FINDINGS Cardiovascular: Good opacification of the central and segmental pulmonary  arteries. No focal filling defects. No evidence of significant pulmonary embolus. Normal heart size. No pericardial effusions. Coronary artery and aortic calcification. No aortic aneurysm. Mediastinum/Nodes: Small esophageal hiatal hernia. Esophagus is decompressed. No significant lymphadenopathy. Thyroid gland is unremarkable. Lungs/Pleura: Small bilateral pleural effusions with basilar atelectasis. Airspace disease in the right upper lung and right middle lung could represent pneumonia or asymmetrical edema. Musculoskeletal: Old vertebral compression deformity post kyphoplasty. Old rib fractures. No acute bony changes. Review of the MIP images confirms the above findings. CT ABDOMEN and PELVIS FINDINGS Hepatobiliary: Surgical absence of the gallbladder. No bile duct dilatation. Liver is unremarkable. Pancreas: Fatty infiltration of the pancreas. No acute changes identified. Spleen: Normal in size without focal abnormality. Adrenals/Urinary Tract: Adrenal glands are unremarkable. Kidneys are normal, without renal calculi, focal lesion, or hydronephrosis. Bladder is unremarkable. Stomach/Bowel: Stomach, small bowel, and colon are mostly decompressed. Colonic diverticulosis without evidence of diverticulitis. Appendix is not identified. Vascular/Lymphatic: Aortic atherosclerosis. No enlarged abdominal or pelvic lymph nodes. Reproductive: Uterus is surgically absent. There is a heterogeneous circumscribed presacral mass measuring 3.8 x 3.9 cm in diameter. The mass is partially fat density. This could represent an ovarian lesion in an abnormal location, an old hematoma or area of fat necrosis, or a mass associated with the sacrum. Other: No free air or free fluid in the abdomen. Musculoskeletal: Lumbar scoliosis convex towards the left. Degenerative changes in the spine and hips. No destructive bone lesions. Review of the MIP images  confirms the above findings. IMPRESSION: 1. No evidence of significant pulmonary embolus. 2. Small bilateral pleural effusions with basilar atelectasis. 3. Airspace disease in the right upper lung and right middle lung could represent pneumonia or asymmetrical edema. 4. Small esophageal hiatal hernia. 5. Colonic diverticulosis without evidence of diverticulitis. 6. 3.8 cm heterogeneous circumscribed presacral mass with fat density. This could represent an ovarian lesion, old hematoma or area of fat necrosis, or a mass associated with the sacrum. Aortic atherosclerosis. Aortic Atherosclerosis (ICD10-I70.0). Electronically Signed   By: Lucienne Capers M.D.   On: 03/14/2020 03:29   DG CHEST PORT 1 VIEW  Result Date: 04/04/2020 CLINICAL DATA:  Rib fractures. EXAM: PORTABLE CHEST 1 VIEW COMPARISON:  CT 04/03/2020.  Chest x-ray 03/21/2020. FINDINGS: Mediastinum hilar structures normal. Heart size stable. Persistent left base atelectasis/infiltrate small left pleural effusion. No pneumothorax. Multiple rib fractures best identified by prior CT. IMPRESSION: 1. Persistent left base atelectasis/infiltrate and small left pleural effusion. No interim change. No pneumothorax. 2. Multiple rib fractures best identified by prior CT. Electronically Signed   By: Marcello Moores  Register   On: 04/04/2020 05:55   ECHOCARDIOGRAM COMPLETE  Result Date: 04/04/2020    ECHOCARDIOGRAM REPORT   Patient Name:   ZORI BENBROOK Date of Exam: 04/04/2020 Medical Rec #:  [id number]     Height:       68.0 in Accession #:    [id number]    Weight:       198.0 lb Date of Birth:  [birth year]      BSA:          2.035 m Patient Age:    86 years      BP:           134/58 mmHg Patient Gender: F             HR:           69 bpm. Exam Location:  Inpatient Procedure: 2D Echo, Color Doppler, Cardiac Doppler and Intracardiac  Opacification Agent Indications:    Atrial Fibrillation 427.31 / I48.91  History:        Patient has prior history of Echocardiogram  examinations, most                 recent 10/27/2015. COPD, Arrythmias:Atrial Fibrillation; Risk                 Factors:Hypertension, Diabetes and Dyslipidemia. Pulmonary                 embolism.  Sonographer:    Vickie Epley RDCS Referring Phys: [id number] [place]  1. Left ventricular ejection fraction, by estimation, is 65 to 70%. The left ventricle has normal function. The left ventricle has no regional wall motion abnormalities. Left ventricular diastolic function could not be evaluated.  2. Right ventricular systolic function is normal. The right ventricular size is normal. There is mildly elevated pulmonary artery systolic pressure. The estimated right ventricular systolic pressure is 86.7 mmHg.  3. The mitral valve is normal in structure. No evidence of mitral valve regurgitation. No evidence of mitral stenosis.  4. Tricuspid valve regurgitation is moderate.  5. The aortic valve is normal in structure. There is moderate calcification of the aortic valve. There is moderate thickening of the aortic valve. Aortic valve regurgitation is trivial. Mild to moderate aortic valve sclerosis/calcification is present, without any evidence of aortic stenosis.  6. The inferior vena cava is normal in size with greater than 50% respiratory variability, suggesting right atrial pressure of 3 mmHg. FINDINGS  Left Ventricle: Left ventricular ejection fraction, by estimation, is 65 to 70%. The left ventricle has normal function. The left ventricle has no regional wall motion abnormalities. Definity contrast agent was given IV to delineate the left ventricular  endocardial borders. The left ventricular internal cavity size was normal in size. There is no left ventricular hypertrophy. Left ventricular diastolic function could not be evaluated due to atrial fibrillation. Left ventricular diastolic function could  not be evaluated. Right Ventricle: The right ventricular size is normal. No increase in right ventricular  wall thickness. Right ventricular systolic function is normal. There is mildly elevated pulmonary artery systolic pressure. The tricuspid regurgitant velocity is 3.02  m/s, and with an assumed right atrial pressure of 3 mmHg, the estimated right ventricular systolic pressure is 61.9 mmHg. Left Atrium: Left atrial size was normal in size. Right Atrium: Right atrial size was normal in size. Pericardium: There is no evidence of pericardial effusion. Mitral Valve: The mitral valve is normal in structure. There is mild thickening of the mitral valve leaflet(s). Mild mitral annular calcification. No evidence of mitral valve regurgitation. No evidence of mitral valve stenosis. Tricuspid Valve: The tricuspid valve is normal in structure. Tricuspid valve regurgitation is moderate . No evidence of tricuspid stenosis. Aortic Valve: The aortic valve is normal in structure. There is moderate calcification of the aortic valve. There is moderate thickening of the aortic valve. Aortic valve regurgitation is trivial. Mild to moderate aortic valve sclerosis/calcification is present, without any evidence of aortic stenosis. Pulmonic Valve: The pulmonic valve was normal in structure. Pulmonic valve regurgitation is not visualized. No evidence of pulmonic stenosis. Aorta: The aortic root is normal in size and structure. Venous: The inferior vena cava is normal in size with greater than 50% respiratory variability, suggesting right atrial pressure of 3 mmHg. IAS/Shunts: No atrial level shunt detected by color flow Doppler.  LEFT VENTRICLE PLAX 2D LVIDd:  4.70 cm LVIDs:         3.90 cm LV PW:         0.70 cm LV IVS:        0.70 cm LVOT diam:     2.10 cm LV SV:         72 LV SV Index:   35 LVOT Area:     3.46 cm  LV Volumes (MOD) LV vol d, MOD A4C: 69.9 ml LV vol s, MOD A4C: 27.5 ml LV SV MOD A4C:     69.9 ml RIGHT VENTRICLE RV S prime:     7.62 cm/s TAPSE (M-mode): 1.5 cm LEFT ATRIUM           Index       RIGHT ATRIUM            Index LA diam:      3.70 cm 1.82 cm/m  RA Area:     15.70 cm LA Vol (A2C): 41.7 ml 20.49 ml/m RA Volume:   38.60 ml  18.97 ml/m LA Vol (A4C): 36.8 ml 18.08 ml/m  AORTIC VALVE LVOT Vmax:   101.00 cm/s LVOT Vmean:  70.700 cm/s LVOT VTI:    0.208 m  AORTA Ao Root diam: 2.90 cm TRICUSPID VALVE TR Peak grad:   36.5 mmHg TR Vmax:        302.00 cm/s  SHUNTS Systemic VTI:  0.21 m Systemic Diam: 2.10 cm Ena Dawley MD Electronically signed by Ena Dawley MD Signature Date/Time: 04/04/2020/1:09:18 PM    Final    UE VENOUS DUPLEX ([facility] 7 am - 7 pm)  Result Date: 03/21/2020 UPPER VENOUS STUDY  Indications: Swelling Risk Factors: None identified. Limitations: Poor ultrasound/tissue interface and patient positioning. Comparison Study: No prior studies. Performing Technologist: Oliver Hum RVT  Examination Guidelines: A complete evaluation includes B-mode imaging, spectral Doppler, color Doppler, and power Doppler as needed of all accessible portions of each vessel. Bilateral testing is considered an integral part of a complete examination. Limited examinations for reoccurring indications may be performed as noted.  Right Findings: +----------+------------+---------+-----------+----------+-------+ RIGHT     CompressiblePhasicitySpontaneousPropertiesSummary +----------+------------+---------+-----------+----------+-------+ Subclavian    Full       Yes       Yes                      +----------+------------+---------+-----------+----------+-------+  Left Findings: +----------+------------+---------+-----------+----------+-----------------+ LEFT      CompressiblePhasicitySpontaneousProperties     Summary      +----------+------------+---------+-----------+----------+-----------------+ IJV           Full       Yes       Yes                                +----------+------------+---------+-----------+----------+-----------------+ Subclavian    Full       Yes       Yes                                 +----------+------------+---------+-----------+----------+-----------------+ Axillary      Full       Yes       Yes                                +----------+------------+---------+-----------+----------+-----------------+ Brachial      Full  Yes       Yes                                +----------+------------+---------+-----------+----------+-----------------+ Radial        Full                                                    +----------+------------+---------+-----------+----------+-----------------+ Ulnar         Full                                                    +----------+------------+---------+-----------+----------+-----------------+ Cephalic      None                                  Age Indeterminate +----------+------------+---------+-----------+----------+-----------------+ Basilic       Full                                                    +----------+------------+---------+-----------+----------+-----------------+  Summary:  Right: No evidence of thrombosis in the subclavian.  Left: No evidence of deep vein thrombosis in the upper extremity. Findings consistent with age indeterminate superficial vein thrombosis involving the left cephalic vein.  *See table(s) above for measurements and observations.  Diagnosing physician: Servando Snare MD Electronically signed by Servando Snare MD on 03/21/2020 at 1:01:01 PM.    Final        Subjective: Patient has rib pain with any movement, spasms and pain from time to time.  She has no's of breath, confusion, fever, sputum.  No syncope.  No falls.  Discharge Exam: Vitals:   04/06/20 0915 04/06/20 1139  BP:  134/80  Pulse: 72 60  Resp:  15  Temp:  98.1 F (36.7 C)  SpO2:  96%   Vitals:   04/06/20 0406 04/06/20 0731 04/06/20 0915 04/06/20 1139  BP:  (!) 156/67  134/80  Pulse:  60 72 60  Resp:  17  15  Temp: 97.7 F (36.5 C) 98.1 F (36.7 C)  98.1 F (36.7 C)   TempSrc:  Oral  Oral  SpO2:  96%  96%  Weight:      Height:        General: Pt is alert, awake, not in acute distress sitting up in recliner. Cardiovascular: RRR, nl S1-S2, no murmurs appreciated.   No LE edema.   Respiratory: Normal respiratory rate and rhythm.  CTAB without rales or wheezes. Abdominal: Abdomen soft and non-tender.  No distension or HSM.   Neuro/Psych: Strength symmetric in upper and lower extremities.  Judgment and insight appear normal.   The results of significant diagnostics from this hospitalization (including imaging, microbiology, ancillary and laboratory) are listed below for reference.     Microbiology: Recent Results (from the past 240 hour(s))  Urine C&S     Status: Abnormal   Collection Time: 04/03/20  2:11 AM   Specimen: Urine, Random  Result Value Ref  Range Status   Specimen Description   Final    URINE, RANDOM Performed at [facility], [place] [address] [place], [place] [address]    Special Requests   Final    NONE Performed at [facility], [place] [address] [place], [place] [address]    Culture MULTIPLE SPECIES PRESENT, SUGGEST RECOLLECTION (A)  Final   Report Status 04/04/2020 FINAL  Final  Respiratory Panel by RT PCR (Flu A&B, Covid) - Nasopharyngeal Swab     Status: None   Collection Time: 04/03/20  6:55 AM   Specimen: Nasopharyngeal Swab  Result Value Ref Range Status   SARS Coronavirus 2 by RT PCR NEGATIVE NEGATIVE Final    Comment: (NOTE) SARS-CoV-2 target nucleic acids are NOT DETECTED.  The SARS-CoV-2 RNA is generally detectable in upper respiratoy specimens during the acute phase of infection. The lowest concentration of SARS-CoV-2 viral copies this assay can detect is 131 copies/mL. A negative result does not preclude SARS-Cov-2 infection and should not be used as the sole basis for treatment or other patient management decisions. A negative result may occur with  improper specimen  collection/handling, submission of specimen other than nasopharyngeal swab, presence of viral mutation(s) within the areas targeted by this assay, and inadequate number of viral copies (<131 copies/mL). A negative result must be combined with clinical observations, patient history, and epidemiological information. The expected result is Negative.  Fact Sheet for Patients:  PinkCheek.be  Fact Sheet for Healthcare Providers:  GravelBags.it  This test is no t yet approved or cleared by the [place] FDA and  has been authorized for detection and/or diagnosis of SARS-CoV-2 by FDA under an Emergency Use Authorization (EUA). This EUA will remain  in effect (meaning this test can be used) for the duration of the COVID-19 declaration under Section 564(b)(1) of the Act, 21 U.S.C. section 360bbb-3(b)(1), unless the authorization is terminated or revoked sooner.     Influenza A by PCR NEGATIVE NEGATIVE Final   Influenza B by PCR NEGATIVE NEGATIVE Final    Comment: (NOTE) The Xpert Xpress SARS-CoV-2/FLU/RSV assay is intended as an aid in  the diagnosis of influenza from Nasopharyngeal swab specimens and  should not be used as a sole basis for treatment. Nasal washings and  aspirates are unacceptable for Xpert Xpress SARS-CoV-2/FLU/RSV  testing.  Fact Sheet for Patients: PinkCheek.be  Fact Sheet for Healthcare Providers: GravelBags.it  This test is not yet approved or cleared by the [place] FDA and  has been authorized for detection and/or diagnosis of SARS-CoV-2 by  FDA under an Emergency Use Authorization (EUA). This EUA will remain  in effect (meaning this test can be used) for the duration of the  Covid-19 declaration under Section 564(b)(1) of the Act, 21  U.S.C. section 360bbb-3(b)(1), unless the authorization is  terminated or revoked. Performed at [facility], [place] [address], [place], [place] [address]   MRSA PCR Screening     Status: None   Collection Time: 04/03/20  9:30 PM   Specimen: Nasal Mucosa; Nasopharyngeal  Result Value Ref Range Status   MRSA by PCR NEGATIVE NEGATIVE Final    Comment:        The GeneXpert MRSA Assay (FDA approved for NASAL specimens only), is one component of a comprehensive MRSA colonization surveillance program. It is not intended to diagnose MRSA infection nor to guide or monitor treatment for MRSA infections. Performed at [facility], [place] [address], [place], [place]  [address]      Labs: BNP (last 3 results) No results for input(s): BNP in the last 8760 hours. Basic Metabolic Panel: Recent Labs  Lab 04/03/20 0526 04/04/20 1829  NA 133* 130*  K 3.4* 4.5  CL 98 99  CO2 27 20*  GLUCOSE 125* 112*  BUN 13 18  CREATININE 0.72 0.91  CALCIUM 9.0 8.4*  MG 2.0 1.9   Liver Function Tests: Recent Labs  Lab 04/04/20 1829  AST 20  ALT 16  ALKPHOS 62  BILITOT 0.8  PROT 5.6*  ALBUMIN 3.0*   No results for input(s): LIPASE, AMYLASE in the last 168 hours. No results for input(s): AMMONIA in the last 168 hours. CBC: Recent Labs  Lab 04/03/20 0526 04/04/20 1829 04/05/20 1028 04/06/20 0831  WBC 9.1 10.3 7.3 6.8  NEUTROABS 7.5  --   --   --   HGB 13.5 13.2 13.3 13.6  HCT 40.8 41.0 40.7 42.7  MCV 90.9 92.3 93.3 93.0  PLT 150 185 183 192   Cardiac Enzymes: No results for input(s): CKTOTAL, CKMB, CKMBINDEX, TROPONINI in the last 168 hours. BNP: Invalid input(s): POCBNP CBG: Recent Labs  Lab 04/05/20 0723 04/05/20 1140 04/05/20 1550 04/05/20 1953 04/05/20 2349  GLUCAP 114* 127* 117* 136* 128*   D-Dimer No results for input(s): DDIMER in the last 72 hours. Hgb A1c No results for input(s): HGBA1C in the last 72 hours. Lipid Profile No results for input(s): CHOL, HDL, LDLCALC, TRIG, CHOLHDL, LDLDIRECT in the last 72 hours. Thyroid function studies No  results for input(s): TSH, T4TOTAL, T3FREE, THYROIDAB in the last 72 hours.  Invalid input(s): FREET3 Anemia work up No results for input(s): VITAMINB12, FOLATE, FERRITIN, TIBC, IRON, RETICCTPCT in the last 72 hours. Urinalysis    Component Value Date/Time   COLORURINE YELLOW 04/03/2020 0210   APPEARANCEUR HAZY (A) 04/03/2020 0210   LABSPEC 1.029 04/03/2020 0210   PHURINE 5.0 04/03/2020 0210   GLUCOSEU NEGATIVE 04/03/2020 0210   HGBUR NEGATIVE 04/03/2020 0210   BILIRUBINUR NEGATIVE 04/03/2020 0210   KETONESUR NEGATIVE 04/03/2020 0210   PROTEINUR 100 (A) 04/03/2020 0210   NITRITE NEGATIVE 04/03/2020 0210   LEUKOCYTESUR MODERATE (A) 04/03/2020 0210   Sepsis Labs Invalid input(s): PROCALCITONIN,  WBC,  LACTICIDVEN Microbiology Recent Results (from the past 240 hour(s))  Urine C&S     Status: Abnormal   Collection Time: 04/03/20  2:11 AM   Specimen: Urine, Random  Result Value Ref Range Status   Specimen Description   Final    URINE, RANDOM Performed at [facility], [place] [address], [place], [place] [address]    Special Requests   Final    NONE Performed at [facility], [place] [address] [place], [place] [address]    Culture MULTIPLE SPECIES PRESENT, SUGGEST RECOLLECTION (A)  Final   Report Status 04/04/2020 FINAL  Final  Respiratory Panel by RT PCR (Flu A&B, Covid) - Nasopharyngeal Swab     Status: None   Collection Time: 04/03/20  6:55 AM   Specimen: Nasopharyngeal Swab  Result Value Ref Range Status   SARS Coronavirus 2 by RT PCR NEGATIVE NEGATIVE Final    Comment: (NOTE) SARS-CoV-2 target nucleic acids are NOT DETECTED.  The SARS-CoV-2 RNA is generally detectable in upper respiratoy specimens during the acute phase of infection. The lowest concentration of SARS-CoV-2 viral copies this assay can detect is 131 copies/mL. A negative result does not preclude SARS-Cov-2 infection and should not be used as the sole basis  for treatment  or other patient management decisions. A negative result may occur with  improper specimen collection/handling, submission of specimen other than nasopharyngeal swab, presence of viral mutation(s) within the areas targeted by this assay, and inadequate number of viral copies (<131 copies/mL). A negative result must be combined with clinical observations, patient history, and epidemiological information. The expected result is Negative.  Fact Sheet for Patients:  PinkCheek.be  Fact Sheet for Healthcare Providers:  GravelBags.it  This test is no t yet approved or cleared by the [place] FDA and  has been authorized for detection and/or diagnosis of SARS-CoV-2 by FDA under an Emergency Use Authorization (EUA). This EUA will remain  in effect (meaning this test can be used) for the duration of the COVID-19 declaration under Section 564(b)(1) of the Act, 21 U.S.C. section 360bbb-3(b)(1), unless the authorization is terminated or revoked sooner.     Influenza A by PCR NEGATIVE NEGATIVE Final   Influenza B by PCR NEGATIVE NEGATIVE Final    Comment: (NOTE) The Xpert Xpress SARS-CoV-2/FLU/RSV assay is intended as an aid in  the diagnosis of influenza from Nasopharyngeal swab specimens and  should not be used as a sole basis for treatment. Nasal washings and  aspirates are unacceptable for Xpert Xpress SARS-CoV-2/FLU/RSV  testing.  Fact Sheet for Patients: PinkCheek.be  Fact Sheet for Healthcare Providers: GravelBags.it  This test is not yet approved or cleared by the [place] FDA and  has been authorized for detection and/or diagnosis of SARS-CoV-2 by  FDA under an Emergency Use Authorization (EUA). This EUA will remain  in effect (meaning this test can be used) for the duration of the  Covid-19 declaration under Section 564(b)(1) of the Act, 21  U.S.C.  section 360bbb-3(b)(1), unless the authorization is  terminated or revoked. Performed at [facility], [place] [address] [place], [place] [address]   MRSA PCR Screening     Status: None   Collection Time: 04/03/20  9:30 PM   Specimen: Nasal Mucosa; Nasopharyngeal  Result Value Ref Range Status   MRSA by PCR NEGATIVE NEGATIVE Final    Comment:        The GeneXpert MRSA Assay (FDA approved for NASAL specimens only), is one component of a comprehensive MRSA colonization surveillance program. It is not intended to diagnose MRSA infection nor to guide or monitor treatment for MRSA infections. Performed at [facility], [place] [address] [place], [place] [address]      Time coordinating discharge: 35 minutes The [place] controlled substances registry was reviewed for this patient prior to filling the <5 days supply controlled substances script.      SIGNED:   Edwin Dada, MD  [facility] 04/06/2020, 8:49 PM

## 2020-04-06 NOTE — Progress Notes (Cosign Needed)
   Durable Medical Equipment (From admission, onward)       Start     Ordered  04/06/20 1114   DME standard manual wheelchair with seat cushion  Once      Comments: Patient suffers from multiple rib fractures which impairs their ability to perform daily activities like bathing, dressing, grooming, and toileting in the home.  A walker will not resolve issue with performing activities of daily living. A wheelchair will allow patient to safely perform daily activities. Patient can safely propel the wheelchair in the home or has a caregiver who can provide assistance. Length of need 6 months . Accessories: elevating leg rests (ELRs), wheel locks, extensions and anti-tippers.  04/06/20 1113

## 2020-04-06 NOTE — Progress Notes (Signed)
Occupational Therapy Treatment Patient Details Name: Mary Rice MRN: [id number] DOB: 02-18-1933 Today's Date: 04/06/2020    History of present illness Mary Rice is a 86 y.o. female with medical history significant of a fib, DVT/PE, HTN, dyslipidemia, and recent fall with vertebral compression fx and kypholasty 02/26/20.  Presents with left flank/chest pain after fall and found to have rib fx L 5-7, emboli RLL & L atrial appendage thrombus.   OT comments  Pt seated in recliner agreeable to therapy session. Pt continues to have limitations mostly due to pain at this time. Pt tolerating room level mobility using RW, requiring minA throughout. Pt requiring up to maxA for LB ADL, minA for seated UB ADL with education provided both to pt and pt's daughter-in-law on safety and compensatory techniques for completing ADL/mobility tasks given continued pain with both parties verbalizing understanding. Pt is hopeful for d/c home today. Continue to recommend [place] services to progress pt's overall safety and independence with ADL and mobility. Will follow while acutely admitted.    Follow Up Recommendations  Home health OT;Supervision/Assistance - 24 hour    Equipment Recommendations  None recommended by OT          Precautions / Restrictions Precautions Precautions: Fall Precaution Comments: reports L knee buckles at time Restrictions Weight Bearing Restrictions: No       Mobility Bed Mobility Overal bed mobility: Needs Assistance Bed Mobility: Supine to Sit     Supine to sit: Mod assist;+2 for physical assistance;HOB elevated     General bed mobility comments: OOB in recliner upon arrival  Transfers Overall transfer level: Needs assistance Equipment used: Rolling walker (2 wheeled) Transfers: Sit to/from Stand Sit to Stand: Min assist         General transfer comment: VCs for hand placement with assist to boost to standing and steady at RW; stood from recliner and BSC over  toilet     Balance Overall balance assessment: Needs assistance Sitting-balance support: Single extremity supported;Feet supported Sitting balance-Leahy Scale: Poor Sitting balance - Comments: reliant on UE support due to pain   Standing balance support: Bilateral upper extremity supported Standing balance-Leahy Scale: Poor Standing balance comment: reliant on UE support                            ADL either performed or assessed with clinical judgement   ADL Overall ADL's : Needs assistance/impaired     Grooming: Wash/dry hands;Set up;Sitting           Upper Body Dressing : Minimal assistance;Sitting Upper Body Dressing Details (indicate cue type and reason): educated in compensatory strategy for completing task  Lower Body Dressing: Maximal assistance;Sit to/from stand;With caregiver independent assisting Lower Body Dressing Details (indicate cue type and reason): donning pants Toilet Transfer: Minimal assistance;Ambulation;RW;BSC Toilet Transfer Details (indicate cue type and reason): BSC over toilet  Toileting- Clothing Manipulation and Hygiene: Moderate assistance;Sit to/from stand Toileting - Clothing Manipulation Details (indicate cue type and reason): assist for clothing management (gown and underwear), for standing balance during pericare     Functional mobility during ADLs: Minimal assistance;Rolling walker       Vision       Perception     Praxis      Cognition Arousal/Alertness: Awake/alert Behavior During Therapy: WFL for tasks assessed/performed Overall Cognitive Status: Within Functional Limits for tasks assessed  Exercises     Shoulder Instructions       General Comments pt's w/c delivered during session with education provided on w/c safety and parts/ management    Pertinent Vitals/ Pain       Pain Assessment: Faces Faces Pain Scale: Hurts whole lot Pain Location:  ribs Pain Descriptors / Indicators: Aching;Grimacing;Spasm;Sharp Pain Intervention(s): Monitored during session;Repositioned  Home Living                                          Prior Functioning/Environment              Frequency  Min 2X/week        Progress Toward Goals  OT Goals(current goals can now be found in the care plan section)  Progress towards OT goals: Progressing toward goals  Acute Rehab OT Goals Patient Stated Goal: to go home OT Goal Formulation: With patient Time For Goal Achievement: 04/19/20 Potential to Achieve Goals: Good ADL Goals Pt Will Perform Grooming: sitting;with min guard assist;standing Pt Will Perform Lower Body Bathing: with min assist;sit to/from stand;sitting/lateral leans Pt Will Perform Upper Body Dressing: with supervision;sitting Pt Will Perform Lower Body Dressing: with min assist;sit to/from stand;sitting/lateral leans Pt Will Transfer to Toilet: with min guard assist;ambulating Pt Will Perform Toileting - Clothing Manipulation and hygiene: with min guard assist;sit to/from stand;sitting/lateral leans Additional ADL Goal #1: Pt will perform bed mobility with minA as precursor to EOB/OOB ADL.  Plan Discharge plan remains appropriate    Co-evaluation                 AM-PAC OT "6 Clicks" Daily Activity     Outcome Measure   Help from another person eating meals?: A Little Help from another person taking care of personal grooming?: A Little Help from another person toileting, which includes using toliet, bedpan, or urinal?: A Lot Help from another person bathing (including washing, rinsing, drying)?: A Lot Help from another person to put on and taking off regular upper body clothing?: A Little Help from another person to put on and taking off regular lower body clothing?: A Lot 6 Click Score: 15    End of Session Equipment Utilized During Treatment: Rolling walker;Gait belt  OT Visit Diagnosis:  Other abnormalities of gait and mobility (R26.89);Pain;History of falling (Z91.81) Pain - Right/Left: Left Pain - part of body:  (ribcage)   Activity Tolerance Patient tolerated treatment well   Patient Left in chair;with call bell/phone within reach;with family/visitor present   Nurse Communication Mobility status        Time: 2202-5427 OT Time Calculation (min): 23 min  Charges: OT General Charges $OT Visit: 1 Visit OT Treatments $Self Care/Home Management : 23-37 mins  Lou Cal, OT Acute Rehabilitation Services Pager [phone] Office [place] 04/06/2020, 5:01 PM

## 2020-04-06 NOTE — Progress Notes (Addendum)
Progress Note  Patient Name: Mary Rice Date of Encounter: 04/06/2020  [facility] Cardiologist: Ida Rogue, MD (2017)  Subjective   Aware that HR has been running low, but no sx from this. Pain w/ any movements  Inpatient Medications    Scheduled Meds: . acetaminophen  650 mg Oral Q6H  . apixaban  10 mg Oral BID   Followed by  . [START ON 04/12/2020] apixaban  5 mg Oral BID  . atenolol  12.5 mg Oral Daily  . buPROPion  150 mg Oral Daily  . Chlorhexidine Gluconate Cloth  6 each Topical Daily  . docusate sodium  100 mg Oral QHS  . gabapentin  100 mg Oral TID  . lidocaine  1 patch Transdermal Q24H  . methocarbamol  500 mg Oral Q6H  . pantoprazole  40 mg Oral Daily  . polyethylene glycol  17 g Oral Daily  . pravastatin  20 mg Oral QHS  . sodium chloride flush  3 mL Intravenous Q12H  . sodium chloride  1 g Oral BID WC   Continuous Infusions:  PRN Meds: acetaminophen **OR** acetaminophen, calcium carbonate, HYDROmorphone (DILAUDID) injection, ondansetron **OR** ondansetron (ZOFRAN) IV, oxyCODONE   Vital Signs    Vitals:   04/05/20 2348 04/06/20 0323 04/06/20 0406 04/06/20 0731  BP:  139/68  (!) 156/67  Pulse:  (!) 58  60  Resp:  16  17  Temp: 97.7 F (36.5 C) 97.7 F (36.5 C) 97.7 F (36.5 C) 98.1 F (36.7 C)  TempSrc: Oral Oral  Oral  SpO2:  93%  96%  Weight:      Height:        Intake/Output Summary (Last 24 hours) at 04/06/2020 0915 Last data filed at 04/06/2020 0600 Gross per 24 hour  Intake 417 ml  Output 1200 ml  Net -783 ml   Last 3 Weights 04/03/2020 03/21/2020 03/21/2020  Weight (lbs) 198 lb 198 lb 198 lb  Weight (kg) 89.812 kg 89.812 kg 89.812 kg      Telemetry    Afib w/ HR 40s at times, especially while asleep, no pauses > 2.5 sec seen - Personally Reviewed  ECG    No new tracings - Personally Reviewed  Physical Exam   GEN: Elderly female, No acute distress.   Neck: No JVD Cardiac: Irreg R&R, 2-3/6 murmur, no rubs, or  gallops.  Respiratory: diminished to auscultation bilaterally with a few rales in the bases. GI: Soft, nontender, non-distended  MS: trace-1+ LE edema; No deformity. Neuro:  Nonfocal  Psych: Normal affect    Labs    High Sensitivity Troponin:  No results for input(s): TROPONINIHS in the last 720 hours.    Chemistry Recent Labs  Lab 04/03/20 0526 04/04/20 1829  NA 133* 130*  K 3.4* 4.5  CL 98 99  CO2 27 20*  GLUCOSE 125* 112*  BUN 13 18  CREATININE 0.72 0.91  CALCIUM 9.0 8.4*  PROT  --  5.6*  ALBUMIN  --  3.0*  AST  --  20  ALT  --  16  ALKPHOS  --  62  BILITOT  --  0.8  GFRNONAA >60 58*  GFRAA >60 >60  ANIONGAP 8 11     Hematology Recent Labs  Lab 04/04/20 1829 04/05/20 1028 04/06/20 0831  WBC 10.3 7.3 6.8  RBC 4.44 4.36 4.59  HGB 13.2 13.3 13.6  HCT 41.0 40.7 42.7  MCV 92.3 93.3 93.0  MCH 29.7 30.5 29.6  MCHC 32.2 32.7  31.9  RDW 17.1* 17.2* 17.2*  PLT 185 183 192    BNPNo results for input(s): BNP, PROBNP in the last 168 hours.   DDimer No results for input(s): DDIMER in the last 168 hours.   Radiology    ECHOCARDIOGRAM COMPLETE  Result Date: 04/04/2020    ECHOCARDIOGRAM REPORT   Patient Name:   Mary Rice Date of Exam: 04/04/2020 Medical Rec #:  [id number]     Height:       68.0 in Accession #:    [id number]    Weight:       198.0 lb Date of Birth:  [birth year]      BSA:          2.035 m Patient Age:    17 years      BP:           134/58 mmHg Patient Gender: F             HR:           69 bpm. Exam Location:  Inpatient Procedure: 2D Echo, Color Doppler, Cardiac Doppler and Intracardiac            Opacification Agent Indications:    Atrial Fibrillation 427.31 / I48.91  History:        Patient has prior history of Echocardiogram examinations, most                 recent 10/27/2015. COPD, Arrythmias:Atrial Fibrillation; Risk                 Factors:Hypertension, Diabetes and Dyslipidemia. Pulmonary                 embolism.  Sonographer:    Vickie Epley RDCS  Referring Phys: [id number] [place]  1. Left ventricular ejection fraction, by estimation, is 65 to 70%. The left ventricle has normal function. The left ventricle has no regional wall motion abnormalities. Left ventricular diastolic function could not be evaluated.  2. Right ventricular systolic function is normal. The right ventricular size is normal. There is mildly elevated pulmonary artery systolic pressure. The estimated right ventricular systolic pressure is 93.8 mmHg.  3. The mitral valve is normal in structure. No evidence of mitral valve regurgitation. No evidence of mitral stenosis.  4. Tricuspid valve regurgitation is moderate.  5. The aortic valve is normal in structure. There is moderate calcification of the aortic valve. There is moderate thickening of the aortic valve. Aortic valve regurgitation is trivial. Mild to moderate aortic valve sclerosis/calcification is present, without any evidence of aortic stenosis.  6. The inferior vena cava is normal in size with greater than 50% respiratory variability, suggesting right atrial pressure of 3 mmHg. FINDINGS  Left Ventricle: Left ventricular ejection fraction, by estimation, is 65 to 70%. The left ventricle has normal function. The left ventricle has no regional wall motion abnormalities. Definity contrast agent was given IV to delineate the left ventricular  endocardial borders. The left ventricular internal cavity size was normal in size. There is no left ventricular hypertrophy. Left ventricular diastolic function could not be evaluated due to atrial fibrillation. Left ventricular diastolic function could  not be evaluated. Right Ventricle: The right ventricular size is normal. No increase in right ventricular wall thickness. Right ventricular systolic function is normal. There is mildly elevated pulmonary artery systolic pressure. The tricuspid regurgitant velocity is 3.02  m/s, and with an assumed right atrial pressure of 3 mmHg, the  estimated right ventricular systolic  pressure is 39.5 mmHg. Left Atrium: Left atrial size was normal in size. Right Atrium: Right atrial size was normal in size. Pericardium: There is no evidence of pericardial effusion. Mitral Valve: The mitral valve is normal in structure. There is mild thickening of the mitral valve leaflet(s). Mild mitral annular calcification. No evidence of mitral valve regurgitation. No evidence of mitral valve stenosis. Tricuspid Valve: The tricuspid valve is normal in structure. Tricuspid valve regurgitation is moderate . No evidence of tricuspid stenosis. Aortic Valve: The aortic valve is normal in structure. There is moderate calcification of the aortic valve. There is moderate thickening of the aortic valve. Aortic valve regurgitation is trivial. Mild to moderate aortic valve sclerosis/calcification is present, without any evidence of aortic stenosis. Pulmonic Valve: The pulmonic valve was normal in structure. Pulmonic valve regurgitation is not visualized. No evidence of pulmonic stenosis. Aorta: The aortic root is normal in size and structure. Venous: The inferior vena cava is normal in size with greater than 50% respiratory variability, suggesting right atrial pressure of 3 mmHg. IAS/Shunts: No atrial level shunt detected by color flow Doppler.  LEFT VENTRICLE PLAX 2D LVIDd:         4.70 cm LVIDs:         3.90 cm LV PW:         0.70 cm LV IVS:        0.70 cm LVOT diam:     2.10 cm LV SV:         72 LV SV Index:   35 LVOT Area:     3.46 cm  LV Volumes (MOD) LV vol d, MOD A4C: 69.9 ml LV vol s, MOD A4C: 27.5 ml LV SV MOD A4C:     69.9 ml RIGHT VENTRICLE RV S prime:     7.62 cm/s TAPSE (M-mode): 1.5 cm LEFT ATRIUM           Index       RIGHT ATRIUM           Index LA diam:      3.70 cm 1.82 cm/m  RA Area:     15.70 cm LA Vol (A2C): 41.7 ml 20.49 ml/m RA Volume:   38.60 ml  18.97 ml/m LA Vol (A4C): 36.8 ml 18.08 ml/m  AORTIC VALVE LVOT Vmax:   101.00 cm/s LVOT Vmean:  70.700 cm/s  LVOT VTI:    0.208 m  AORTA Ao Root diam: 2.90 cm TRICUSPID VALVE TR Peak grad:   36.5 mmHg TR Vmax:        302.00 cm/s  SHUNTS Systemic VTI:  0.21 m Systemic Diam: 2.10 cm Ena Dawley MD Electronically signed by Ena Dawley MD Signature Date/Time: 04/04/2020/1:09:18 PM    Final     Cardiac Studies   Echo 04/04/20: 1. Left ventricular ejection fraction, by estimation, is 65 to 70%. The  left ventricle has normal function. The left ventricle has no regional  wall motion abnormalities. Left ventricular diastolic function could not  be evaluated.  2. Right ventricular systolic function is normal. The right ventricular  size is normal. There is mildly elevated pulmonary artery systolic  pressure. The estimated right ventricular systolic pressure is 53.9 mmHg.  3. The mitral valve is normal in structure. No evidence of mitral valve  regurgitation. No evidence of mitral stenosis.  4. Tricuspid valve regurgitation is moderate.  5. The aortic valve is normal in structure. There is moderate  calcification of the aortic valve. There is moderate thickening of the  aortic valve. Aortic valve regurgitation is trivial. Mild to moderate  aortic valve sclerosis/calcification is present, with no aortic stenosis.  6. The inferior vena cava is normal in size with greater than 50%  respiratory variability, suggesting right atrial pressure of 3 mmHg.   Patient Profile     86 y.o. female with a hx of permanent Afib, PE/DVT, HTN, and HLD who is being seen for the evaluation of LAA thrombus and acute PE - found incidentally on CT chest obtained to evaluate rib pain after a mechanical fall.  Assessment & Plan    Left atrial appendage thrombus Pulmonary embolism Hx of DVT and bilateral PE (?2007 after knee surgery) - anticoag d/c'd after a fall - CHADS-VASc = 6, ASA stopped and Eliquis started this admit - H&H stable  Lower extremity edema - has at baseline - no other sx volume overload by  exam  Permanent Afib - rates 40s overnight, no sx - atenolol decreased, continue to follow  Hypertension - on low-dose atenolol - SBP 130s-150s last 24 hr - add amlodipine 2.5 mg qd and follow  Pt has f/u arranged in [place] will sign off.   Medication Recommendations:  Eliquis 5 mg BID (after completing load with 10 mg BID x 7 days), atenolol 12.5 mg daily (decreased from 25 mg), amlodipine 2.5 mg daily. Discontinue aspirin. Other recommendations (labs, testing, etc): None Follow up as an outpatient: Scheduled with Dr. Rockey Situ on 05/10/2020.     For questions or updates, please contact [place] Please consult [email or web address] for contact info under     Signed, Rosaria Ferries, PA-C  04/06/2020, 9:15 AM    Patient seen and examined.  Agree with above documentation.  On exam, patient is alert and oriented, normal rate, irregular, no murmurs, lungs CTAB, 1+ LE edema.  Stable for discharge today on Eliquis.  Atenolol dose reduced to 12.5 mg daily given bradycardia.  Cardiology follow-up has been arranged with Dr. Rockey Situ on 05/10/2020.  Donato Heinz, MD

## 2020-04-06 NOTE — Care Management Important Message (Signed)
Important Message  Patient Details  Name: Mary Rice MRN: [id number] Date of Birth: 1933-06-28   Medicare Important Message Given:  Yes   IM sent the patient home address.   Phyllip Claw 04/06/2020, 3:56 PM

## 2020-04-06 NOTE — Progress Notes (Signed)
Pt with discharge orders. IV removed. Discharge paperwork reviewed with pt and daughter-in-law. All questions answered. Prescriptions sent to transition of care. One prescription electronically faxed to pharmacy. Pt escorted out to private vehicle via wheelchair with all belongings including medications from transition of care and wheelchair.

## 2020-04-06 NOTE — TOC Transition Note (Addendum)
Transition of Care (TOC) - CM/SW Discharge Note Marvetta Gibbons RN,BSN Transitions of Care Unit 4NP (non trauma) - RN Case Manager See Treatment Team for direct Phone #  Patient Details  Name: Mary Rice MRN: [id number] Date of Birth: 05-29-1933  Transition of Care [facility]) CM/SW Contact:  Dawayne Patricia, RN Phone Number: 04/06/2020, 11:41 AM   Clinical Narrative:    Pt stable for transition home today, was active with [facility] for [facility] services (PT) prior to admission- recs now for PT/OT- will have MD place new orders for continuation of [place] services. Per bedside RN - family requesting w/c for home- MD also placed DME order for w/c-  Pt is new to Eliquis- copay $33 per benefits check- TOC pharmacy to fill first 30 day with 30 free card.   Call made to [employer] line for DME need- w/c once processed will be delivered to room prior to discharge. Update- 1300- call received from [place] at [place]- they do not have any w/c in stock for the size pt needs- call made to daughter Levada Dy- will try [facility] next.  Spoke with Caryl Pina at [place] they do have 18in w/c in stock will process and see if Caryl Pina would like them to deliver through [employer] with [facility] for resumption of [facility] services   Final next level of care: Home w Home Health Services Barriers to Discharge: No Barriers Identified   Patient Goals and CMS Choice Patient states their goals for this hospitalization and ongoing recovery are:: return home CMS Medicare.gov Compare Post Acute Care list provided to:: Patient Choice offered to / list presented to : Patient  Discharge Placement               Home with [facility]        Discharge Plan and Services   Discharge Planning Services: CM Consult Post Acute Care Choice: Home Health, Resumption of Svcs/PTA Provider          DME Arranged: Wheelchair manual DME Agency: [employer] Date DME Agency Contacted: 04/06/20 Time DME Agency Contacted: [id number] Representative spoke with  at DME Agency: Adela Lank HH Arranged: PT, OT [place] Agency: [place] ([place]) Date [place]: 04/06/20 Time [place]: [id number] Representative spoke with at [place]: [place] ([place]) Interventions     Readmission Risk Interventions Readmission Risk Prevention Plan 04/06/2020  Transportation Screening Complete  PCP or Specialist Appt within 5-7 Days Complete  Home Care Screening Complete  Medication Review (RN CM) Complete  Some recent data might be hidden

## 2020-05-10 ENCOUNTER — Encounter: Payer: Self-pay | Admitting: Cardiovascular Disease

## 2020-05-10 ENCOUNTER — Other Ambulatory Visit: Payer: Self-pay

## 2020-05-10 ENCOUNTER — Ambulatory Visit (INDEPENDENT_AMBULATORY_CARE_PROVIDER_SITE_OTHER): Payer: Medicare Other | Admitting: Cardiovascular Disease

## 2020-05-10 VITALS — BP 130/80 | HR 77 | Ht 68.0 in | Wt 187.0 lb

## 2020-05-10 DIAGNOSIS — W19XXXS Unspecified fall, sequela: Secondary | ICD-10-CM

## 2020-05-10 DIAGNOSIS — I4821 Permanent atrial fibrillation: Secondary | ICD-10-CM

## 2020-05-10 DIAGNOSIS — I2699 Other pulmonary embolism without acute cor pulmonale: Secondary | ICD-10-CM

## 2020-05-10 DIAGNOSIS — I825Y9 Chronic embolism and thrombosis of unspecified deep veins of unspecified proximal lower extremity: Secondary | ICD-10-CM

## 2020-05-10 DIAGNOSIS — E871 Hypo-osmolality and hyponatremia: Secondary | ICD-10-CM

## 2020-05-10 NOTE — Patient Instructions (Signed)
Medication Instructions:  No changes  If you need a refill on your cardiac medications before your next appointment, please call your pharmacy.    Lab work: No new labs needed   If you have labs (blood work) drawn today and your tests are completely normal, you will receive your results only by: . MyChart Message (if you have MyChart) OR . A paper copy in the mail If you have any lab test that is abnormal or we need to change your treatment, we will call you to review the results.   Testing/Procedures: No new testing needed   Follow-Up: At [facility], you and your health needs are our priority.  As part of our continuing mission to provide you with exceptional heart care, we have created designated Provider Care Teams.  These Care Teams include your primary Cardiologist (physician) and Advanced Practice Providers (APPs -  Physician Assistants and Nurse Practitioners) who all work together to provide you with the care you need, when you need it.  . You will need a follow up appointment in 6 months  . Providers on your designated Care Team:   . Christopher Berge, NP . Ryan Dunn, PA-C . Jacquelyn Visser, PA-C  Any Other Special Instructions Will Be Listed Below (If Applicable).  COVID-19 Vaccine Information can be found at: [email or web address] For questions related to vaccine distribution or appointments, please email [email or web address] or call [phone].     

## 2020-05-10 NOTE — Progress Notes (Signed)
Cardiology Office Note  Date:  05/10/2020   ID:  Mary Rice, DOB Feb 20, 1933, MRN [id number]  PCP:  Mary Aus, MD   Chief Complaint  Patient presents with  . New Patient (Initial Visit)    Hospital F/U; Meds verbally reviewed with patient.    HPI:  Mary Rice is a pleasant 86 year old woman  Permanent atrial fibrillation reactive airway disease,  hypertension,  DVT with PE after a patella fracture several years ago Off Eliquis after a fall several months ago Covid infection 2020 who presents for new patient evaluation of her persistent atrial fibrillation, hospital follow-up, acute pulmonary embolism  Last seen in cardiology clinic 2017 Admission to hospital March 14, 2020 for pneumonia Admission to the hospital September 13 altered mental status, weakness Hyponatremia, discharged on salt pills  Readmission to the hospital April 03, 2020 after a fall She had taken tramadol, felt loopy Secondary to rib pain leading to CT scan chest Acute PE found on CT scan Possible left atrial bandage thrombus She been off her Eliquis, this was restarted for PE and permanent atrial fibrillation  Head slow ventricular rate while in the hospital rates in the 40s but was asymptomatic  Atrial fibrillation dates back several years  On her last clinic visit to the 17, she was in atrial fibrillation We started her on anticoagulation at that time, she was lost to follow-up this was discontinued several months ago secondary to falls  Family presents with her today, continue to have problem with low sodium which has been a chronic issue She does drink high volume free water.  They report this has never been discussed with them before  Working with PT, maybe once or twice a week, legs are weak no regular exercise.  Significant gait instability  EKG personally reviewed by myself on todays visit Atrial fibrillation rate 77 bpm nonspecific T wave abnormality   PMH:   has a past  medical history of DVT (deep venous thrombosis) ([place]), Dysrhythmia, Hiatal hernia, Hyperlipemia, Hypertension, and Pulmonary embolism ([place]).  PSH:    Past Surgical History:  Procedure Laterality Date  . ABDOMINAL HYSTERECTOMY    . CATARACT EXTRACTION    . CHOLECYSTECTOMY    . ESOPHAGOGASTRODUODENOSCOPY N/A 08/19/2015   Procedure: ESOPHAGOGASTRODUODENOSCOPY (EGD);  Surgeon: Mauri Pole, MD;  Location: [facility] ENDOSCOPY;  Service: Endoscopy;  Laterality: N/A;  . KYPHOPLASTY N/A 02/26/2020   Procedure: KYPHOPLASTY T11;  Surgeon: Hessie Knows, MD;  Location: [facility] ORS;  Service: Orthopedics;  Laterality: N/A;    Current Outpatient Medications  Medication Sig Dispense Refill  . amLODipine (NORVASC) 2.5 MG tablet Take 1 tablet (2.5 mg total) by mouth daily. 30 tablet 3  . apixaban (ELIQUIS) 5 MG TABS tablet Take 10 mg (2 tabs) twice daily for 7 days then take 5 mg (1 tab) twice daily 72 tablet 0  . Ascorbic Acid (VITAMIN C) 250 MG CHEW Chew 750 mg by mouth daily.     Marland Kitchen atenolol (TENORMIN) 25 MG tablet Take 0.5 tablets (12.5 mg total) by mouth daily. 15 tablet 3  . buPROPion (WELLBUTRIN XL) 150 MG 24 hr tablet Take 150 mg by mouth at bedtime.     . calcium carbonate (TUMS - DOSED IN MG ELEMENTAL CALCIUM) 500 MG chewable tablet Chew 1 tablet by mouth daily as needed for indigestion or heartburn.    . Calcium-Vitamin D-Vitamin K (CALCIUM + D + K PO) Take 1 tablet by mouth 2 (two) times daily.    . Cholecalciferol 50  MCG (2000 UT) TABS Take 2,000 Units by mouth daily.    Marland Kitchen docusate sodium (COLACE) 100 MG capsule Take 300 mg by mouth daily as needed.     . Multiple Vitamin (MULTIVITAMIN WITH MINERALS) TABS tablet Take 1 tablet by mouth daily.    . pantoprazole (PROTONIX) 40 MG tablet Take 40 mg by mouth at bedtime.     . polyethylene glycol (MIRALAX / GLYCOLAX) 17 g packet Take 17 g by mouth daily as needed for mild constipation.     . pravastatin (PRAVACHOL) 20 MG tablet Take 20 mg by mouth at  bedtime.     . sodium chloride 1 g tablet Take 1 g by mouth 2 (two) times daily with a meal.     . acetaminophen (TYLENOL) 500 MG tablet Take 2 tablets (1,000 mg total) by mouth in the morning, at noon, and at bedtime.    Marland Kitchen oxyCODONE (OXY IR/ROXICODONE) 5 MG immediate release tablet Take 1 tablet (5 mg total) by mouth every 4 (four) hours as needed for moderate pain, severe pain or breakthrough pain. 16 tablet 0   No current facility-administered medications for this visit.     Allergies:   Biaxin [clarithromycin], Maxzide [hydrochlorothiazide w-triamterene], Penicillins, and Sulfa antibiotics   Social History:  The patient  reports that she has never smoked. She has never used smokeless tobacco. She reports that she does not drink alcohol and does not use drugs.   Family History:   family history includes Heart failure in her mother.    Review of Systems: Review of Systems  Constitutional: Negative.   HENT: Negative.   Respiratory: Negative.   Cardiovascular: Positive for leg swelling.  Gastrointestinal: Negative.   Musculoskeletal: Negative.        Leg weakness  Neurological: Negative.   Psychiatric/Behavioral: Negative.   All other systems reviewed and are negative.    PHYSICAL EXAM: VS:  BP 130/80 (BP Location: Left Arm, Patient Position: Sitting, Cuff Size: Normal)   Pulse 77   Ht 5\' 8"  (1.727 m)   Wt 187 lb (84.8 kg)   SpO2 95%   BMI 28.43 kg/m  , BMI Body mass index is 28.43 kg/m. Constitutional:  oriented to person, place, and time. No distress.  HENT:  Head: Grossly normal Eyes:  no discharge. No scleral icterus.  Neck: No JVD, no carotid bruits  Cardiovascular: Regular rate and rhythm, no murmurs appreciated 1+ pitting edema right lower extremity to the mid shins, left lower extremity with trace edema around the ankle, pitting Pulmonary/Chest: Clear to auscultation bilaterally, no wheezes or rales Abdominal: Soft.  no distension.  no tenderness.   Musculoskeletal: Normal range of motion Neurological:  normal muscle tone. Coordination normal. No atrophy Skin: Skin warm and dry Psychiatric: normal affect, pleasant   Recent Labs: 04/04/2020: ALT 16; BUN 18; Creatinine, Ser 0.91; Magnesium 1.9; Potassium 4.5; Sodium 130 04/06/2020: Hemoglobin 13.6; Platelets 192    Lipid Panel No results found for: CHOL, HDL, LDLCALC, TRIG    Wt Readings from Last 3 Encounters:  05/10/20 187 lb (84.8 kg)  04/03/20 198 lb (89.8 kg)  03/21/20 198 lb (89.8 kg)       ASSESSMENT AND PLAN:  Problem List Items Addressed This Visit      Cardiology Problems   Atrial fibrillation ([place]) - Primary   Relevant Orders   EKG 12-Lead     Other   Hyponatremia    Other Visit Diagnoses    Acute pulmonary embolism without acute cor  pulmonale, unspecified pulmonary embolism type (HCC)       Chronic deep vein thrombosis (DVT) of proximal vein of lower extremity, unspecified laterality ([place])       Fall, sequela         Permanent atrial fibrillation Rate well controlled, no medication changes made Continue Eliquis 5 twice daily Continue to work with PT to minimize falls  Hyponatremia Long discussion with her, she is on salt tabs twice daily, recommend she cut back on her free water.  Family reports she has high water intake  Falls Recommend PT, high fall risk, no desire to do regular exercise  Leg swelling Likely mixed presentation, mildly elevated right heart pressures also sits most of the day, component of dependent edema Would avoid Lasix given low sodium unless symptoms get worse  Possible left atrial thrombus On Eliquis as above    Total encounter time more than 45 minutes  Greater than 50% was spent in counseling and coordination of care with the patient    Signed, Esmond Plants, M.D., Ph.D. [place], [place]

## 2020-12-28 ENCOUNTER — Encounter: Payer: Self-pay | Admitting: Cardiovascular Disease

## 2020-12-28 ENCOUNTER — Other Ambulatory Visit: Payer: Self-pay | Admitting: Cardiovascular Disease

## 2020-12-28 ENCOUNTER — Ambulatory Visit (INDEPENDENT_AMBULATORY_CARE_PROVIDER_SITE_OTHER): Payer: Medicare Other | Admitting: Cardiovascular Disease

## 2020-12-28 ENCOUNTER — Other Ambulatory Visit: Payer: Self-pay

## 2020-12-28 VITALS — BP 150/60 | HR 48 | Ht 68.0 in | Wt 200.1 lb

## 2020-12-28 DIAGNOSIS — I10 Essential (primary) hypertension: Secondary | ICD-10-CM | POA: Diagnosis not present

## 2020-12-28 DIAGNOSIS — R0602 Shortness of breath: Secondary | ICD-10-CM

## 2020-12-28 DIAGNOSIS — I4821 Permanent atrial fibrillation: Secondary | ICD-10-CM

## 2020-12-28 DIAGNOSIS — E782 Mixed hyperlipidemia: Secondary | ICD-10-CM | POA: Diagnosis not present

## 2020-12-28 MED ORDER — LOSARTAN POTASSIUM 50 MG PO TABS: 50.0000 mg | ORAL_TABLET | Freq: Every day | ORAL | 3 refills | Status: DC

## 2020-12-28 MED ORDER — LOSARTAN POTASSIUM 50 MG PO TABS: 50.0000 mg | ORAL_TABLET | Freq: Every day | ORAL | 0 refills | Status: DC

## 2020-12-28 NOTE — Patient Instructions (Addendum)
Medication Instructions:  STOP Atenolol Amlodipine  START Losartan 50 mg Daily 30-day supply (no refills) sent to [facility] 90-day supply with 3 refills sent to [facility] when you need refills   If you need a refill on your cardiac medications before your next appointment, please call your pharmacy.    Lab work: No new labs needed  Testing/Procedures: No new testing needed   Follow-Up: At [facility], you and your health needs are our priority.  As part of our continuing mission to provide you with exceptional heart care, we have created designated Provider Care Teams.  These Care Teams include your primary Cardiologist (physician) and Advanced Practice Providers (APPs -  Physician Assistants and Nurse Practitioners) who all work together to provide you with the care you need, when you need it.  You will need a follow up appointment in 12 months  Providers on your designated Care Team:   Murray Hodgkins, NP Christell Faith, PA-C Marrianne Mood, PA-C Cadence Endicott, [place]  COVID-19 Vaccine Information can be found at: [email or web address].uk For questions related to vaccine distribution or appointments, please email [email or web address] or call [phone].

## 2020-12-28 NOTE — Progress Notes (Signed)
Cardiology Office Note  Date:  12/28/2020   ID:  Mary Rice, DOB [birth year], MRN [id number]  PCP:  Rusty Aus, MD   Chief Complaint  Patient presents with   6 month follow up     Patient c/o shortness of breath with exertion and LE edema with more on the right leg. Medications reviewed by the patient verbally.     HPI:  Mary Rice is a pleasant 87 year old woman  Permanent atrial fibrillation reactive airway disease,  hypertension,  DVT with PE after a patella fracture several years ago Off Eliquis after a fall several months ago Covid infection 2020 who presents for new patient evaluation of her persistent atrial fibrillation, hospital follow-up, acute pulmonary embolism  LOV 05/2020 Presents today with her daughter In follow-up today reports that she has some mild leg swelling Taking indapamide/thiazide diuretic this week,  provided by Dr. Sabra Heck  Covid in 08/2020 Reports symptoms were not severe  Drinks 'lots of chips, soda" Weight trending higher Very sedentary, legs getting weaker, walks with walker No regular exercise program  EKG personally reviewed by myself on todays visit Atrial fibrillation rate ranging 48 to 54 bpm no ST or T wave changes  Admission to hospital March 14, 2020 for pneumonia Admission to the hospital September 13 altered mental status, weakness Hyponatremia, discharged on salt pills  Readmission to the hospital April 03, 2020 after a fall She had taken tramadol, felt loopy Secondary to rib pain leading to CT scan chest Acute PE found on CT scan Possible left atrial bandage thrombus She been off her Eliquis, this was restarted for PE and permanent atrial fibrillation Had slow ventricular rate while in the hospital rates in the 40s but was asymptomatic  Atrial fibrillation dates back several years  PMH:   has a past medical history of DVT (deep venous thrombosis) ([place]), Dysrhythmia, Hiatal hernia, Hyperlipemia, Hypertension, and  Pulmonary embolism ([place]).  PSH:    Past Surgical History:  Procedure Laterality Date   ABDOMINAL HYSTERECTOMY     CATARACT EXTRACTION     CHOLECYSTECTOMY     ESOPHAGOGASTRODUODENOSCOPY N/A 08/19/2015   Procedure: ESOPHAGOGASTRODUODENOSCOPY (EGD);  Surgeon: Mauri Pole, MD;  [facility] ENDOSCOPY;  Service: Endoscopy;  Laterality: N/A;   KYPHOPLASTY N/A 02/26/2020   Procedure: KYPHOPLASTY T11;  Surgeon: Hessie Knows, MD;  Location: [facility] ORS;  Service: Orthopedics;  Laterality: N/A;    Current Outpatient Medications  Medication Sig Dispense Refill   acetaminophen (TYLENOL) 500 MG tablet Take 2 tablets (1,000 mg total) by mouth in the morning, at noon, and at bedtime.     amLODipine (NORVASC) 2.5 MG tablet Take 1 tablet (2.5 mg total) by mouth daily. 30 tablet 3   apixaban (ELIQUIS) 5 MG TABS tablet Take 5 mg by mouth 2 (two) times daily.     Ascorbic Acid (VITAMIN C) 250 MG CHEW Chew 750 mg by mouth daily.      atenolol (TENORMIN) 25 MG tablet Take 0.5 tablets (12.5 mg total) by mouth daily. 15 tablet 3   buPROPion (WELLBUTRIN XL) 150 MG 24 hr tablet Take 150 mg by mouth in the morning.     calcium carbonate (TUMS - DOSED IN MG ELEMENTAL CALCIUM) 500 MG chewable tablet Chew 1 tablet by mouth daily as needed for indigestion or heartburn.     Calcium-Vitamin D-Vitamin K (CALCIUM + D + K PO) Take 1 tablet by mouth 2 (two) times daily.     docusate sodium (COLACE) 100 MG capsule Take  300 mg by mouth daily as needed.      INDAPAMIDE PO Take 0.625 mg by mouth daily.     Multiple Vitamin (MULTIVITAMIN WITH MINERALS) TABS tablet Take 1 tablet by mouth daily.     pantoprazole (PROTONIX) 40 MG tablet Take 40 mg by mouth at bedtime.      polyethylene glycol (MIRALAX / GLYCOLAX) 17 g packet Take 17 g by mouth daily as needed for mild constipation.      pravastatin (PRAVACHOL) 20 MG tablet Take 20 mg by mouth at bedtime.      sodium chloride 1 g tablet Take 1 g by mouth 2 (two) times daily with  a meal.      oxyCODONE (OXY IR/ROXICODONE) 5 MG immediate release tablet Take 1 tablet (5 mg total) by mouth every 4 (four) hours as needed for moderate pain, severe pain or breakthrough pain. (Patient not taking: Reported on 12/28/2020) 16 tablet 0   No current facility-administered medications for this visit.     Allergies:   Biaxin [clarithromycin], Maxzide [hydrochlorothiazide w-triamterene], Penicillins, and Sulfa antibiotics   Social History:  The patient  reports that she has never smoked. She has never used smokeless tobacco. She reports that she does not drink alcohol and does not use drugs.   Family History:   family history includes Heart failure in her mother.    Review of Systems: Review of Systems  Constitutional: Negative.   HENT: Negative.    Respiratory: Negative.    Cardiovascular:  Positive for leg swelling.  Gastrointestinal: Negative.   Musculoskeletal: Negative.        Leg weakness  Neurological: Negative.   Psychiatric/Behavioral: Negative.    All other systems reviewed and are negative.   PHYSICAL EXAM: VS:  BP (!) 150/60 (BP Location: Left Arm, Patient Position: Sitting, Cuff Size: Normal)   Pulse (!) 48   Ht 5\' 8"  (1.727 m)   Wt 200 lb 2 oz (90.8 kg)   SpO2 98%   BMI 30.43 kg/m  , BMI Body mass index is 30.43 kg/m. Constitutional:  oriented to person, place, and time. No distress.  HENT:  Head: Grossly normal Eyes:  no discharge. No scleral icterus.  Neck: No JVD, no carotid bruits  Cardiovascular: Irregularly irregular no murmurs appreciated 1+ pitting lower extremity on the right, trace on the left Pulmonary/Chest: Clear to auscultation bilaterally, no wheezes or rails Abdominal: Soft.  no distension.  no tenderness.  Musculoskeletal: Normal range of motion Neurological:  normal muscle tone. Coordination normal. No atrophy Skin: Skin warm and dry Psychiatric: normal affect, pleasant   Recent Labs: 04/04/2020: ALT 16; BUN 18; Creatinine,  Ser 0.91; Magnesium 1.9; Potassium 4.5; Sodium 130 04/06/2020: Hemoglobin 13.6; Platelets 192    Lipid Panel No results found for: CHOL, HDL, LDLCALC, TRIG    Wt Readings from Last 3 Encounters:  12/28/20 200 lb 2 oz (90.8 kg)  05/10/20 187 lb (84.8 kg)  04/03/20 198 lb (89.8 kg)       ASSESSMENT AND PLAN:  Problem List Items Addressed This Visit       Cardiology Problems   Atrial fibrillation ([place]) - Primary   Relevant Medications   INDAPAMIDE PO   apixaban (ELIQUIS) 5 MG TABS tablet   Essential hypertension   Relevant Medications   INDAPAMIDE PO   apixaban (ELIQUIS) 5 MG TABS tablet   Hyperlipidemia   Relevant Medications   INDAPAMIDE PO   apixaban (ELIQUIS) 5 MG TABS tablet     Other  SOB (shortness of breath)  Permanent atrial fibrillation Slow rate, we will stop atenolol Continue Eliquis 5 twice daily No bleeding  Hyponatremia Eating too many chips, weight up 13 pounds, recommend she cut back on her carbohydrates to get her weight down  Falls Sedentary, no desire to walk or exercise Recommended walking program at home  Leg swelling Dependent edema, feels like lymphedema on the right Unable to exclude exacerbation from amlodipine, we will stop the amlodipine  Essential hypertension Stop atenolol as above, stop amlodipine, start losartan 50 mg daily Daughter will help monitor blood pressure Could stay on the low-dose diuretic    Total encounter time more than 25 minutes  Greater than 50% was spent in counseling and coordination of care with the patient    Signed, Esmond Plants, M.D., Ph.D. [place], [place]

## 2021-01-02 ENCOUNTER — Other Ambulatory Visit: Payer: Self-pay | Admitting: *Deleted

## 2021-01-02 MED ORDER — APIXABAN 5 MG PO TABS: 5.0000 mg | ORAL_TABLET | Freq: Two times a day (BID) | ORAL | 1 refills | Status: DC

## 2021-01-02 NOTE — Telephone Encounter (Signed)
Prescription refill request for Eliquis received. Indication: Atrial fib Last office visit: 12/28/20  T. Gollan Scr: 0.8 on 08/01/20 Age: 87 Weight: 90.8kg   Based on above findings Eliquis 5mg  twice daily is the appropriate dose.  Refill approved.

## 2021-01-16 ENCOUNTER — Other Ambulatory Visit: Payer: Self-pay

## 2021-01-16 MED ORDER — LOSARTAN POTASSIUM 50 MG PO TABS: 50.0000 mg | ORAL_TABLET | Freq: Every day | ORAL | 3 refills | Status: DC

## 2021-02-07 ENCOUNTER — Other Ambulatory Visit: Payer: Self-pay

## 2021-02-07 MED ORDER — INDAPAMIDE 1.25 MG PO TABS: 0.6250 mg | ORAL_TABLET | Freq: Every day | ORAL | 3 refills | Status: AC

## 2021-02-07 NOTE — Telephone Encounter (Signed)
Refill sent indapamide (LOZOL) 1.25 MG tablet Take 0.625 mg daily (half tab)

## 2021-04-23 ENCOUNTER — Other Ambulatory Visit: Payer: Self-pay

## 2021-04-25 ENCOUNTER — Other Ambulatory Visit: Payer: Self-pay

## 2021-04-25 MED ORDER — APIXABAN 5 MG PO TABS: 5.0000 mg | ORAL_TABLET | Freq: Two times a day (BID) | ORAL | 3 refills | Status: AC

## 2021-04-25 MED ORDER — LOSARTAN POTASSIUM 50 MG PO TABS: 50.0000 mg | ORAL_TABLET | Freq: Every day | ORAL | 3 refills | Status: AC

## 2021-04-25 MED ORDER — PRAVASTATIN SODIUM 20 MG PO TABS: 20.0000 mg | ORAL_TABLET | Freq: Every day | ORAL | 3 refills | Status: AC

## 2021-06-05 IMAGING — CR DG ABDOMEN 1V
1 series · 2 of 2 positions shown · non-contrast
Comparison: None.

CLINICAL DATA: Ileus

EXAM:
ABDOMEN - 1 VIEW

[Series 1: dg abd 1 view · 0.14mm/px · 2 of 2 slices shown]
[im 1/2]
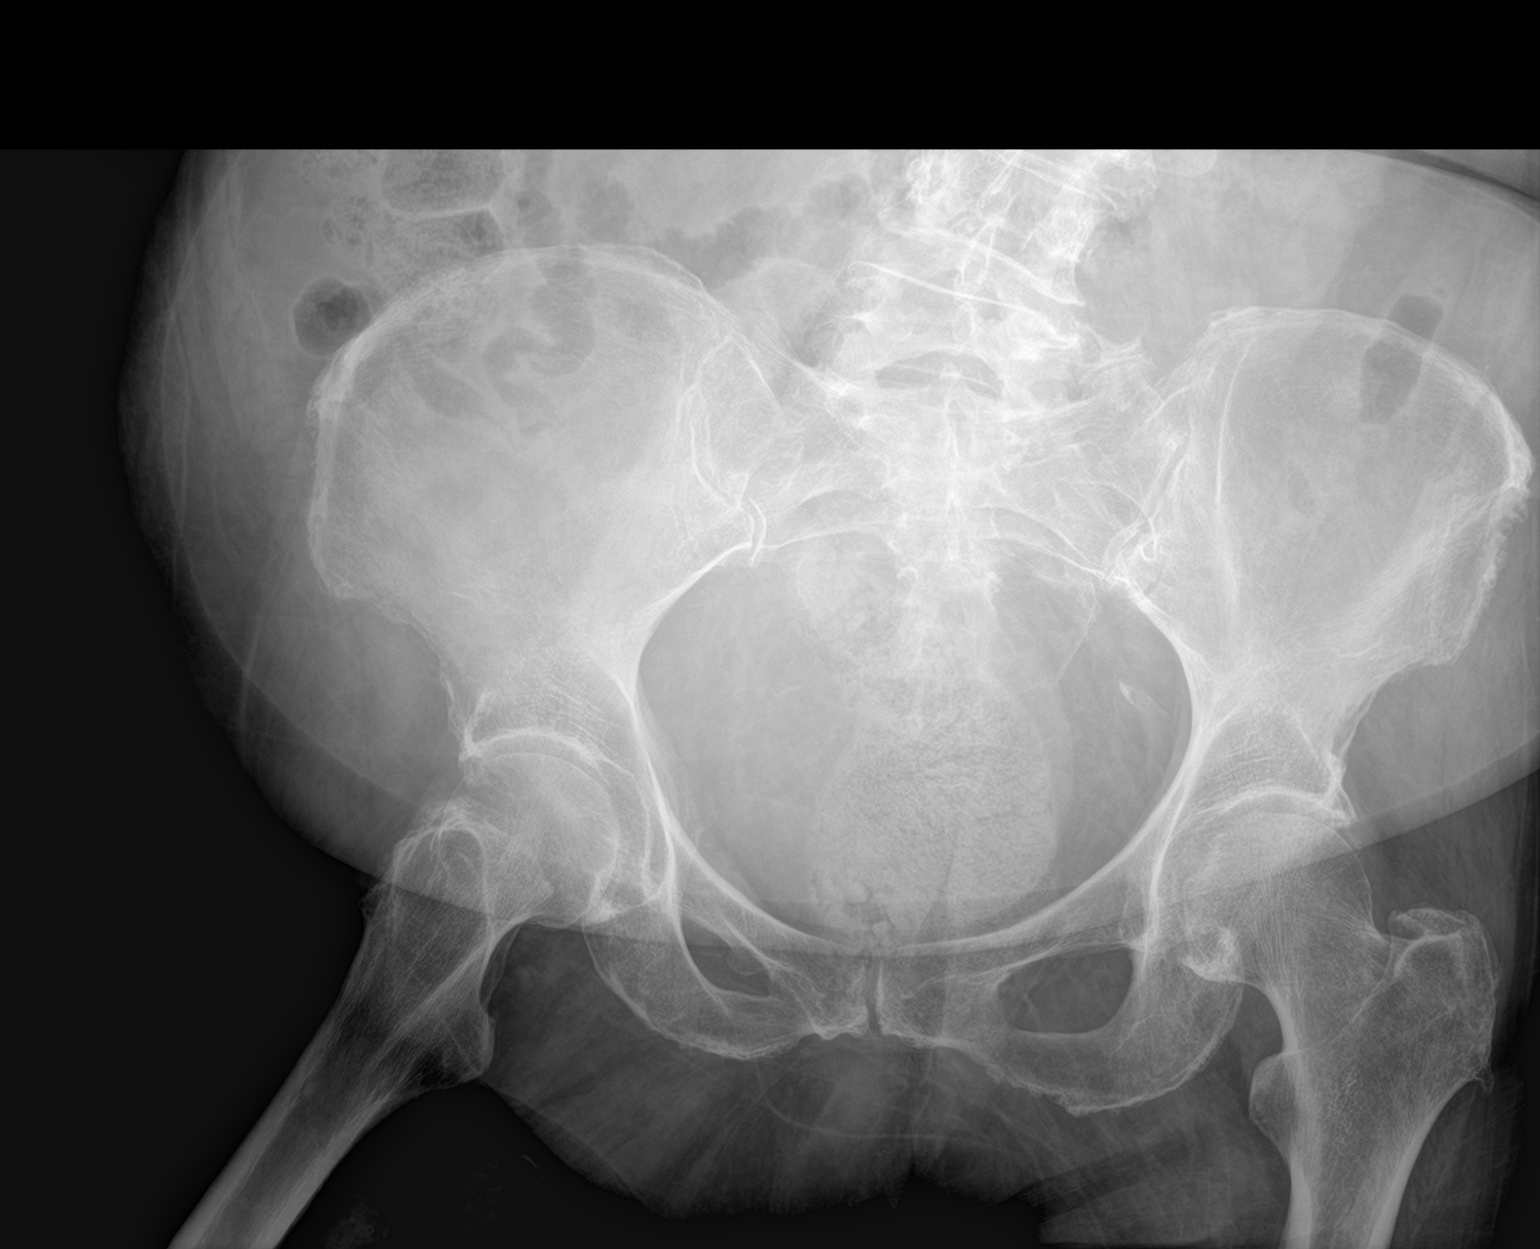
[im 2/2]
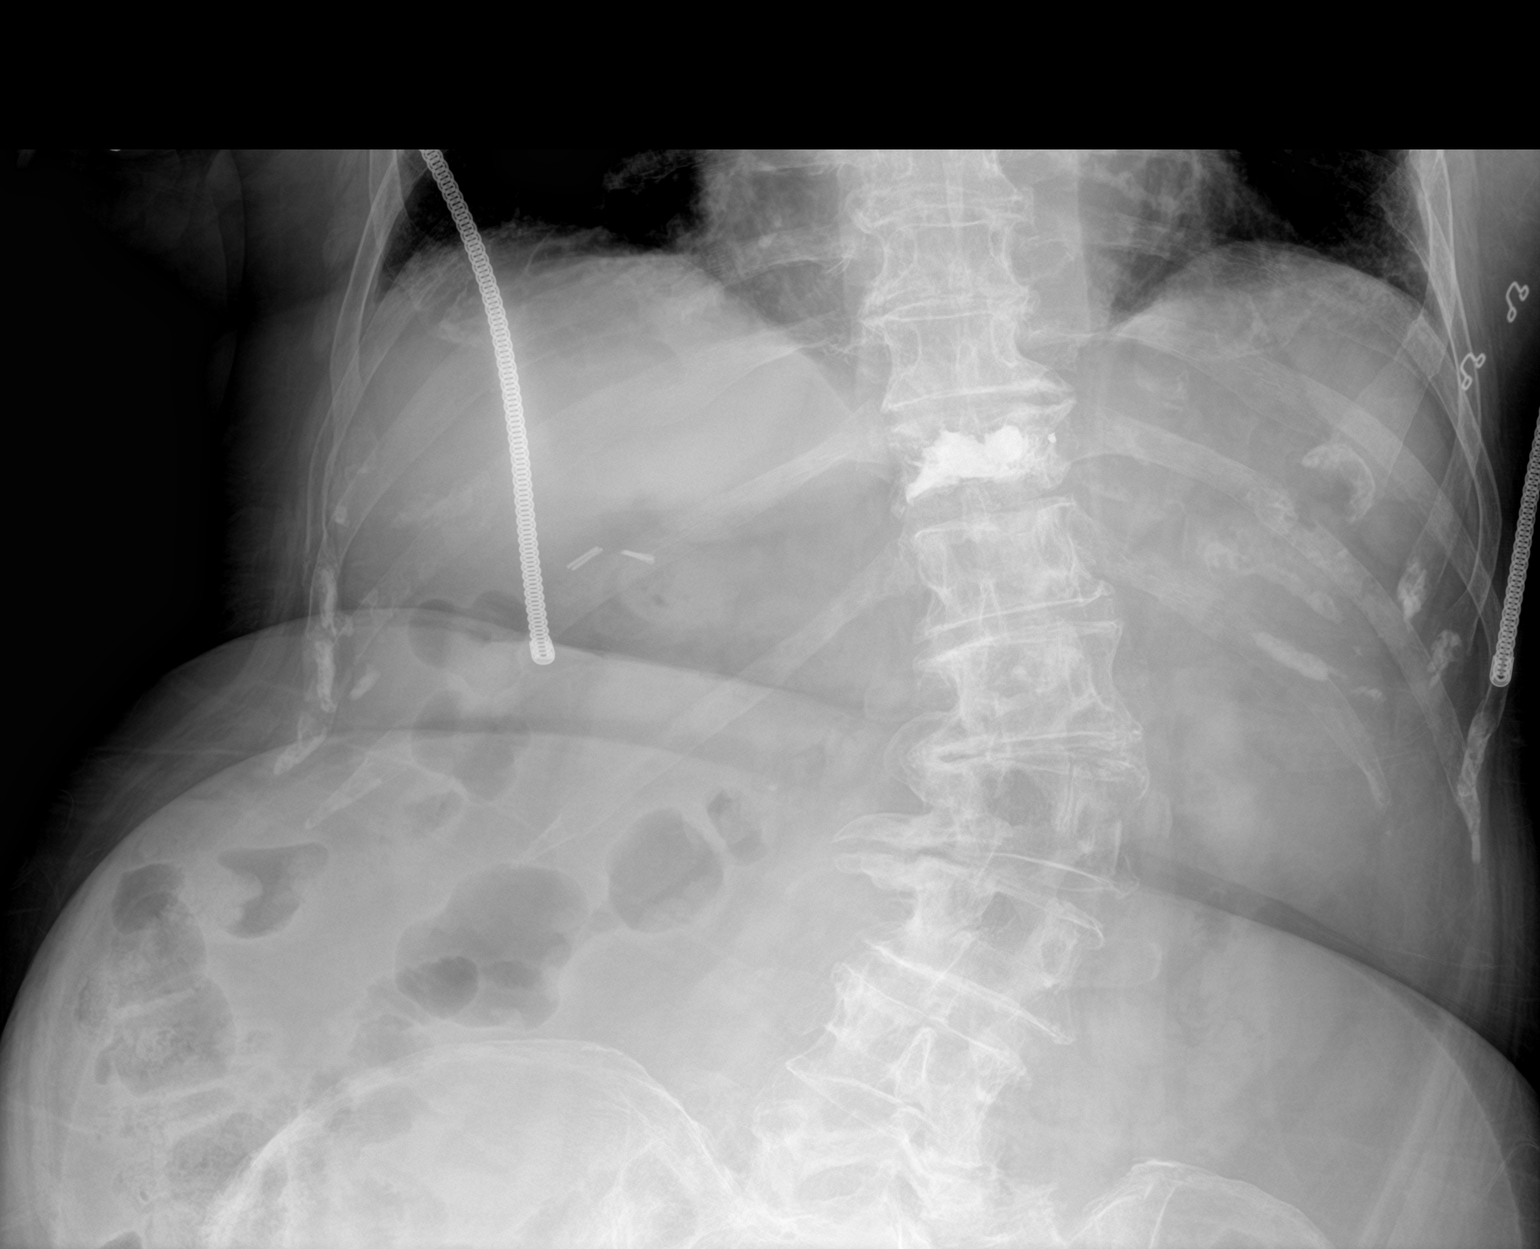

[2 of 2 positions shown; findings below may reference images not displayed]

FINDINGS: Normal abdominal gas pattern. Moderate stool within the rectal
vault. No gross free intraperitoneal gas. Cholecystectomy clips
noted within the right upper quadrant. T11 vertebroplasty has been
performed. Moderate lumbar levoscoliosis. No acute bone abnormality.
IMPRESSION: Moderate stool within the rectal vault. No evidence of bowel
obstruction or free intraperitoneal gas.

## 2021-06-05 IMAGING — CT CT HEAD W/O CM
3 series · 16 of 47 positions shown, 19 images · non-contrast
Comparison: None.

CLINICAL DATA: Altered mental status

EXAM:
CT HEAD WITHOUT CONTRAST
TECHNIQUE: Contiguous axial images were obtained from the base of the skull
through the vertex without intravenous contrast.

[Series 3: head wo · axial · 0.41mm/px · z∈[-162,-27]mm · 10 of 33 slices shown, 13 images]
[im 3/33  brain]
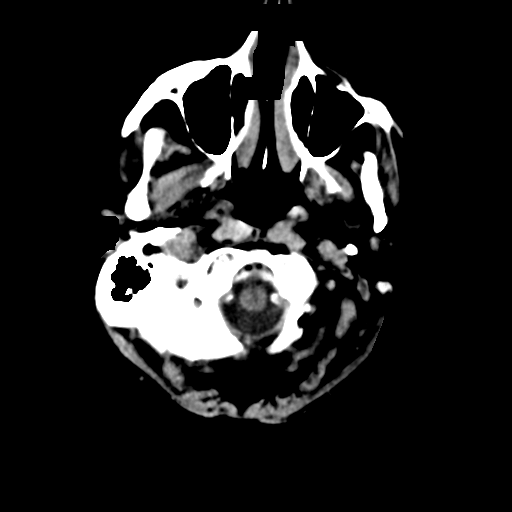
[im 3/33  bone]
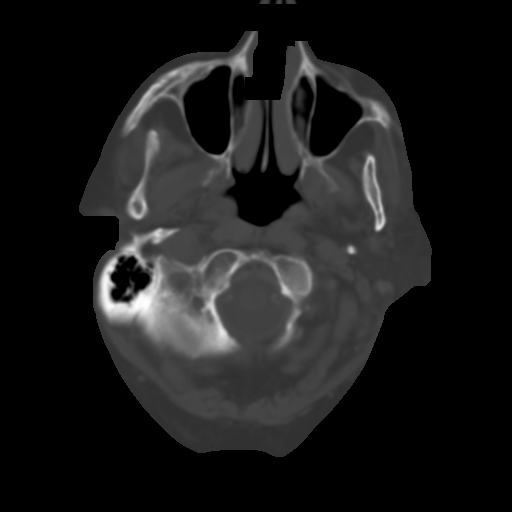
[im 6/33  brain]
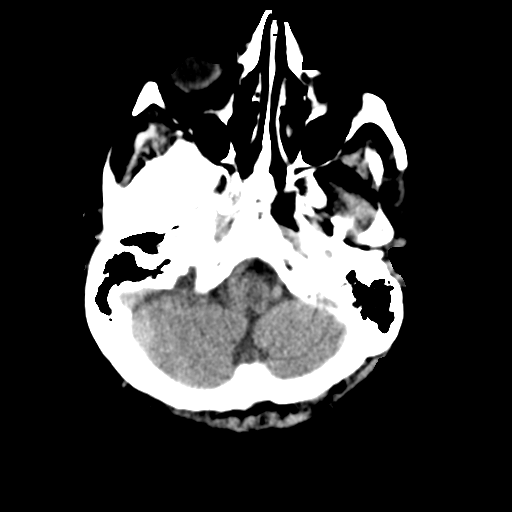
[im 9/33  brain]
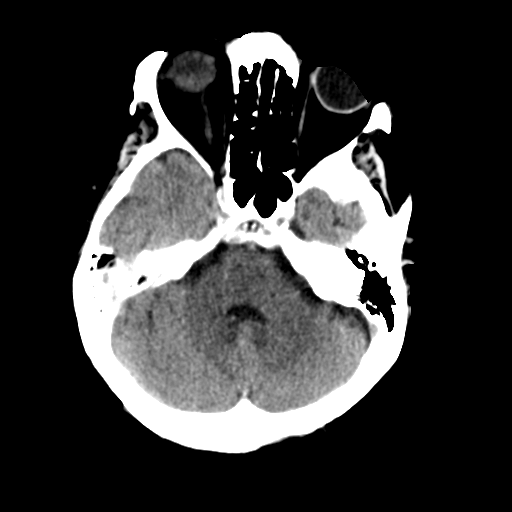
[im 12/33  brain]
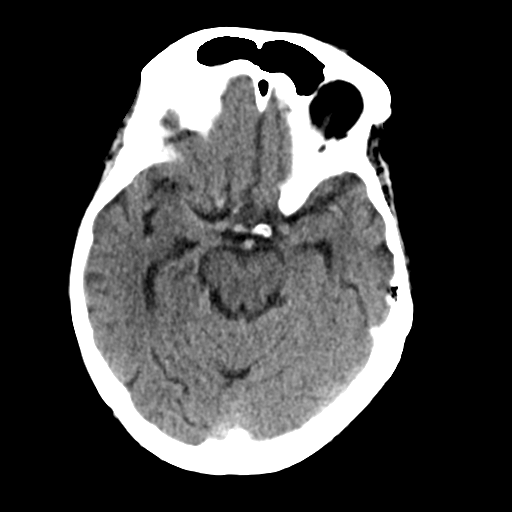
[im 15/33  brain]
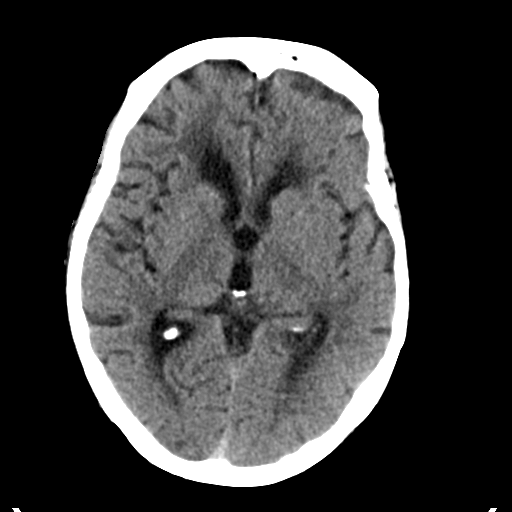
[im 15/33  bone]
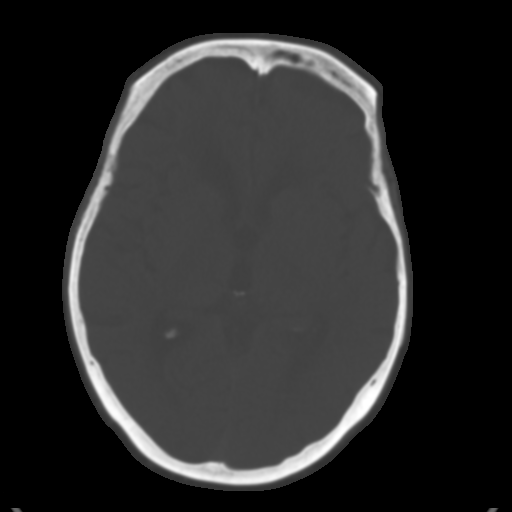
[im 18/33  brain]
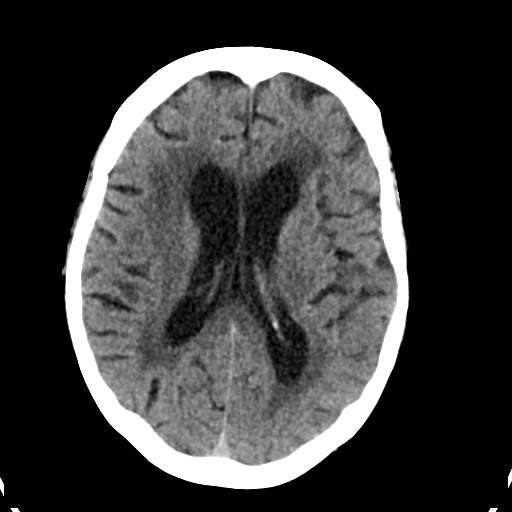
[im 21/33  brain]
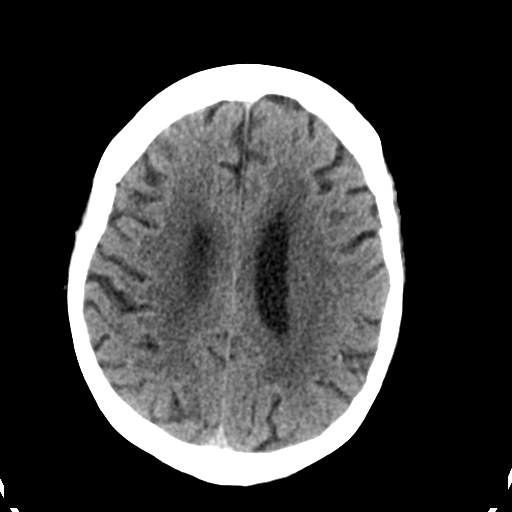
[im 25/33  brain]
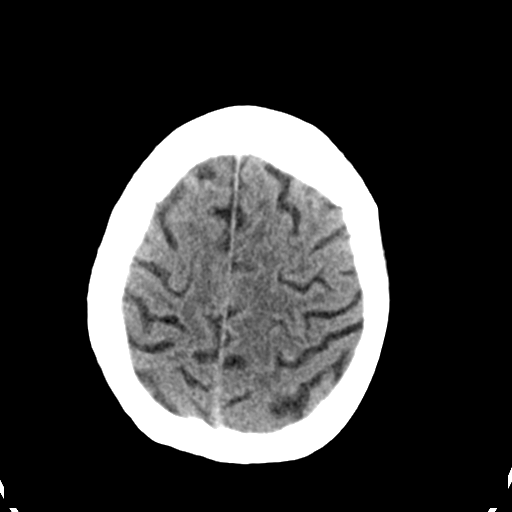
[im 27/33  brain]
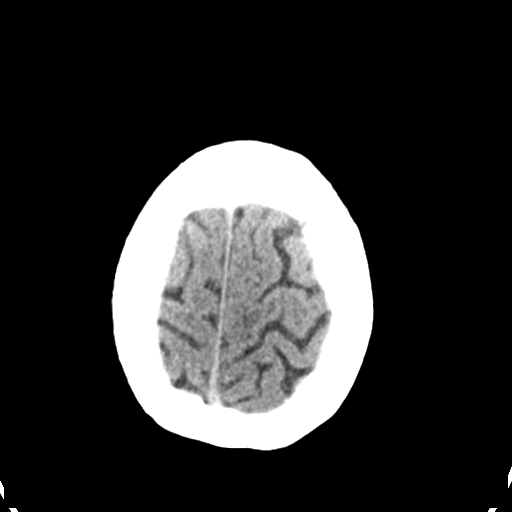
[im 27/33  bone]
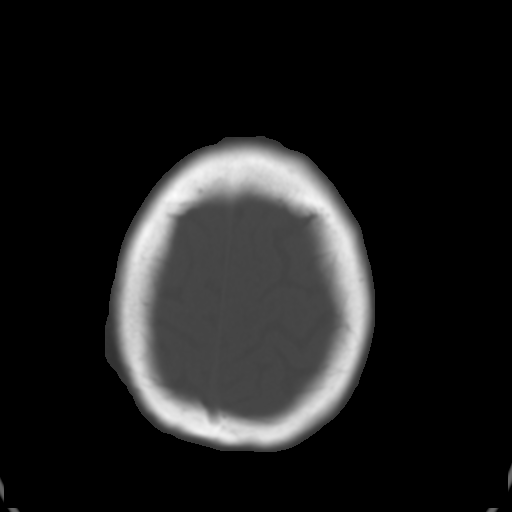
[im 30/33  brain]
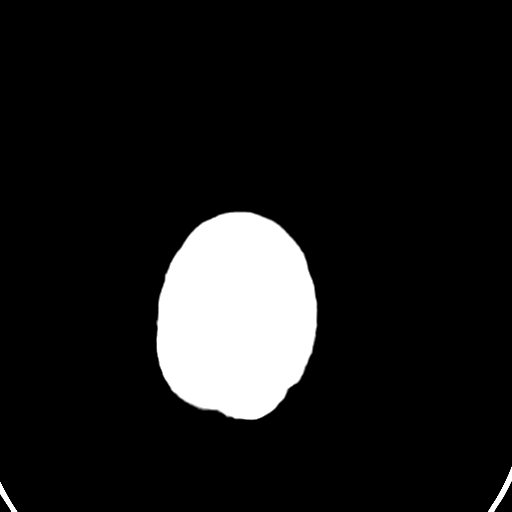

[Series 4: coronal soft tissue · coronal · 0.29mm/px · 3 of 64 slices shown]
[im 24/64  brain]
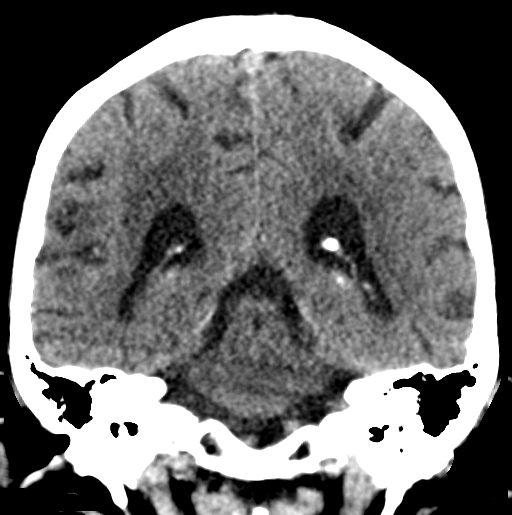
[im 29/64  brain]
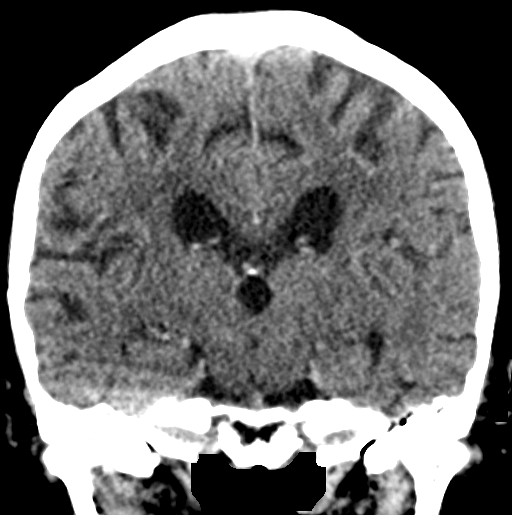
[im 35/64  brain]
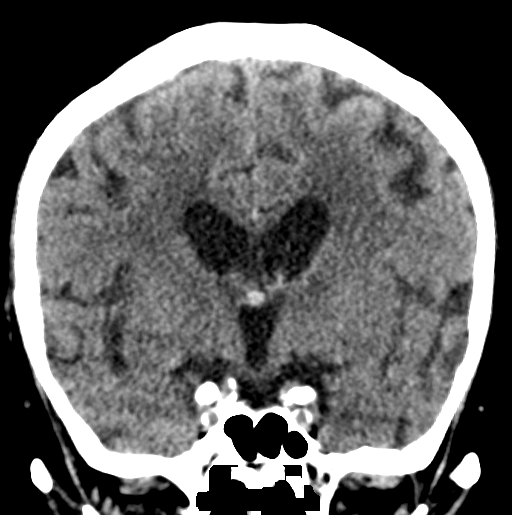

[Series 5: sagittal soft tissue · sagittal · 0.29mm/px · 3 of 50 slices shown]
[im 17/50  brain]
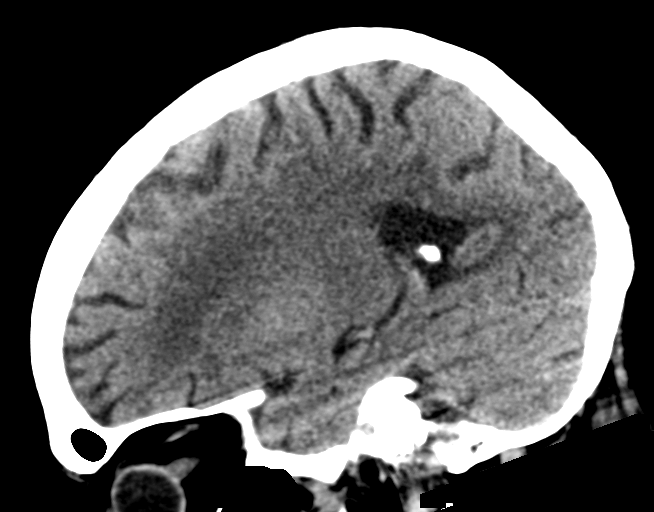
[im 25/50  brain]
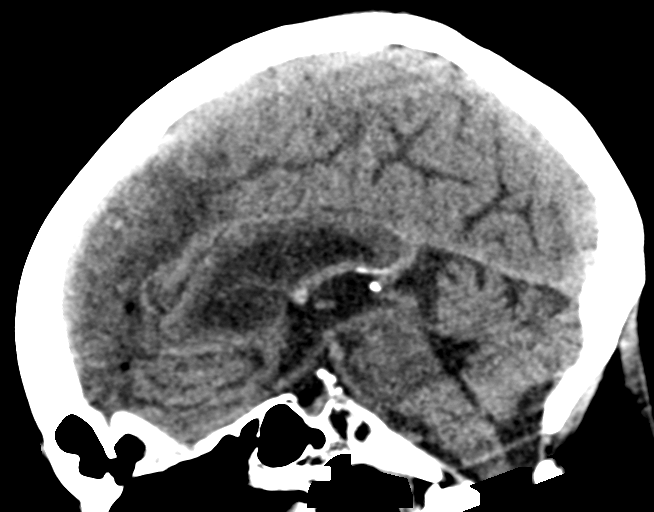
[im 33/50  brain]
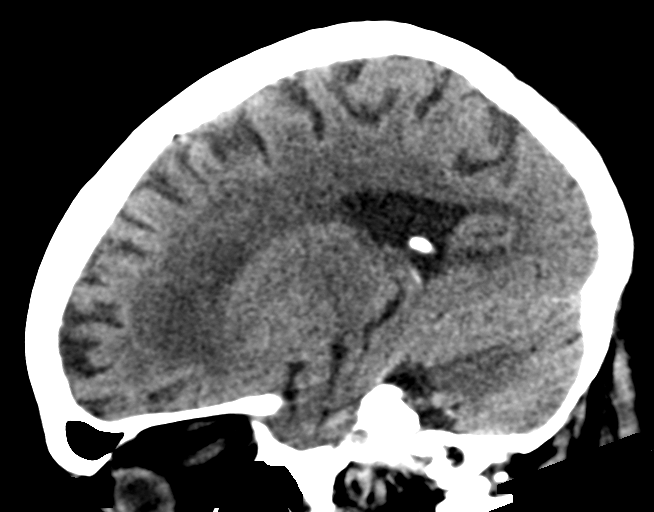

[16 of 47 positions shown; findings below may reference images not displayed]

FINDINGS: Brain: There is atrophy and chronic small vessel disease changes. No
acute intracranial abnormality. Specifically, no hemorrhage,
hydrocephalus, mass lesion, acute infarction, or significant
intracranial injury.

Vascular: No hyperdense vessel or unexpected calcification.

Skull: No acute calvarial abnormality.

Sinuses/Orbits: Visualized paranasal sinuses and mastoids clear.
Orbital soft tissues unremarkable.

Other: None
IMPRESSION: Atrophy, chronic microvascular disease.

No acute intracranial abnormality.

## 2022-01-23 ENCOUNTER — Ambulatory Visit (INDEPENDENT_AMBULATORY_CARE_PROVIDER_SITE_OTHER): Payer: Medicare Other | Admitting: Nurse Practitioner

## 2022-01-23 ENCOUNTER — Encounter: Payer: Self-pay | Admitting: Nurse Practitioner

## 2022-01-23 VITALS — BP 132/62 | HR 70 | Ht 66.5 in | Wt 213.0 lb

## 2022-01-23 DIAGNOSIS — I5032 Chronic diastolic (congestive) heart failure: Secondary | ICD-10-CM

## 2022-01-23 DIAGNOSIS — I10 Essential (primary) hypertension: Secondary | ICD-10-CM

## 2022-01-23 DIAGNOSIS — I4821 Permanent atrial fibrillation: Secondary | ICD-10-CM

## 2022-01-23 DIAGNOSIS — I350 Nonrheumatic aortic (valve) stenosis: Secondary | ICD-10-CM

## 2022-01-23 DIAGNOSIS — E782 Mixed hyperlipidemia: Secondary | ICD-10-CM

## 2022-01-23 NOTE — Progress Notes (Signed)
Office Visit    Patient Name: Mary Rice Date of Encounter: 01/23/2022  Primary Care Provider:  Rusty Aus, MD Primary Cardiologist:  Ida Rogue, MD  Chief Complaint    88 year old female with a history of permanent atrial fibrillation, HFpEF, aortic stenosis, reactive airway disease, hypertension, L atrial appendage thrombus, and PE/DVT following patellar fracture several years ago, who presents for follow-up related to permanent atrial fibrillation.  Past Medical History    Past Medical History:  Diagnosis Date   Aortic stenosis    a. 10/2015 Echo: Mild AS; b. 03/2020 Echo: Mild-mod AS.   Chronic heart failure with preserved ejection fraction (HFpEF) ([place] [facility])    a. 10/2015 Echo: EF 55-60%; b. 03/2020 Echo: EF 65-70%, no rwma, nl RV fxn, RVSP 39.63mHg. Mod TR. Mild-mod AS.   DVT (deep venous thrombosis) ([facility])    Hiatal hernia    Hyperlipemia    Hypertension    Permanent atrial fibrillation ([facility])    a. CHA2DS2VASc = 4-5-->Eliquis.   Pulmonary embolism ([facility]    a. Remote DVT/PE following knee surgery; b. 03/2020 Lobar and RLL PE. LAA Thrombus.   Past Surgical History:  Procedure Laterality Date   ABDOMINAL HYSTERECTOMY     CATARACT EXTRACTION     CHOLECYSTECTOMY     ESOPHAGOGASTRODUODENOSCOPY N/A 08/19/2015   Procedure: ESOPHAGOGASTRODUODENOSCOPY (EGD);  Surgeon: KMauri Pole MD;  [facility];  Service: Endoscopy;  Laterality: N/A;   KYPHOPLASTY N/A 02/26/2020   Procedure: KYPHOPLASTY T11;  Surgeon: MHessie Knows MD;  Location: [facility] ORS;  Service: Orthopedics;  Laterality: N/A;    Allergies  Allergies  Allergen Reactions   Amlodipine Swelling   Biaxin [Clarithromycin] Other (See Comments)    Reaction unknown   Maxzide [Hydrochlorothiazide W-Triamterene] Other (See Comments)    Reaction unknown   Penicillins Rash   Sulfa Antibiotics Rash    History of Present Illness    88year old female with a history of permanent atrial fibrillation,  HFpEF, aortic stenosis, reactive airway disease, hypertension, L atrial appendage thrombus, PE/DVT following patellar fracture several years ago, and more recent PE in September 2021, for which she was placed back on Eliquis.  Left atrial appendage thrombus was also noted on CT at that time.  She was last seen in cardiology clinic in June 2022, at which time she was having lower extremity swelling and hypertension.  Atenolol and amlodipine were discontinued in the setting of bradycardia and lower extremity swelling, and she was placed on losartan 50 mg daily.  Her daughter has since been following her blood pressure 3 times a week at home and notes that she is typically in the 120s over 70s.  Over the past year, [name] has felt well.  Activity is fairly limited in the setting of unsteady gait requiring a cane at home and a walker for longer distances.  She denies chest pain or dyspnea and since coming off of amlodipine, lower extremity swelling has more or less resolved.  She has been getting blood work about every 6 months at her primary care provider's office and labs were stable back in February.  She is due for repeat lab work in August.  She denies palpitations, PND, orthopnea, dizziness, syncope, or early satiety.  Home Medications    Current Outpatient Medications  Medication Sig Dispense Refill   acetaminophen (TYLENOL) 500 MG tablet Take 2 tablets (1,000 mg total) by mouth in the morning, at noon, and at bedtime. (Patient taking differently: Take 1,000 mg by  mouth as needed for moderate pain or fever.)     apixaban (ELIQUIS) 5 MG TABS tablet Take 1 tablet (5 mg total) by mouth 2 (two) times daily. 180 tablet 3   Ascorbic Acid (VITAMIN C) 250 MG CHEW Chew 750 mg by mouth daily.      buPROPion (WELLBUTRIN XL) 150 MG 24 hr tablet Take 150 mg by mouth in the morning.     calcium carbonate (TUMS - DOSED IN MG ELEMENTAL CALCIUM) 500 MG chewable tablet Chew 1 tablet by mouth daily as needed for  indigestion or heartburn.     calcium-vitamin D (OSCAL WITH D) 500-5 MG-MCG tablet Take 1 tablet by mouth daily.     docusate sodium (COLACE) 100 MG capsule Take 300 mg by mouth daily.     indapamide (LOZOL) 1.25 MG tablet Take 0.5 tablets (0.625 mg total) by mouth daily. 45 tablet 3   losartan (COZAAR) 50 MG tablet Take 1 tablet (50 mg total) by mouth daily. 90 tablet 3   Multiple Vitamin (MULTIVITAMIN WITH MINERALS) TABS tablet Take 1 tablet by mouth daily.     pantoprazole (PROTONIX) 40 MG tablet Take 40 mg by mouth at bedtime.      polyethylene glycol (MIRALAX / GLYCOLAX) 17 g packet Take 17 g by mouth daily as needed for mild constipation.      pravastatin (PRAVACHOL) 20 MG tablet Take 1 tablet (20 mg total) by mouth at bedtime. 90 tablet 3   Probiotic Product (PROBIOTIC & ACIDOPHILUS EX ST) CAPS Take by mouth daily.     sodium chloride 1 g tablet Take 1 g by mouth 2 (two) times daily with a meal.      No current facility-administered medications for this visit.     Review of Systems    Activity is limited but overall, she is doing well.  She denies chest pain, dyspnea, palpitations, PND, orthopnea, dizziness, syncope, edema, or early satiety.  All other systems reviewed and are otherwise negative except as noted above.    Physical Exam    VS:  BP 132/62   Pulse 70   Ht 5' 6.5" (1.689 m)   Wt 213 lb (96.6 kg)   SpO2 97%   BMI 33.86 kg/m  , BMI Body mass index is 33.86 kg/m.     Vitals:   01/23/22 1339 01/23/22 1405  BP: (!) 142/68 132/62  Pulse: 70   SpO2: 97%     GEN: Well nourished, well developed, in no acute distress. HEENT: normal. Neck: Supple, no JVD, carotid bruits, or masses. Cardiac: Irregularly irregular, 2 out of 6 systolic ejection murmur heard throughout but loudest at the upper sternal borders.  No gallops or rubs. No clubbing, cyanosis, trace bilateral ankle edema.  Radials/PT 2+ and equal bilaterally.  Respiratory:  Respirations regular and unlabored,  clear to auscultation bilaterally. GI: Soft, nontender, nondistended, BS + x 4. MS: no deformity or atrophy. Skin: warm and dry, no rash. Neuro:  Strength and sensation are intact. Psych: Normal affect.  Accessory Clinical Findings    ECG personally reviewed by me today -atrial fibrillation, 70- no acute changes.  Lab work dated August 24, 2021 from [facility])  Hemoglobin 13.8, hematocrit 42.8, WBC 5.4, platelets 209 Sodium 138, potassium 4.5, chloride 101, CO2 30.1, BUN 9, creatinine 0.9, glucose 152 Total bilirubin 0.6, alkaline phosphatase 71, AST 16, ALT 9 albumin 3.7, calcium 9.2, total protein 6.0 Total cholesterol 197, triglycerides 89, HDL 60.8, LDL 118 Hemoglobin A1c 6.1 TSH  1.554  Assessment & Plan    1.  Permanent atrial fibrillation: She remains well rate controlled in the absence of AV nodal blocking agent.  Atenolol was discontinued in 2022 secondary to bradycardia.  She remains asymptomatic and is anticoagulated with Eliquis 5 mg twice daily.  2.  Chronic HFpEF/lower extremity edema: Patient has been feeling well and is relatively euvolemic on examination today with significant improvement in lower extremity edema following discontinuation of amlodipine.  Blood pressure was initially elevated but better on repeat.  She has been trending in the 120s at home per her daughter.  Heart rate well controlled.  No medication changes today.  3.  Aortic stenosis: Mild to moderate aortic stenosis noted on echo in September 2021.  She denies dyspnea, chest pain, or presyncope/syncope.  Consider repeat evaluation next year.  4.  Essential hypertension: Pressures have been trending in the 120s at home.  She was initially 142/68 today but on repeat, down to 132/62.  She remains on losartan 50 mg daily and her daughter will continue to check her blood pressure 3 times a week at home.  5.  Hyperlipidemia: She is on pravastatin therapy.  LDL of 118 earlier this year.  LFTs  within normal limits.  Given advanced age, will not escalate statin dosing any further at this time.  6.  Disposition: Follow-up in clinic in 1 year or sooner if necessary.   Murray Hodgkins, NP 01/23/2022, 2:14 PM

## 2022-01-23 NOTE — Patient Instructions (Signed)
Medication Instructions:   Your physician recommends that you continue on your current medications as directed. Please refer to the Current Medication list given to you today.  *If you need a refill on your cardiac medications before your next appointment, please call your pharmacy*   Follow-Up: At [facility], you and your health needs are our priority.  As part of our continuing mission to provide you with exceptional heart care, we have created designated Provider Care Teams.  These Care Teams include your primary Cardiologist (physician) and Advanced Practice Providers (APPs -  Physician Assistants and Nurse Practitioners) who all work together to provide you with the care you need, when you need it.  We recommend signing up for the patient portal called "MyChart".  Sign up information is provided on this After Visit Summary.  MyChart is used to connect with patients for Virtual Visits (Telemedicine).  Patients are able to view lab/test results, encounter notes, upcoming appointments, etc.  Non-urgent messages can be sent to your provider as well.   To learn more about what you can do with MyChart, go to [email or web address].    Your next appointment:   1 year(s)  The format for your next appointment:   In Person  Provider:   Ida Rogue, MD    Other Instructions   Important Information About Sugar

## 2023-02-26 ENCOUNTER — Other Ambulatory Visit: Payer: Self-pay | Admitting: Internal Medicine

## 2023-02-26 ENCOUNTER — Ambulatory Visit
Admission: RE | Admit: 2023-02-26 | Discharge: 2023-02-26 | Disposition: A | Discharge status: Home / Self Care | Payer: Medicare Other | Source: Ambulatory Visit | Attending: Internal Medicine | Admitting: Internal Medicine

## 2023-02-26 ENCOUNTER — Encounter: Payer: Self-pay | Admitting: Cardiovascular Disease

## 2023-02-26 DIAGNOSIS — S32010A Wedge compression fracture of first lumbar vertebra, initial encounter for closed fracture: Secondary | ICD-10-CM | POA: Insufficient documentation
